# Patient Record
Sex: Male | Born: 1945 | Race: White | Hispanic: No | Marital: Married | Smoking: Light tobacco smoker
Health system: Southern US, Community
[De-identification: ages and names within clinical notes are randomized; demographics above are authoritative.]

## PROBLEM LIST (undated history)

## (undated) DIAGNOSIS — K3189 Other diseases of stomach and duodenum: Secondary | ICD-10-CM

## (undated) DIAGNOSIS — F32A Depression, unspecified: Secondary | ICD-10-CM

## (undated) DIAGNOSIS — R413 Other amnesia: Secondary | ICD-10-CM

## (undated) DIAGNOSIS — H6690 Otitis media, unspecified, unspecified ear: Secondary | ICD-10-CM

## (undated) DIAGNOSIS — C61 Malignant neoplasm of prostate: Secondary | ICD-10-CM

## (undated) DIAGNOSIS — J309 Allergic rhinitis, unspecified: Secondary | ICD-10-CM

## (undated) DIAGNOSIS — F329 Major depressive disorder, single episode, unspecified: Secondary | ICD-10-CM

## (undated) DIAGNOSIS — R1013 Epigastric pain: Secondary | ICD-10-CM

## (undated) DIAGNOSIS — Z8719 Personal history of other diseases of the digestive system: Secondary | ICD-10-CM

## (undated) DIAGNOSIS — I359 Nonrheumatic aortic valve disorder, unspecified: Secondary | ICD-10-CM

## (undated) DIAGNOSIS — E162 Hypoglycemia, unspecified: Secondary | ICD-10-CM

## (undated) DIAGNOSIS — K219 Gastro-esophageal reflux disease without esophagitis: Secondary | ICD-10-CM

## (undated) DIAGNOSIS — I350 Nonrheumatic aortic (valve) stenosis: Secondary | ICD-10-CM

## (undated) DIAGNOSIS — F419 Anxiety disorder, unspecified: Secondary | ICD-10-CM

## (undated) DIAGNOSIS — D649 Anemia, unspecified: Secondary | ICD-10-CM

## (undated) DIAGNOSIS — L299 Pruritus, unspecified: Secondary | ICD-10-CM

## (undated) DIAGNOSIS — K5900 Constipation, unspecified: Secondary | ICD-10-CM

## (undated) DIAGNOSIS — M4800 Spinal stenosis, site unspecified: Secondary | ICD-10-CM

## (undated) DIAGNOSIS — C801 Malignant (primary) neoplasm, unspecified: Secondary | ICD-10-CM

## (undated) HISTORY — DX: Pruritus, unspecified: L29.9

## (undated) HISTORY — DX: Other amnesia: R41.3

## (undated) HISTORY — DX: Anxiety disorder, unspecified: F41.9

## (undated) HISTORY — DX: Other diseases of stomach and duodenum: K31.89

## (undated) HISTORY — DX: Spinal stenosis, site unspecified: M48.00

## (undated) HISTORY — DX: Hypoglycemia, unspecified: E16.2

## (undated) HISTORY — DX: Gastro-esophageal reflux disease without esophagitis: K21.9

## (undated) HISTORY — DX: Allergic rhinitis, unspecified: J30.9

## (undated) HISTORY — PX: PROSTATE SURGERY: SHX751

## (undated) HISTORY — DX: Constipation, unspecified: K59.00

## (undated) HISTORY — DX: Major depressive disorder, single episode, unspecified: F32.9

## (undated) HISTORY — DX: Nonrheumatic aortic valve disorder, unspecified: I35.9

## (undated) HISTORY — DX: Depression, unspecified: F32.A

## (undated) HISTORY — DX: Epigastric pain: R10.13

## (undated) HISTORY — DX: Otitis media, unspecified, unspecified ear: H66.90

---

## 2002-02-26 ENCOUNTER — Encounter: Payer: Self-pay | Admitting: Urology

## 2002-02-26 ENCOUNTER — Encounter: Admission: RE | Admit: 2002-02-26 | Discharge: 2002-02-26 | Payer: Self-pay | Admitting: Urology

## 2006-01-12 ENCOUNTER — Encounter: Admission: RE | Admit: 2006-01-12 | Discharge: 2006-01-12 | Payer: Self-pay | Admitting: Urology

## 2007-09-18 ENCOUNTER — Ambulatory Visit (HOSPITAL_COMMUNITY): Admission: RE | Admit: 2007-09-18 | Discharge: 2007-09-18 | Payer: Self-pay | Admitting: Urology

## 2008-09-17 ENCOUNTER — Ambulatory Visit (HOSPITAL_COMMUNITY): Admission: RE | Admit: 2008-09-17 | Discharge: 2008-09-17 | Payer: Self-pay | Admitting: Urology

## 2008-10-18 ENCOUNTER — Ambulatory Visit: Payer: Self-pay | Admitting: Diagnostic Radiology

## 2008-10-18 ENCOUNTER — Emergency Department (HOSPITAL_BASED_OUTPATIENT_CLINIC_OR_DEPARTMENT_OTHER): Admission: EM | Admit: 2008-10-18 | Discharge: 2008-10-18 | Payer: Self-pay | Admitting: Emergency Medicine

## 2009-06-06 ENCOUNTER — Emergency Department (HOSPITAL_BASED_OUTPATIENT_CLINIC_OR_DEPARTMENT_OTHER): Admission: EM | Admit: 2009-06-06 | Discharge: 2009-06-06 | Payer: Self-pay | Admitting: Emergency Medicine

## 2009-06-06 ENCOUNTER — Ambulatory Visit: Payer: Self-pay | Admitting: Diagnostic Radiology

## 2010-01-27 ENCOUNTER — Encounter
Admission: RE | Admit: 2010-01-27 | Discharge: 2010-01-27 | Payer: Self-pay | Source: Home / Self Care | Attending: Family Medicine | Admitting: Family Medicine

## 2010-05-03 LAB — POCT CARDIAC MARKERS
Myoglobin, poc: 52 ng/mL (ref 12–200)
Troponin i, poc: <0.05 ng/mL (ref 0.00–0.09)

## 2010-05-03 LAB — CBC
HCT: 36.4 % — ABNORMAL LOW (ref 39.0–52.0)
MCV: 92.5 fL (ref 78.0–100.0)
Platelets: 201 10*3/uL (ref 150–400)
WBC: 7.8 10*3/uL (ref 4.0–10.5)

## 2010-05-03 LAB — BASIC METABOLIC PANEL
BUN: 16 mg/dL (ref 6–23)
CO2: 26 mEq/L (ref 19–32)
Chloride: 106 mEq/L (ref 96–112)
Glucose, Bld: 99 mg/dL (ref 70–99)
Potassium: 4.2 mEq/L (ref 3.5–5.1)

## 2010-05-03 LAB — DIFFERENTIAL
Eosinophils Absolute: 0.2 10*3/uL (ref 0.0–0.7)
Eosinophils Relative: 2 % (ref 0–5)
Lymphs Abs: 1.8 10*3/uL (ref 0.7–4.0)

## 2010-05-20 LAB — DIFFERENTIAL
Basophils Relative: 0 % (ref 0–1)
Eosinophils Absolute: 0 10*3/uL (ref 0.0–0.7)
Lymphs Abs: 1.4 10*3/uL (ref 0.7–4.0)
Monocytes Absolute: 0.6 10*3/uL (ref 0.1–1.0)
Monocytes Relative: 7 % (ref 3–12)
Neutro Abs: 6.3 10*3/uL (ref 1.7–7.7)

## 2010-05-20 LAB — CBC
Platelets: 205 10*3/uL (ref 150–400)
RDW: 12 % (ref 11.5–15.5)

## 2010-05-20 LAB — COMPREHENSIVE METABOLIC PANEL
ALT: 32 U/L (ref 0–53)
Albumin: 4.5 g/dL (ref 3.5–5.2)
Alkaline Phosphatase: 117 U/L (ref 39–117)
Potassium: 4 mEq/L (ref 3.5–5.1)
Sodium: 141 mEq/L (ref 135–145)
Total Protein: 7.5 g/dL (ref 6.0–8.3)

## 2010-05-20 LAB — URINALYSIS, ROUTINE W REFLEX MICROSCOPIC
Ketones, ur: NEGATIVE mg/dL
Nitrite: NEGATIVE
Protein, ur: NEGATIVE mg/dL
Urobilinogen, UA: 0.2 mg/dL (ref 0.0–1.0)

## 2010-05-22 LAB — CREATININE, SERUM: Creatinine, Ser: 0.83 mg/dL (ref 0.4–1.5)

## 2010-06-08 ENCOUNTER — Other Ambulatory Visit: Payer: Self-pay | Admitting: Urology

## 2010-06-08 DIAGNOSIS — C61 Malignant neoplasm of prostate: Secondary | ICD-10-CM

## 2010-06-23 ENCOUNTER — Encounter (HOSPITAL_COMMUNITY)
Admission: RE | Admit: 2010-06-23 | Discharge: 2010-06-23 | Disposition: A | Discharge status: Home / Self Care | Payer: Medicare Other | Source: Ambulatory Visit | Attending: Urology | Admitting: Urology

## 2010-06-23 ENCOUNTER — Encounter (HOSPITAL_COMMUNITY): Payer: Self-pay

## 2010-06-23 DIAGNOSIS — C61 Malignant neoplasm of prostate: Secondary | ICD-10-CM | POA: Insufficient documentation

## 2010-06-23 DIAGNOSIS — R972 Elevated prostate specific antigen [PSA]: Secondary | ICD-10-CM | POA: Insufficient documentation

## 2010-06-23 DIAGNOSIS — R32 Unspecified urinary incontinence: Secondary | ICD-10-CM | POA: Insufficient documentation

## 2010-06-23 HISTORY — DX: Malignant (primary) neoplasm, unspecified: C80.1

## 2010-06-23 MED ORDER — TECHNETIUM TC 99M MEDRONATE IV KIT
23.9000 | PACK | Freq: Once | INTRAVENOUS | Status: AC | PRN
  Administered 2010-06-23: 23.9 via INTRAVENOUS

## 2011-04-14 ENCOUNTER — Other Ambulatory Visit: Payer: Self-pay | Admitting: Urology

## 2011-04-14 DIAGNOSIS — C61 Malignant neoplasm of prostate: Secondary | ICD-10-CM

## 2011-05-11 ENCOUNTER — Encounter (HOSPITAL_COMMUNITY)
Admission: RE | Admit: 2011-05-11 | Discharge: 2011-05-11 | Disposition: A | Discharge status: Home / Self Care | Payer: Medicare Other | Source: Ambulatory Visit | Attending: Urology | Admitting: Urology

## 2011-05-11 DIAGNOSIS — C61 Malignant neoplasm of prostate: Secondary | ICD-10-CM

## 2011-05-11 MED ORDER — TECHNETIUM TC 99M MEDRONATE IV KIT
25.0000 | PACK | Freq: Once | INTRAVENOUS | Status: AC | PRN
  Administered 2011-05-11: 25 via INTRAVENOUS

## 2012-01-04 ENCOUNTER — Other Ambulatory Visit: Payer: Self-pay | Admitting: Urology

## 2012-01-04 DIAGNOSIS — C61 Malignant neoplasm of prostate: Secondary | ICD-10-CM

## 2012-02-29 ENCOUNTER — Encounter (HOSPITAL_COMMUNITY)
Admission: RE | Admit: 2012-02-29 | Discharge: 2012-02-29 | Disposition: A | Discharge status: Home / Self Care | Payer: Medicare Other | Source: Ambulatory Visit | Attending: Urology | Admitting: Urology

## 2012-02-29 DIAGNOSIS — C61 Malignant neoplasm of prostate: Secondary | ICD-10-CM | POA: Insufficient documentation

## 2012-02-29 MED ORDER — TECHNETIUM TC 99M MEDRONATE IV KIT
25.2000 | PACK | Freq: Once | INTRAVENOUS | Status: AC | PRN
  Administered 2012-02-29: 25.2 via INTRAVENOUS

## 2012-12-03 ENCOUNTER — Other Ambulatory Visit: Payer: Self-pay | Admitting: Family Medicine

## 2012-12-03 ENCOUNTER — Encounter: Payer: Self-pay | Admitting: Interventional Cardiology

## 2012-12-03 ENCOUNTER — Encounter: Payer: Self-pay | Admitting: *Deleted

## 2012-12-03 DIAGNOSIS — F32A Depression, unspecified: Secondary | ICD-10-CM | POA: Insufficient documentation

## 2012-12-03 DIAGNOSIS — Z0389 Encounter for observation for other suspected diseases and conditions ruled out: Secondary | ICD-10-CM

## 2012-12-03 DIAGNOSIS — K219 Gastro-esophageal reflux disease without esophagitis: Secondary | ICD-10-CM | POA: Insufficient documentation

## 2012-12-03 DIAGNOSIS — C801 Malignant (primary) neoplasm, unspecified: Secondary | ICD-10-CM | POA: Insufficient documentation

## 2012-12-03 DIAGNOSIS — H538 Other visual disturbances: Secondary | ICD-10-CM

## 2012-12-03 DIAGNOSIS — H6690 Otitis media, unspecified, unspecified ear: Secondary | ICD-10-CM | POA: Insufficient documentation

## 2012-12-03 DIAGNOSIS — F329 Major depressive disorder, single episode, unspecified: Secondary | ICD-10-CM | POA: Insufficient documentation

## 2012-12-03 DIAGNOSIS — E162 Hypoglycemia, unspecified: Secondary | ICD-10-CM | POA: Insufficient documentation

## 2012-12-05 ENCOUNTER — Ambulatory Visit (INDEPENDENT_AMBULATORY_CARE_PROVIDER_SITE_OTHER): Payer: Medicare Other | Admitting: Interventional Cardiology

## 2012-12-05 ENCOUNTER — Encounter: Payer: Self-pay | Admitting: Interventional Cardiology

## 2012-12-05 VITALS — BP 128/82 | HR 68 | Ht 74.0 in | Wt 236.0 lb

## 2012-12-05 DIAGNOSIS — I359 Nonrheumatic aortic valve disorder, unspecified: Secondary | ICD-10-CM | POA: Insufficient documentation

## 2012-12-05 DIAGNOSIS — F05 Delirium due to known physiological condition: Secondary | ICD-10-CM

## 2012-12-05 DIAGNOSIS — I6529 Occlusion and stenosis of unspecified carotid artery: Secondary | ICD-10-CM | POA: Insufficient documentation

## 2012-12-05 DIAGNOSIS — Z0389 Encounter for observation for other suspected diseases and conditions ruled out: Secondary | ICD-10-CM

## 2012-12-05 NOTE — Patient Instructions (Addendum)
Your physician has requested that you have an echocardiogram. Echocardiography is a painless test that uses sound waves to create images of your heart. It provides your doctor with information about the size and shape of your heart and how well your heart's chambers and valves are working. This procedure takes approximately one hour. There are no restrictions for this procedure.  Your physician recommends that you schedule a follow-up appointment in: 3-4 weeks with Dr. Eldridge Dace.  Your physician recommends that you continue on your current medications as directed. Please refer to the Current Medication list given to you today.

## 2012-12-05 NOTE — Progress Notes (Signed)
Patient ID: Logan Wolfe, male   DOB: Jan 12, 1946, 67 y.o.   MRN: [id number]    [address] [place], [place]  [address] Phone: [phone] Fax:  [phone]  Date:  12/05/2012   ID:  Logan Wolfe, DOB 14-Sep-1945, MRN [id number]  PCP:   Duane Lope, MD      History of Present Illness: Logan Wolfe is a 67 y.o. male who had transient confusion for 15 minutes.  He could not read certain words.  He had trouble spelling certain words as well.  He saw Dr. Tenny Craw who is sending him for MRI of his brain.  No SHOB, CP, dizziness.  He does have an occasional burning in his chest.  Episodes are shortlived and terminate spontaneously.  His back an hips limit his walking.  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  He lifts boxes and moves inventroy and has no problems with that other than joint and back pains.     Wt Readings from Last 3 Encounters:  12/05/12 236 lb (107.049 kg)     Past Medical History  Diagnosis Date  . Cancer     Prostate CA, Dr Annabell Howells 22 years ago  . GERD (gastroesophageal reflux disease)   . Hypoglycemia   . Depressive disorder   . Otitis     externa of right ear    Current Outpatient Prescriptions  Medication Sig Dispense Refill  . aspirin 81 MG tablet Take 81 mg by mouth daily.      . clopidogrel (PLAVIX) 75 MG tablet Take 75 mg by mouth daily.      . fluticasone (FLONASE) 50 MCG/ACT nasal spray Place into the nose daily.      . hyoscyamine (ANASPAZ) 0.125 MG TBDP tablet Place 0.125 mg under the tongue every 4 (four) hours as needed.      Marland Kitchen ibuprofen (ADVIL,MOTRIN) 200 MG tablet Take 200 mg by mouth every 6 (six) hours as needed for pain.      Marland Kitchen LORazepam (ATIVAN) 1 MG tablet Take 1 mg by mouth every 6 (six) hours as needed for anxiety.      . polyethylene glycol (MIRALAX) packet Take 17 g by mouth daily.       No current facility-administered medications for this visit.    Allergies:    Allergies  Allergen Reactions  . Penicillins      Social History:  The patient  reports that he has quit smoking. His smoking use included Cigarettes. He smoked 0.00 packs per day for 15 years. He does not have any smokeless tobacco history on file.   Family History:  The patient's family history includes Cancer in his father; Diabetes in his mother; Hyperlipidemia in his mother.   ROS:  Please see the history of present illness.  No nausea, vomiting.  No fevers, chills.  No focal weakness.  No dysuria.   All other systems reviewed and negative.   PHYSICAL EXAM: VS:  BP 128/82  Pulse 68  Ht 6\' 2"  (1.88 m)  Wt 236 lb (107.049 kg)  BMI 30.29 kg/m2 Well nourished, well developed, in no acute distress HEENT: normal Neck: no JVD, no carotid bruits Cardiac:  normal S1, S2; RRR; 2/6 early systolic murmur Lungs:  clear to auscultation bilaterally, no wheezing, rhonchi or rales Abd: soft, nontender, no hepatomegaly Ext: no edema Skin: warm and dry Neuro:   no focal abnormalities noted  EKG:  Normal   ASSESSMENT AND PLAN:  1. Aortic stenosis: Mild by prior echocardiogram. Will recheck echo to further evaluate. 2. Transient confusion: Certainly is consistent with some type of neurologic event. If MRI shows evidence of embolism to multiple areas of the brain, would strongly consider transesophageal echocardiogram. 3. Carotid artery disease:   Reviewed carotid Doppler done in 2012. There is mild plaque bilaterally. This seems like an unlikely cause for his TIA symptoms. Will go over results of his MRI and echocardiogram in about 4 weeks.                                                                                                                                                                                                                                                                                                                                                                                                                                                                                         Signed, Fredric Mare, MD, [facility] 12/05/2012 1:01 PM

## 2012-12-12 ENCOUNTER — Ambulatory Visit
Admission: RE | Admit: 2012-12-12 | Discharge: 2012-12-12 | Disposition: A | Discharge status: Home / Self Care | Payer: Medicare Other | Source: Ambulatory Visit | Attending: Family Medicine | Admitting: Family Medicine

## 2012-12-12 DIAGNOSIS — Z0389 Encounter for observation for other suspected diseases and conditions ruled out: Secondary | ICD-10-CM

## 2012-12-12 DIAGNOSIS — H538 Other visual disturbances: Secondary | ICD-10-CM

## 2012-12-20 ENCOUNTER — Other Ambulatory Visit: Payer: Self-pay

## 2012-12-20 ENCOUNTER — Ambulatory Visit (HOSPITAL_COMMUNITY): Payer: Medicare Other | Attending: Cardiology

## 2012-12-20 ENCOUNTER — Other Ambulatory Visit (HOSPITAL_COMMUNITY): Payer: Self-pay | Admitting: Interventional Cardiology

## 2012-12-20 DIAGNOSIS — I359 Nonrheumatic aortic valve disorder, unspecified: Secondary | ICD-10-CM

## 2012-12-24 ENCOUNTER — Telehealth: Payer: Self-pay | Admitting: Interventional Cardiology

## 2012-12-24 NOTE — Telephone Encounter (Signed)
rtc pt call

## 2012-12-24 NOTE — Telephone Encounter (Signed)
New Problem:  Pt states he is returning a phone call. Pt states the person didn't leave their name or any details... Pt would like to know if his nurse was calling him. Please advise

## 2012-12-26 ENCOUNTER — Ambulatory Visit (INDEPENDENT_AMBULATORY_CARE_PROVIDER_SITE_OTHER): Payer: Medicare Other | Admitting: Neurology

## 2012-12-26 ENCOUNTER — Encounter: Payer: Self-pay | Admitting: Neurology

## 2012-12-26 VITALS — BP 124/82 | HR 66 | Ht 74.5 in | Wt 240.0 lb

## 2012-12-26 DIAGNOSIS — G459 Transient cerebral ischemic attack, unspecified: Secondary | ICD-10-CM

## 2012-12-26 NOTE — Patient Instructions (Signed)
Overall you are doing fairly well but I do want to suggest a few things today:   Remember to drink plenty of fluid, eat healthy meals and do not skip any meals. Try to eat protein with a every meal and eat a healthy snack such as fruit or nuts in between meals. Try to keep a regular sleep-wake schedule and try to exercise daily, particularly in the form of walking, 20-30 minutes a day, if you can.   As far as your medications are concerned, I would like to suggest you stay on the aspirin 325mg  daily.   As far as diagnostic testing:  1)We will check your lipids and cholesterol today  We will follow up as needed. I will call to let you know the results of your blood work.   My clinical assistant and will answer any of your questions and relay your messages to me and also relay most of my messages to you.   Our phone number is [phone]. We also have an after hours call service for urgent matters and there is a physician on-call for urgent questions. For any emergencies you know to call 911 or go to the nearest emergency room

## 2012-12-26 NOTE — Progress Notes (Signed)
[facility]    Provider:  Dr Hosie Poisson Referring Provider: Duane Lope, MD Primary Care Physician:   Logan Lope, MD  CC:  Confusion and blurred vision  HPI:  Logan Wolfe is a 67 y.o. male here as a referral from Dr. Tenny Wolfe for evaluation of transient episode of blurred vision and confusion  In October, noted acute onset of confusion, couldn't remember how to do things, unable to think of words, word finding difficulties, trouble getting words out, kept misspelling words, made simple mistakes while working. He knew something was not right. Lasted around 15 minutes. No witnesses to the event.   Has occasional episodes of blurry vision, happens every few months, notes a central vision blurring. This is not followed by a headache. Lasts a few minutes. Believes it is binocular but is unsure what happens if he closes one eye.    No other histories of blacking out, zoning out, no noted automatisms. Feels short term memory is poor but has been a chronic problem, no progression.   No known HTN, DM.   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   Review of Systems: Out of a complete 14 system review, the patient complains of only the following symptoms, and all other reviewed systems are negative. Positive for memory loss confusion numbness depression anxiety allergies itching ringing in ears  History   Social History  . Marital Status: Married    Spouse Name: Logan Wolfe    Number of Children: 2  . Years of Education: 13   Occupational History  .     Social History Main Topics  . Smoking status: Light Tobacco Smoker -- 15 years    Types: Cigars  . Smokeless tobacco: Never Used  . Alcohol Use: Yes  . Drug Use: No  . Sexual Activity: Yes    Partners: Female   Other Topics Concern  . Not on file   Social History Narrative   Patient is married Logan Wolfe) and lives with his wife.   Patient has  2 children and his wife has 2 children.   Patient is semi-retired.   Patient has a college education.   Patient is right-handed.   Patient drinks one cup of coffee daily.    Family History  Problem Relation Age of Onset  . Hyperlipidemia Mother   . Diabetes Mother   . Cancer Father     facial    Past Medical History  Diagnosis Date  . Cancer     Prostate CA, Dr Logan Wolfe 22 years ago  . GERD (gastroesophageal reflux disease)   . Hypoglycemia   . Depressive disorder   . Otitis     externa of right ear  . Anxiety   . Constipation   . Spinal stenosis   . Aortic valve calcification   . Unspecified pruritic disorder   . Dyspepsia and other specified disorders of function of stomach   . Allergic rhinitis, cause unspecified   . Memory loss     Past Surgical History  Procedure Laterality Date  . Prostate surgery      Current Outpatient Prescriptions  Medication Sig Dispense Refill  . aspirin 325 MG tablet Take 325 mg by mouth daily.      . fluticasone (FLONASE) 50 MCG/ACT nasal spray Place into the nose daily.      . hyoscyamine (ANASPAZ) 0.125 MG TBDP tablet Place 0.125 mg under the tongue every 4 (four) hours as  needed.      Marland Kitchen ibuprofen (ADVIL,MOTRIN) 200 MG tablet Take 200 mg by mouth every 6 (six) hours as needed for pain.      Marland Kitchen LORazepam (ATIVAN) 1 MG tablet Take 1 mg by mouth every 6 (six) hours as needed for anxiety.      . polyethylene glycol (MIRALAX) packet Take 17 g by mouth daily.       No current facility-administered medications for this visit.    Allergies as of 12/26/2012 - Review Complete 12/26/2012  Allergen Reaction Noted  . Penicillins  06/23/2010    Vitals: BP 124/82  Pulse 66  Ht 6' 2.5" (1.892 m)  Wt 240 lb (108.863 kg)  BMI 30.41 kg/m2 Last Weight:  Wt Readings from Last 1 Encounters:  12/26/12 240 lb (108.863 kg)   Last Height:   Ht Readings from Last 1 Encounters:  12/26/12 6' 2.5" (1.892 m)     Physical exam: Exam: Gen: NAD,  conversant Eyes: anicteric sclerae, moist conjunctivae HENT: Atraumatic, oropharynx clear Neck: Trachea midline; supple,  Lungs: CTA, no wheezing, rales, rhonic                          CV: RRR, no MRG, no carotid bruits Abdomen: Soft, non-tender;  Extremities: No peripheral edema  Skin: Normal temperature, no rash,  Psych: Appropriate affect, pleasant  Neuro: MS: AA&Ox3, appropriately interactive, normal affect   Speech: fluent w/o paraphasic error  Memory: good recent and remote recall  CN: PERRL, EOMI no nystagmus, no ptosis, sensation intact to LT V1-V3 bilat, face symmetric, no weakness, hearing grossly intact, palate elevates symmetrically, shoulder shrug 5/5 bilat,  tongue protrudes midline, no fasiculations noted.  Motor: normal bulk and tone Strength: 5/5  In all extremities, did not test proximal right upper extremity due to rotator cuff injury  Coord: rapid alternating and point-to-point (FNF, HTS) movements intact, slowed on the right upper extremity do to rotator cuff injury   Reflexes: symmetrical, bilat downgoing toes  Sens: LT intact in all extremities  Gait: posture, stance, stride and arm-swing normal.    Assessment:  After physical and neurologic examination, review of laboratory studies, imaging, neurophysiology testing and pre-existing records, assessment will be reviewed on the problem list.  Plan:  Treatment plan and additional workup will be reviewed under Problem List.  1)Altered mental status  67 year old gentleman and pain for initial evaluation of transient episode of confusion, word finding difficulties. This lasted less than 15 minutes. Has been asymptomatic since then. Also notes brief intermittent episodes of decreased visual acuity with central blurring of bilateral vision. Unclear etiology symptoms. 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 Based on clinical history  would have concern this event represents a transient ischemic attack. Differential would also include atypical migraines and or complex partial seizure. Will check lipid panel. Will continue patient on aspirin 325 mg daily. Will hold off on EEG at this time due to low suspicion of seizure episode. Patient to AS needed

## 2012-12-27 ENCOUNTER — Encounter: Payer: Self-pay | Admitting: Interventional Cardiology

## 2012-12-27 ENCOUNTER — Ambulatory Visit (INDEPENDENT_AMBULATORY_CARE_PROVIDER_SITE_OTHER): Payer: Medicare Other | Admitting: Interventional Cardiology

## 2012-12-27 ENCOUNTER — Other Ambulatory Visit (INDEPENDENT_AMBULATORY_CARE_PROVIDER_SITE_OTHER): Payer: Self-pay

## 2012-12-27 VITALS — BP 130/84 | HR 72 | Ht 74.5 in | Wt 241.0 lb

## 2012-12-27 DIAGNOSIS — I359 Nonrheumatic aortic valve disorder, unspecified: Secondary | ICD-10-CM

## 2012-12-27 DIAGNOSIS — Z01810 Encounter for preprocedural cardiovascular examination: Secondary | ICD-10-CM

## 2012-12-27 DIAGNOSIS — Z0289 Encounter for other administrative examinations: Secondary | ICD-10-CM

## 2012-12-27 DIAGNOSIS — I6789 Other cerebrovascular disease: Secondary | ICD-10-CM | POA: Insufficient documentation

## 2012-12-27 NOTE — Progress Notes (Signed)
Patient ID: Logan Wolfe, male   DOB: November 30, 1945, 67 y.o.   MRN: [id number] Patient ID: Logan Wolfe, male   DOB: 07-26-1945, 67 y.o.   MRN: [id number]    [address] [place], [place]  [address] Phone: [phone] Fax:  [phone]  Date:  12/27/2012   ID:  Logan Wolfe, DOB 1945-12-19, MRN [id number]  PCP:   Duane Lope, MD      History of Present Illness: Logan Wolfe is a 67 y.o. male who had transient confusion for 15 minutes.  He could not read certain words.  He had trouble spelling certain words as well.  He saw Dr. Tenny Craw who is sending him for MRI of his brain.  No SHOB, CP, dizziness.  He does have an occasional burning in his chest.  Episodes are shortlived and terminate spontaneously.  His back an hips limit his walking.  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  He lifts boxes and moves inventroy and has no problems with that other than joint and back pains.    He had an MRI scan.  This did not show evidence of recent embolic phenomenon.   Wt Readings from Last 3 Encounters:  12/27/12 241 lb (109.317 kg)  12/26/12 240 lb (108.863 kg)  12/05/12 236 lb (107.049 kg)     Past Medical History  Diagnosis Date  . Cancer     Prostate CA, Dr Annabell Howells 22 years ago  . GERD (gastroesophageal reflux disease)   . Hypoglycemia   . Depressive disorder   . Otitis     externa of right ear  . Anxiety   . Constipation   . Spinal stenosis   . Aortic valve calcification   . Unspecified pruritic disorder   . Dyspepsia and other specified disorders of function of stomach   . Allergic rhinitis, cause unspecified   . Memory loss     Current Outpatient Prescriptions  Medication Sig Dispense Refill  . aspirin 325 MG tablet Take 325 mg by mouth daily.      . fluticasone (FLONASE) 50 MCG/ACT nasal spray Place into the nose daily.      . hyoscyamine (ANASPAZ) 0.125 MG TBDP tablet Place 0.125 mg under the tongue every 4 (four) hours as needed.      Marland Kitchen ibuprofen  (ADVIL,MOTRIN) 200 MG tablet Take 200 mg by mouth every 6 (six) hours as needed for pain.      Marland Kitchen LORazepam (ATIVAN) 1 MG tablet Take 1 mg by mouth every 6 (six) hours as needed for anxiety.      . polyethylene glycol (MIRALAX) packet Take 17 g by mouth daily.       No current facility-administered medications for this visit.    Allergies:    Allergies  Allergen Reactions  . Penicillins     Social History:  The patient  reports that he has been smoking Cigars.  He has never used smokeless tobacco. He reports that he drinks alcohol. He reports that he does not use illicit drugs.   Family History:  The patient's family history includes Cancer in his father; Diabetes in his mother; Hyperlipidemia in his mother.   ROS:  Please see the history of present illness.  No nausea, vomiting.  No fevers, chills.  No focal weakness.  No dysuria.   All other systems reviewed and negative.   PHYSICAL EXAM: VS:  BP 130/84  Pulse 72  Ht 6' 2.5" (1.892 m)  Wt 241 lb (109.317 kg)  BMI 30.54 kg/m2 Well nourished, well developed, in no acute distress HEENT: normal Neck: no JVD, no carotid bruits Cardiac:  normal S1, S2; RRR; 2/6 early systolic murmur Lungs:  clear to auscultation bilaterally, no wheezing, rhonchi or rales Abd: soft, nontender, no hepatomegaly Ext: no edema Skin: warm and dry Neuro:   no focal abnormalities noted  EKG:  Normal   ASSESSMENT AND PLAN:  1. Aortic stenosis: Mild by prior echocardiogram, although leaflet movement looked more restricted.  Follow with serial echocardiograms. 2. Transient confusion: Certainly is consistent with some type of neurologic event.  MRI showed no evidence of embolism to multiple areas of the brain; would not consider transesophageal echocardiogram. 3. Carotid artery disease:   Reviewed carotid Doppler done in 2012. There is mild plaque bilaterally. This seems like an unlikely cause for his TIA symptoms. Went over results of his MRI and  echocardiogram.   Prior cerebellar infarct. 4. Preoperative cardiovascular evaluation:  He has had normal stress tests in the past; prior to hernia surgery.  He is very active and walks up stairs frequently without problems.  No further cardiac testing needed prior to shoulder surgery.  Signed, Fredric Mare, MD, [facility] 12/27/2012 10:27 AM

## 2012-12-27 NOTE — Patient Instructions (Signed)
Your physician recommends that you continue on your current medications as directed. Please refer to the Current Medication list given to you today.  Your physician wants you to follow-up in: 1 year with Dr. Varanasi. You will receive a reminder letter in the mail two months in advance. If you don't receive a letter, please call our office to schedule the follow-up appointment.  

## 2012-12-28 LAB — LIPID PANEL
Chol/HDL Ratio: 3.8 ratio units (ref 0.0–5.0)
HDL: 56 mg/dL (ref >39)
VLDL Cholesterol Cal: 28 mg/dL (ref 5–40)

## 2013-02-27 ENCOUNTER — Ambulatory Visit: Payer: Medicare Other | Attending: Orthopedic Surgery | Admitting: Physical Therapy

## 2013-02-27 DIAGNOSIS — M51379 Other intervertebral disc degeneration, lumbosacral region 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 lumbar back pain or lower extremity pain: Secondary | ICD-10-CM | POA: Insufficient documentation

## 2013-02-27 DIAGNOSIS — IMO0001 Reserved for inherently not codable concepts without codable children: Secondary | ICD-10-CM | POA: Insufficient documentation

## 2013-02-27 DIAGNOSIS — M25519 Pain in unspecified shoulder: Secondary | ICD-10-CM | POA: Insufficient documentation

## 2013-02-27 DIAGNOSIS — M5137 Other intervertebral disc degeneration, lumbosacral region: Secondary | ICD-10-CM | POA: Insufficient documentation

## 2013-03-06 ENCOUNTER — Ambulatory Visit: Payer: Medicare Other | Admitting: Physical Therapy

## 2013-03-12 ENCOUNTER — Ambulatory Visit: Payer: Medicare Other | Admitting: Physical Therapy

## 2013-03-20 ENCOUNTER — Ambulatory Visit: Payer: Medicare Other | Admitting: Physical Therapy

## 2013-05-15 ENCOUNTER — Encounter: Payer: Self-pay | Admitting: Cardiology

## 2013-05-29 ENCOUNTER — Encounter: Payer: Self-pay | Admitting: Cardiology

## 2013-06-10 ENCOUNTER — Encounter: Payer: Self-pay | Admitting: Cardiology

## 2013-11-25 ENCOUNTER — Other Ambulatory Visit: Payer: Self-pay | Admitting: Urology

## 2013-11-25 DIAGNOSIS — C61 Malignant neoplasm of prostate: Secondary | ICD-10-CM

## 2013-12-02 ENCOUNTER — Encounter (HOSPITAL_COMMUNITY)
Admission: RE | Admit: 2013-12-02 | Discharge: 2013-12-02 | Disposition: A | Discharge status: Home / Self Care | Payer: Medicare Other | Source: Ambulatory Visit | Attending: Urology | Admitting: Urology

## 2013-12-02 DIAGNOSIS — C61 Malignant neoplasm of prostate: Secondary | ICD-10-CM | POA: Diagnosis present

## 2013-12-02 MED ORDER — TECHNETIUM TC 99M MEDRONATE IV KIT: 27.5000 | PACK | Freq: Once | INTRAVENOUS | Status: AC | PRN

## 2014-01-09 DIAGNOSIS — R350 Frequency of micturition: Secondary | ICD-10-CM | POA: Insufficient documentation

## 2014-01-13 ENCOUNTER — Ambulatory Visit
Admission: RE | Admit: 2014-01-13 | Discharge: 2014-01-13 | Disposition: A | Discharge status: Home / Self Care | Payer: Medicare Other | Source: Ambulatory Visit | Attending: Family Medicine | Admitting: Family Medicine

## 2014-01-13 ENCOUNTER — Other Ambulatory Visit: Payer: Self-pay | Admitting: Family Medicine

## 2014-01-13 DIAGNOSIS — M549 Dorsalgia, unspecified: Secondary | ICD-10-CM

## 2014-02-23 ENCOUNTER — Encounter: Payer: Self-pay | Admitting: Interventional Cardiology

## 2014-02-23 ENCOUNTER — Ambulatory Visit (INDEPENDENT_AMBULATORY_CARE_PROVIDER_SITE_OTHER): Payer: Medicare Other | Admitting: Interventional Cardiology

## 2014-02-23 VITALS — BP 127/80 | HR 65 | Ht 74.0 in | Wt 240.0 lb

## 2014-02-23 DIAGNOSIS — I350 Nonrheumatic aortic (valve) stenosis: Secondary | ICD-10-CM

## 2014-02-23 DIAGNOSIS — I359 Nonrheumatic aortic valve disorder, unspecified: Secondary | ICD-10-CM

## 2014-02-23 NOTE — Progress Notes (Signed)
Patient ID: Logan Wolfe, male   DOB: [birth year], 69 y.o.   MRN: [id number] Patient ID: Logan Wolfe, male   DOB: 1945-12-25, 69 y.o.   MRN: [id number] Patient ID: Logan Wolfe, male   DOB: Nov 25, 1945, 69 y.o.   MRN: [id number]    [address] [place] [place], [place]  [address] Phone: [phone] Fax:  [phone]  Date:  02/23/2014   ID:  Logan Wolfe, DOB [birth year], MRN [id number]  PCP:   Logan Wolfe, Logan Wolfe      History of Present Illness: Logan Wolfe is a 69 y.o. male who had transient confusion for 15 minutes in 2014.  He could not read certain words.  He had trouble spelling certain words as well.  He saw Logan Wolfe who is sending him for MRI of his brain.    No SHOB, CP, dizziness.  He does have an occasional burning in his chest that feels like indigestion.  Not related to exertion.  Episodes are shortlived and terminate spontaneously.  His back an hips limit his walking.  He runs a Engineer, [employer].  He is semiretired.  He lifts boxes and moves inventroy and has no problems with that other than joint and back pains.  No problems with those activities.  He had an MRI scan.  This did not show evidence of recent embolic phenomenon.   No further episodes since that time.   No chest pain, fluid overload, lightheadedness or syncope. He remains active physically.   Wt Readings from Last 3 Encounters:  02/23/14 240 lb (108.863 kg)  12/27/12 241 lb (109.317 kg)  12/26/12 240 lb (108.863 kg)     Past Medical History  Diagnosis Date  . Cancer     Prostate CA, Logan Wolfe 22 years ago  . GERD (gastroesophageal reflux disease)   . Hypoglycemia   . Depressive disorder   . Otitis     externa of right ear  . Anxiety   . Constipation   . Spinal stenosis   . Aortic valve calcification   . Unspecified pruritic disorder   . Dyspepsia and other specified disorders of function of stomach   . Allergic rhinitis, cause unspecified   . Memory loss     Current Outpatient Prescriptions   Medication Sig Dispense Refill  . aspirin 325 MG tablet Take 325 mg by mouth daily.    . fluticasone (FLONASE) 50 MCG/ACT nasal spray Place into the nose daily.    . hyoscyamine (ANASPAZ) 0.125 MG TBDP tablet Place 0.125 mg under the tongue every 4 (four) hours as needed.    Marland Kitchen ibuprofen (ADVIL,MOTRIN) 200 MG tablet Take 200 mg by mouth every 6 (six) hours as needed for pain.    . polyethylene glycol (MIRALAX) packet Take 17 g by mouth daily.     No current facility-administered medications for this visit.    Allergies:    Allergies  Allergen Reactions  . Penicillins     Social History:  The patient  reports that he has been smoking Cigars.  He has never used smokeless tobacco. He reports that he drinks alcohol. He reports that he does not use illicit drugs.   Family History:  The patient's family history includes Cancer in his father; Diabetes in his mother; Hyperlipidemia in his mother.   ROS:  Please see the history of present illness.  No nausea, vomiting.  No fevers, chills.  No focal weakness.  No dysuria.   All other systems  reviewed and negative.   PHYSICAL EXAM: VS:  BP 127/80 mmHg  Pulse 65  Ht 6\' 2"  (1.88 m)  Wt 240 lb (108.863 kg)  BMI 30.80 kg/m2 Well nourished, well developed, in no acute distress HEENT: normal Neck: no JVD, no carotid bruits Cardiac:  normal S1, S2; RRR; 2/6 early systolic murmur Lungs:  clear to auscultation bilaterally, no wheezing, rhonchi or rales Abd: soft, nontender, no hepatomegaly Ext: no edema Skin: warm and dry Neuro:   no focal abnormalities noted  psych: normal affect  EKG:  Normal  Sinus rhythm, LVH, no ST segment changes  ASSESSMENT AND PLAN:  1. Aortic stenosis: Mild by prior echocardiogram, although leaflet movement looked more restricted.  Follow with serial echocardiograms.  Consider repeat echo in late 2017 or early 2018.   We went over the symptoms of severe aortic stenosis and what to watch for. He will let us know if he  has any symptoms. 2. Transient confusion: Certainly is consistent with some type of neurologic event.  MRI showed no evidence of embolism to multiple areas of the brain;   No further episodes since the one in 2014. 3. Carotid artery disease:   Reviewed carotid Doppler done in 2014. There is minimal plaque bilaterally. This seems like an unlikely cause for his TIA symptoms. Went over results of his MRI and echocardiogram.   Prior cerebellar infarct.  Signed, Logan Wolfe, Logan Wolfe, [facility] 02/23/2014 3:51 PM

## 2014-02-23 NOTE — Patient Instructions (Signed)
Your physician recommends that you continue on your current medications as directed. Please refer to the Current Medication list given to you today.  Your physician wants you to follow-up in: 2 years with Dr. Irish Lack and have an Echocardigram done at this time. You will receive a reminder letter in the mail two months in advance. If you don't receive a letter, please call our office to schedule the follow-up appointment.

## 2015-05-06 ENCOUNTER — Other Ambulatory Visit: Payer: Self-pay | Admitting: Family Medicine

## 2015-05-06 DIAGNOSIS — R1084 Generalized abdominal pain: Secondary | ICD-10-CM

## 2015-05-14 ENCOUNTER — Ambulatory Visit
Admission: RE | Admit: 2015-05-14 | Discharge: 2015-05-14 | Disposition: A | Discharge status: Home / Self Care | Payer: Medicare Other | Source: Ambulatory Visit | Attending: Family Medicine | Admitting: Family Medicine

## 2015-05-14 DIAGNOSIS — R1084 Generalized abdominal pain: Secondary | ICD-10-CM

## 2015-12-30 DIAGNOSIS — H6981 Other specified disorders of Eustachian tube, right ear: Secondary | ICD-10-CM | POA: Insufficient documentation

## 2015-12-30 DIAGNOSIS — H6991 Unspecified Eustachian tube disorder, right ear: Secondary | ICD-10-CM | POA: Insufficient documentation

## 2015-12-30 DIAGNOSIS — H905 Unspecified sensorineural hearing loss: Secondary | ICD-10-CM | POA: Insufficient documentation

## 2016-04-06 DIAGNOSIS — H524 Presbyopia: Secondary | ICD-10-CM | POA: Diagnosis not present

## 2016-04-06 DIAGNOSIS — H5203 Hypermetropia, bilateral: Secondary | ICD-10-CM | POA: Diagnosis not present

## 2016-04-06 DIAGNOSIS — H52223 Regular astigmatism, bilateral: Secondary | ICD-10-CM | POA: Diagnosis not present

## 2016-06-22 DIAGNOSIS — C61 Malignant neoplasm of prostate: Secondary | ICD-10-CM | POA: Diagnosis not present

## 2016-06-29 DIAGNOSIS — N393 Stress incontinence (female) (male): Secondary | ICD-10-CM | POA: Diagnosis not present

## 2016-06-29 DIAGNOSIS — R3915 Urgency of urination: Secondary | ICD-10-CM | POA: Diagnosis not present

## 2016-06-29 DIAGNOSIS — C61 Malignant neoplasm of prostate: Secondary | ICD-10-CM | POA: Diagnosis not present

## 2016-11-17 DIAGNOSIS — Z23 Encounter for immunization: Secondary | ICD-10-CM | POA: Diagnosis not present

## 2016-12-28 DIAGNOSIS — N393 Stress incontinence (female) (male): Secondary | ICD-10-CM | POA: Diagnosis not present

## 2016-12-28 DIAGNOSIS — C61 Malignant neoplasm of prostate: Secondary | ICD-10-CM | POA: Diagnosis not present

## 2017-01-10 ENCOUNTER — Other Ambulatory Visit: Payer: Self-pay | Admitting: Urology

## 2017-01-10 DIAGNOSIS — C61 Malignant neoplasm of prostate: Secondary | ICD-10-CM

## 2017-01-26 ENCOUNTER — Ambulatory Visit (HOSPITAL_COMMUNITY)
Admission: RE | Admit: 2017-01-26 | Discharge: 2017-01-26 | Disposition: A | Discharge status: Home / Self Care | Payer: PPO | Source: Ambulatory Visit | Attending: Urology | Admitting: Urology

## 2017-01-26 DIAGNOSIS — C61 Malignant neoplasm of prostate: Secondary | ICD-10-CM | POA: Diagnosis not present

## 2017-02-19 ENCOUNTER — Emergency Department (HOSPITAL_COMMUNITY): Payer: PPO

## 2017-02-19 ENCOUNTER — Other Ambulatory Visit: Payer: Self-pay

## 2017-02-19 ENCOUNTER — Encounter (HOSPITAL_COMMUNITY): Payer: Self-pay

## 2017-02-19 ENCOUNTER — Emergency Department (HOSPITAL_COMMUNITY)
Admission: EM | Admit: 2017-02-19 | Discharge: 2017-02-19 | Disposition: A | Discharge status: Home / Self Care | Payer: PPO | Attending: Emergency Medicine | Admitting: Emergency Medicine

## 2017-02-19 DIAGNOSIS — L299 Pruritus, unspecified: Secondary | ICD-10-CM | POA: Diagnosis not present

## 2017-02-19 DIAGNOSIS — F17290 Nicotine dependence, other tobacco product, uncomplicated: Secondary | ICD-10-CM | POA: Insufficient documentation

## 2017-02-19 DIAGNOSIS — Z8546 Personal history of malignant neoplasm of prostate: Secondary | ICD-10-CM | POA: Insufficient documentation

## 2017-02-19 DIAGNOSIS — Z79899 Other long term (current) drug therapy: Secondary | ICD-10-CM | POA: Insufficient documentation

## 2017-02-19 DIAGNOSIS — Z7982 Long term (current) use of aspirin: Secondary | ICD-10-CM | POA: Insufficient documentation

## 2017-02-19 DIAGNOSIS — L603 Nail dystrophy: Secondary | ICD-10-CM | POA: Diagnosis not present

## 2017-02-19 DIAGNOSIS — R072 Precordial pain: Secondary | ICD-10-CM | POA: Insufficient documentation

## 2017-02-19 DIAGNOSIS — I209 Angina pectoris, unspecified: Secondary | ICD-10-CM | POA: Diagnosis not present

## 2017-02-19 DIAGNOSIS — R079 Chest pain, unspecified: Secondary | ICD-10-CM | POA: Diagnosis not present

## 2017-02-19 DIAGNOSIS — R0602 Shortness of breath: Secondary | ICD-10-CM | POA: Diagnosis not present

## 2017-02-19 LAB — CBC
HCT: 40 % (ref 39.0–52.0)
Hemoglobin: 13.5 g/dL (ref 13.0–17.0)
MCH: 31.8 pg (ref 26.0–34.0)
MCHC: 33.8 g/dL (ref 30.0–36.0)
MCV: 94.3 fL (ref 78.0–100.0)
Platelets: 180 10*3/uL (ref 150–400)
RBC: 4.24 MIL/uL (ref 4.22–5.81)
RDW: 13 % (ref 11.5–15.5)
WBC: 7.1 10*3/uL (ref 4.0–10.5)

## 2017-02-19 LAB — BASIC METABOLIC PANEL
Anion gap: 7 (ref 5–15)
BUN: 15 mg/dL (ref 6–20)
CALCIUM: 9.4 mg/dL (ref 8.9–10.3)
CO2: 24 mmol/L (ref 22–32)
Chloride: 106 mmol/L (ref 101–111)
Creatinine, Ser: 0.93 mg/dL (ref 0.61–1.24)
GFR calc Af Amer: >60 mL/min (ref >60)
GLUCOSE: 104 mg/dL — AB (ref 65–99)
Potassium: 4.2 mmol/L (ref 3.5–5.1)
Sodium: 137 mmol/L (ref 135–145)

## 2017-02-19 LAB — I-STAT TROPONIN, ED
TROPONIN I, POC: 0.01 ng/mL (ref 0.00–0.08)
Troponin i, poc: 0 ng/mL (ref 0.00–0.08)

## 2017-02-19 MED ORDER — NITROGLYCERIN 0.4 MG SL SUBL
0.4000 mg | SUBLINGUAL_TABLET | SUBLINGUAL | Status: DC | PRN
  Administered 2017-02-19: 0.4 mg via SUBLINGUAL
  Filled 2017-02-19: qty 1

## 2017-02-19 MED ORDER — ESOMEPRAZOLE MAGNESIUM 20 MG PO CPDR: 20.0000 mg | DELAYED_RELEASE_CAPSULE | Freq: Every day | ORAL | 3 refills | Status: DC

## 2017-02-19 MED ORDER — RANITIDINE HCL 150 MG/10ML PO SYRP
150.0000 mg | ORAL_SOLUTION | Freq: Once | ORAL | Status: AC
  Administered 2017-02-19: 150 mg via ORAL
  Filled 2017-02-19: qty 10

## 2017-02-19 MED ORDER — ALUM & MAG HYDROXIDE-SIMETH 200-200-20 MG/5ML PO SUSP
30.0000 mL | Freq: Once | ORAL | Status: AC
  Administered 2017-02-19: 30 mL via ORAL
  Filled 2017-02-19: qty 30

## 2017-02-19 NOTE — ED Notes (Signed)
Patient taken to XRAY

## 2017-02-19 NOTE — ED Provider Notes (Signed)
[facility] EMERGENCY DEPARTMENT Provider Note   CSN: [id number] Arrival date & time: 02/19/17  1650     History   Chief Complaint Chief Complaint  Patient presents with  . Chest Pain    HPI Logan Wolfe is a 72 y.o. male.  The history is provided by the patient and medical records.  Chest Pain   This is a new problem. The current episode started more than 1 week ago. Episode frequency: intermittently. The problem has not changed since onset.The pain is associated with exertion. The pain is present in the substernal region. The pain is moderate. The quality of the pain is described as pressure-like and burning. The pain does not radiate. The symptoms are aggravated by exertion. Associated symptoms include shortness of breath. Pertinent negatives include no abdominal pain, no back pain, no cough, no diaphoresis, no dizziness, no fever, no headaches, no lower extremity edema, no malaise/fatigue, no nausea, no palpitations, no syncope and no vomiting. He has tried nitroglycerin for the symptoms. The treatment provided significant relief. Risk factors include being elderly and male gender.  Pertinent negatives for past medical history include no CAD, no diabetes, no hyperlipidemia, no hypertension, no PE and no seizures.  Pertinent negatives for family medical history include: no CAD.  Procedure history is positive for echocardiogram.    Past Medical History:  Diagnosis Date  . Allergic rhinitis, cause unspecified   . Anxiety   . Aortic valve calcification   . Cancer [facility])    Prostate CA, Dr Jeffie Pollock 22 years ago  . Constipation   . Depressive disorder   . Dyspepsia and other specified disorders of function of stomach   . GERD (gastroesophageal reflux disease)   . Hypoglycemia   . Memory loss   . Otitis    externa of right ear  . Spinal stenosis   . Unspecified pruritic disorder     Patient Active Problem List   Diagnosis Date Noted  . Acute, but  ill-defined, cerebrovascular disease 12/27/2012  . 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 Aortic valve disorder 12/05/2012  . Cancer ([place])   . GERD (gastroesophageal reflux disease)   . Hypoglycemia   . Depressive disorder   . Otitis     Past Surgical History:  Procedure Laterality Date  . PROSTATE SURGERY         Home Medications    Prior to Admission medications   Medication Sig Start Date End Date Taking? Authorizing Provider  aspirin 325 MG tablet Take 325 mg by mouth daily.   Yes [provider]  fluticasone (FLONASE) 50 MCG/ACT nasal spray Place 1 spray into the nose daily as needed.    Yes [provider]  ibuprofen (ADVIL,MOTRIN) 200 MG tablet Take 200 mg by mouth every 6 (six) hours as needed for pain.   Yes [provider]  MELATONIN PO Take 1 tablet by mouth at bedtime.   Yes [provider]    Family History Family History  Problem Relation Age of Onset  . Hyperlipidemia Mother   . Diabetes Mother   . Cancer Father        facial    Social History Social History   Tobacco Use  . Smoking status: Light Tobacco Smoker    Years: 15.00    Types: Cigars  . Smokeless tobacco: Never Used  Substance Use Topics  . Alcohol use: Yes  . Drug use: No  Allergies   Penicillins   Review of Systems Review of Systems  Constitutional: Negative for chills, diaphoresis, fever and malaise/fatigue.  HENT: Negative for rhinorrhea and sore throat.   Eyes: Negative for visual disturbance.  Respiratory: Positive for shortness of breath. Negative for cough.   Cardiovascular: Positive for chest pain. Negative for palpitations and syncope.  Gastrointestinal: Negative for abdominal pain, nausea and vomiting.  Genitourinary: Negative for dysuria and hematuria.  Musculoskeletal: Negative for arthralgias and back pain.  Skin: Negative for rash.  Allergic/Immunologic: Negative for  immunocompromised state.  Neurological: Negative for dizziness, seizures, syncope and headaches.  Psychiatric/Behavioral: Negative for confusion.  All other systems reviewed and are negative.    Physical Exam Updated Vital Signs BP 139/88   Pulse 71   Temp 98.2 F (36.8 C) (Oral)   Resp 20   SpO2 97%   Physical Exam  Constitutional: He is oriented to person, place, and time. He appears well-developed and well-nourished.  HENT:  Head: Normocephalic and atraumatic.  Eyes: Conjunctivae are normal.  Neck: Neck supple.  Cardiovascular: Normal rate and regular rhythm.  Murmur (2/6 holosystolic) heard. Pulmonary/Chest: Effort normal and breath sounds normal. No respiratory distress.  Abdominal: Soft. There is no tenderness.  Musculoskeletal: Normal range of motion. He exhibits no edema.  Neurological: He is alert and oriented to person, place, and time.  Skin: Skin is warm and dry.  Psychiatric: He has a normal mood and affect.  Nursing note and vitals reviewed.    ED Treatments / Results  Labs (all labs ordered are listed, but only abnormal results are displayed) Labs Reviewed  BASIC METABOLIC PANEL - Abnormal; Notable for the following components:      Result Value   Glucose, Bld 104 (*)    All other components within normal limits  CBC  I-STAT TROPONIN, ED  I-STAT TROPONIN, ED    EKG  EKG Interpretation  Date/Time:  Monday February 19 2017 16:57:08 EST Ventricular Rate:  62 PR Interval:    QRS Duration: 93 QT Interval:  411 QTC Calculation: 418 R Axis:   41 Text Interpretation:  Sinus rhythm ST-t wave abnormality Artifact Abnormal ekg Confirmed by Carmin Muskrat [phone]) on 02/19/2017 5:16:06 PM       Radiology Dg Chest 2 View  Result Date: 02/19/2017 CLINICAL DATA:  72 year old male with chest pain today. EKG changes. EXAM: CHEST  2 VIEW COMPARISON:  Chest radiographs and chest CTA 06/06/2009 FINDINGS: Lung volumes and mediastinal contours are within normal  limits. Visualized tracheal air column is within normal limits. No pneumothorax, pulmonary edema, pleural effusion or confluent pulmonary opacity. No acute osseous abnormality identified. Negative visible bowel gas pattern. IMPRESSION: No acute cardiopulmonary abnormality. Electronically Signed   By: Genevie Ann M.D.   On: 02/19/2017 17:27    Procedures Procedures (including critical care time)  Medications Ordered in ED Medications  nitroGLYCERIN (NITROSTAT) SL tablet 0.4 mg (0.4 mg Sublingual Given 02/19/17 1817)  alum & mag hydroxide-simeth (MAALOX/MYLANTA) 200-200-20 MG/5ML suspension 30 mL (30 mLs Oral Given 02/19/17 2013)  ranitidine (ZANTAC) 150 MG/10ML syrup 150 mg (150 mg Oral Given 02/19/17 2013)     Initial Impression / Assessment and Plan / ED Course  I have reviewed the triage vital signs and the nursing notes.  Pertinent labs & imaging results that were available during my care of the patient were reviewed by me and considered in my medical decision making (see chart for details).     Pt with h/o GERD, AS presents  with CP. Pt says he's had some mild substernal chest pressure for the last several weeks that he initially thought was indigestion; says the pain has been mild-moderate, w/o radiation, associated w/SOB, and worsened by exertion (going uphill on his walks & making his bed today). Went to his PCP's office for evaluation today where EKG showed inverted t-wave in V2; given 324mg  ASA, 1xNTG, and transported here by ambulance for further evaluation. Denies F/C, HA, lightheadedness, diaphoresis, cough, N/V, recent illness. Pt is currently asymptomatic.  VS & exam as above. EKG: NSR @ 62bpm w/normal intervals and no signs of ischemia. HEAR score 3. Low risk for PE per [facility]'. CXR WNL. Labs unremarkable, including negative initial troponin (0.01).  1750: Pt complained of acute onset CP while lying in bed. Repeat EKG unchanged (NSR @ 60bpm w/o abnormalities). NTG ordered.  Repeat  troponin undetectable. Cause of the Pt's symptoms sounds cardiac in nature, but no indication for admission or emergent testing. Recommend the Pt call his cardiologist to schedule additional provocative testing.  Explained all results to the Pt. Will discharge the Pt home. Recommending follow-up with Cardiology. ED return precautions provided. Pt acknowledged understanding of, and concurrence with the plan. All questions answered to his satisfaction. In stable condition at the time of discharge.  Final Clinical Impressions(s) / ED Diagnoses   Final diagnoses:  Precordial pain    ED Discharge Orders    None       Jenny Reichmann, MD 02/19/17 2119    Carmin Muskrat, MD 02/21/17 0025

## 2017-02-19 NOTE — ED Notes (Signed)
MD Lockwood at bedside.  

## 2017-02-19 NOTE — ED Notes (Signed)
Patient returned from XRAY 

## 2017-02-19 NOTE — ED Notes (Addendum)
Pt in hallway seen by this RN clutching chest and deep breathing. Pt endorses central chest pain states it is the worst it has been rated 7/10 lasted approximately 5 minutes. Pt denies associated symptoms or radiation but characterizes pain as tightness clenching fist over sternum. EKG unchanged- SB. VSS. Pt blood pressure checked prior to administration of nitro and patient sts now pain has completely resolved. No nitro given at this time.

## 2017-02-19 NOTE — ED Triage Notes (Signed)
From doctors office for chest pain that resolves with nitro. Had 1 nitro and 324 asa from doctors office.  Had r wave inversion on office ekg, normal for EMS.  A&Ox4 pain worse with movement.

## 2017-02-21 ENCOUNTER — Ambulatory Visit: Payer: PPO | Admitting: Physician Assistant

## 2017-03-05 NOTE — Progress Notes (Signed)
Cardiology Office Note   Date:  03/06/2017   ID:  Chipper, Koudelka 1946/02/01, MRN [id number]  PCP:  Lawerance Cruel, MD    No chief complaint on file.  CP , SHOB  Wt Readings from Last 3 Encounters:  03/06/17 237 lb 12.8 oz (107.9 kg)  02/23/14 240 lb (108.9 kg)  12/27/12 241 lb (109.3 kg)       History of Present Illness: Logan Wolfe is a 72 y.o. male  who had transient confusion for 15 minutes in 2014.  He could not read certain words.  He had trouble spelling certain words as well.  He saw Dr. Harrington Challenger who sent him for MRI of his brain. This did not show evidence of recent embolic phenomenon at the time.  He has had a prior cerebellar infarct.   2014 echo showed: Left ventricle: The cavity size was normal. There was mild focal basal hypertrophy of the septum. Systolic function was normal. The estimated ejection fraction was in the range of 60% to 65%. Wall motion was normal; there were no regional wall motion abnormalities. - Aortic valve: There was mild stenosis by velocities, although leafelt excursion appearedmoderately restricted. Trivial regurgitation. - Mitral valve: Calcified annulus. Mildly calcified leaflets . Mild regurgitation.  In the past, he did have an occasional burning in his chest that felt like indigestion.  It was not related to exertion.  Episodes are shortlived and terminate spontaneously.    He was lifting boxes and moving inventroy without problems with that other than joint and back pains.  His back and hips limited his walking.  He runs a [employer], [employer].  He is semiretired.  He has had some SHOB more recently, with exertion.  He had some chest discomfort as well and he thought it was from the hiatal hernia.  He went to the ER after he had an abnormal ECG at the office with Dr. Harrington Challenger which showed septal Q waves.  He had a negative w/u.   His chest pain has improved since starting medicine for acid reflux.  He walks up steps  without a problem.  He had some SHOB when walking outside in the cold and doing some yardwork.   He has had a recurrence of prostate cancer based on PSA, but PET scan was negative.  Denies :  Dizziness. Leg edema. Nitroglycerin use. Orthopnea. Palpitations. Syncope.       Past Medical History:  Diagnosis Date  . Allergic rhinitis, cause unspecified   . Anxiety   . Aortic valve calcification   . Cancer [facility])    Prostate CA, Dr Jeffie Pollock 22 years ago  . Constipation   . Depressive disorder   . Dyspepsia and other specified disorders of function of stomach   . GERD (gastroesophageal reflux disease)   . Hypoglycemia   . Memory loss   . Otitis    externa of right ear  . Spinal stenosis   . Unspecified pruritic disorder     Past Surgical History:  Procedure Laterality Date  . PROSTATE SURGERY       Current Outpatient Medications  Medication Sig Dispense Refill  . aspirin 325 MG tablet Take 325 mg by mouth daily.    . fluticasone (FLONASE) 50 MCG/ACT nasal spray Place 1 spray into the nose daily as needed.     Marland Kitchen ibuprofen (ADVIL,MOTRIN) 200 MG tablet Take 200 mg by mouth every 6 (six) hours as needed for pain.    Marland Kitchen MELATONIN PO  Take 1 tablet by mouth at bedtime.    . pantoprazole (PROTONIX) 40 MG tablet Take 40 mg by mouth daily.  0   No current facility-administered medications for this visit.     Allergies:   Penicillins    Social History:  The patient  reports that he has been smoking cigars.  He has smoked for the past 15.00 years. he has never used smokeless tobacco. He reports that he drinks alcohol. He reports that he does not use drugs.   Family History:  The patient's family history includes Cancer in his father; Diabetes in his mother; Hyperlipidemia in his mother.    ROS:  Please see the history of present illness.   Otherwise, review of systems are positive for DOE, CP.   All other systems are reviewed and negative.    PHYSICAL EXAM: VS:  BP 140/86   Pulse  80   Ht 6\' 2"  (1.88 m)   Wt 237 lb 12.8 oz (107.9 kg)   SpO2 98%   BMI 30.53 kg/m  , BMI Body mass index is 30.53 kg/m. GEN: Well nourished, well developed, in no acute distress  HEENT: normal  Neck: no JVD, carotid bruits, or masses Cardiac: RRR; 3/6 systolic murmurs, no rubs, or gallops,no edema  Respiratory:  clear to auscultation bilaterally, normal work of breathing GI: soft, nontender, nondistended, + BS MS: no deformity or atrophy  Skin: warm and dry, no rash Neuro:  Strength and sensation are intact Psych: euthymic mood, full affect   EKG:   The ekg ordered today demonstrates NSR , septal Q waves resolved, no ST changes   Recent Labs: 02/19/2017: BUN 15; Creatinine, Ser 0.93; Hemoglobin 13.5; Platelets 180; Potassium 4.2; Sodium 137   Lipid Panel    Component Value Date/Time   CHOL 210 (H) 12/27/2012 1109   TRIG 142 12/27/2012 1109   HDL 56 12/27/2012 1109   CHOLHDL 3.8 12/27/2012 1109   LDLCALC 126 (H) 12/27/2012 1109     Other studies Reviewed: Additional studies/ records that were reviewed today with results demonstrating: ECG from Dr. Harrington Challenger, NSR with septal Q waves.   ASSESSMENT AND PLAN:  1. Aortic stenosis: Mild in the past.  Now with [facility] and CP.  Plan for echo to eval AS.  Consider stress test based on the echo.  No syncope.  [place] change resolved. 2. Transient confusion in 2014:  He feels that his memory is getting worse.   Rare episodes of shortlived confusion. 3. Carotid artery disease: Reviewed carotid Doppler in 2014.  There was minimal plaque bilaterally.  He has had a prior cerebellar infarct.   4. GERD: CP improved with reflux meds.   Current medicines are reviewed at length with the patient today.  The patient concerns regarding his medicines were addressed.  The following changes have been made:  No change  Labs/ tests ordered today include:   Orders Placed This Encounter  Procedures  . ECHOCARDIOGRAM COMPLETE    Recommend 150  minutes/week of aerobic exercise Low fat, low carb, high fiber diet recommended  Disposition:   FU in for echo   Signed, Larae Grooms, MD  03/06/2017 10:22 AM    [facility] [address], [place], [place]  [address] Phone: [phone]; Fax: [phone]

## 2017-03-06 ENCOUNTER — Ambulatory Visit: Payer: PPO | Admitting: Interventional Cardiology

## 2017-03-06 ENCOUNTER — Encounter: Payer: Self-pay | Admitting: Interventional Cardiology

## 2017-03-06 VITALS — BP 140/86 | HR 80 | Ht 74.0 in | Wt 237.8 lb

## 2017-03-06 DIAGNOSIS — R0609 Other forms of dyspnea: Secondary | ICD-10-CM

## 2017-03-06 DIAGNOSIS — I6523 Occlusion and stenosis of bilateral carotid arteries: Secondary | ICD-10-CM

## 2017-03-06 DIAGNOSIS — I350 Nonrheumatic aortic (valve) stenosis: Secondary | ICD-10-CM

## 2017-03-06 DIAGNOSIS — K219 Gastro-esophageal reflux disease without esophagitis: Secondary | ICD-10-CM

## 2017-03-06 DIAGNOSIS — R9431 Abnormal electrocardiogram [ECG] [EKG]: Secondary | ICD-10-CM

## 2017-03-06 DIAGNOSIS — R072 Precordial pain: Secondary | ICD-10-CM

## 2017-03-06 NOTE — Patient Instructions (Signed)
Medication Instructions:  Your physician recommends that you continue on your current medications as directed. Please refer to the Current Medication list given to you today.   Labwork: None ordered  Testing/Procedures: Your physician has requested that you have an echocardiogram. Echocardiography is a painless test that uses sound waves to create images of your heart. It provides your doctor with information about the size and shape of your heart and how well your heart's chambers and valves are working. This procedure takes approximately one hour. There are no restrictions for this procedure.  Follow-Up: Based on echocardiogram results   Any Other Special Instructions Will Be Listed Below (If Applicable).  Echocardiogram An echocardiogram, or echocardiography, uses sound waves (ultrasound) to produce an image of your heart. The echocardiogram is simple, painless, obtained within a short period of time, and offers valuable information to your health care provider. The images from an echocardiogram can provide information such as:  Evidence of coronary artery disease (CAD).  Heart size.  Heart muscle function.  Heart valve function.  Aneurysm detection.  Evidence of a past heart attack.  Fluid buildup around the heart.  Heart muscle thickening.  Assess heart valve function.  Tell a health care provider about:  Any allergies you have.  All medicines you are taking, including vitamins, herbs, eye drops, creams, and over-the-counter medicines.  Any problems you or family members have had with anesthetic medicines.  Any blood disorders you have.  Any surgeries you have had.  Any medical conditions you have.  Whether you are pregnant or may be pregnant. What happens before the procedure? No special preparation is needed. Eat and drink normally. What happens during the procedure?  In order to produce an image of your heart, gel will be applied to your chest and a  wand-like tool (transducer) will be moved over your chest. The gel will help transmit the sound waves from the transducer. The sound waves will harmlessly bounce off your heart to allow the heart images to be captured in real-time motion. These images will then be recorded.  You may need an IV to receive a medicine that improves the quality of the pictures. What happens after the procedure? You may return to your normal schedule including diet, activities, and medicines, unless your health care provider tells you otherwise. This information is not intended to replace advice given to you by your health care provider. Make sure you discuss any questions you have with your health care provider. Document Released: 01/28/2000 Document Revised: 09/18/2015 Document Reviewed: 10/07/2012 Elsevier Interactive Patient Education  2017 [employer].    If you need a refill on your cardiac medications before your next appointment, please call your pharmacy.

## 2017-03-13 DIAGNOSIS — H01001 Unspecified blepharitis right upper eyelid: Secondary | ICD-10-CM | POA: Diagnosis not present

## 2017-03-14 ENCOUNTER — Ambulatory Visit (HOSPITAL_COMMUNITY): Payer: PPO | Attending: Internal Medicine

## 2017-03-14 ENCOUNTER — Other Ambulatory Visit: Payer: Self-pay

## 2017-03-14 ENCOUNTER — Encounter (INDEPENDENT_AMBULATORY_CARE_PROVIDER_SITE_OTHER): Payer: Self-pay

## 2017-03-14 DIAGNOSIS — I350 Nonrheumatic aortic (valve) stenosis: Secondary | ICD-10-CM | POA: Diagnosis not present

## 2017-03-14 DIAGNOSIS — F17290 Nicotine dependence, other tobacco product, uncomplicated: Secondary | ICD-10-CM | POA: Insufficient documentation

## 2017-03-14 DIAGNOSIS — Z8673 Personal history of transient ischemic attack (TIA), and cerebral infarction without residual deficits: Secondary | ICD-10-CM | POA: Insufficient documentation

## 2017-03-15 ENCOUNTER — Telehealth: Payer: Self-pay | Admitting: Interventional Cardiology

## 2017-03-15 NOTE — Telephone Encounter (Signed)
Patient made aware that his echo showed normal LVEF and mild AS. Patient states that since starting his reflux meds his chest pain is virtually gone. Patient denies any other symptoms. Instructed for the patient to let us know if his symptoms return. Patient verbalized understanding and thanked me for the call. Plan for f/u in 1 year.

## 2017-03-15 NOTE — Telephone Encounter (Signed)
-----   Message from Jettie Booze, MD sent at 03/15/2017  8:31 AM EST ----- Normal LVEF. Aortic stenosis is still only mild.  Let us know if any symptoms persist.  Based on my note, chest pain was better with Rx for reflux.

## 2017-03-15 NOTE — Telephone Encounter (Signed)
F/U call ° °Patient returning call for echo results  °

## 2017-04-10 DIAGNOSIS — H52223 Regular astigmatism, bilateral: Secondary | ICD-10-CM | POA: Diagnosis not present

## 2017-04-10 DIAGNOSIS — H524 Presbyopia: Secondary | ICD-10-CM | POA: Diagnosis not present

## 2017-04-10 DIAGNOSIS — H5203 Hypermetropia, bilateral: Secondary | ICD-10-CM | POA: Diagnosis not present

## 2017-05-11 ENCOUNTER — Telehealth: Payer: Self-pay | Admitting: Interventional Cardiology

## 2017-05-11 DIAGNOSIS — R0602 Shortness of breath: Secondary | ICD-10-CM

## 2017-05-11 NOTE — Telephone Encounter (Signed)
Pt c/o Shortness Of Breath: STAT if SOB developed within the last 24 hours or pt is noticeably SOB on the phone  1. Are you currently SOB (can you hear that pt is SOB on the phone)? no 2. How long have you been experiencing SOB? 3 or 4 days  3. Are you SOB when sitting or when up moving around? Up moving around    4. Are you currently experiencing any other symptoms? no

## 2017-05-11 NOTE — Telephone Encounter (Signed)
Per pt was seen a while back and complained of sob at that time, but pt felt symptom was related to hiatal hernia the patient was told if returned to call and let us know the patient has sob with very little activity ,no swelling noted is able to lay flat at  bedtime has had an increase appetite and also has restarted reflux med last week.Will forward to Dr Irish Lack for review and recommendations ./cy

## 2017-05-12 NOTE — Telephone Encounter (Signed)
OK to order nuclear stress test.  Exercise if possible but Lexiscan ok if he cannot exercise.

## 2017-05-14 NOTE — Telephone Encounter (Signed)
Left message for patient to call back  

## 2017-05-14 NOTE — Telephone Encounter (Signed)
Called and made the patient aware that Dr. Irish Lack would like to order a stress test. Patient states that he does not feel like he could walk on a treadmill. Made patient aware that I will order Lexiscan and that he will be receiving a call from the scheduler for an appointment. Patient verbalized understanding and thanked me for the call.

## 2017-05-15 ENCOUNTER — Telehealth (HOSPITAL_COMMUNITY): Payer: Self-pay | Admitting: *Deleted

## 2017-05-15 NOTE — Telephone Encounter (Signed)
Patient given detailed instructions per Myocardial Perfusion Study Information Sheet for the test on 05/18/17 at 1100. Patient notified to arrive 15 minutes early and that it is imperative to arrive on time for appointment to keep from having the test rescheduled.  If you need to cancel or reschedule your appointment, please call the office within 24 hours of your appointment. . Patient verbalized understanding.[name], Logan Wolfe

## 2017-05-18 ENCOUNTER — Ambulatory Visit (HOSPITAL_COMMUNITY): Payer: PPO | Attending: Internal Medicine

## 2017-05-18 DIAGNOSIS — R9439 Abnormal result of other cardiovascular function study: Secondary | ICD-10-CM | POA: Insufficient documentation

## 2017-05-18 DIAGNOSIS — R0602 Shortness of breath: Secondary | ICD-10-CM | POA: Diagnosis not present

## 2017-05-18 MED ORDER — TECHNETIUM TC 99M TETROFOSMIN IV KIT
31.7000 | PACK | Freq: Once | INTRAVENOUS | Status: AC | PRN
  Administered 2017-05-18: 31.7 via INTRAVENOUS
  Filled 2017-05-18: qty 32

## 2017-05-18 MED ORDER — TECHNETIUM TC 99M TETROFOSMIN IV KIT
10.4000 | PACK | Freq: Once | INTRAVENOUS | Status: AC | PRN
  Administered 2017-05-18: 10.4 via INTRAVENOUS
  Filled 2017-05-18: qty 11

## 2017-05-18 MED ORDER — REGADENOSON 0.4 MG/5ML IV SOLN
0.4000 mg | Freq: Once | INTRAVENOUS | Status: AC
  Administered 2017-05-18: 0.4 mg via INTRAVENOUS

## 2017-05-21 DIAGNOSIS — R079 Chest pain, unspecified: Secondary | ICD-10-CM | POA: Diagnosis not present

## 2017-05-21 DIAGNOSIS — R0609 Other forms of dyspnea: Secondary | ICD-10-CM | POA: Diagnosis not present

## 2017-05-21 LAB — MYOCARDIAL PERFUSION IMAGING
CHL CUP NUCLEAR SDS: 4
CHL CUP NUCLEAR SRS: 12
CHL CUP RESTING HR STRESS: 60 {beats}/min
LV dias vol: 103 mL (ref 62–150)
LVSYSVOL: 37 mL
NUC STRESS TID: 1.01
Peak HR: 95 {beats}/min
RATE: 0.3
SSS: 14

## 2017-06-12 DIAGNOSIS — C61 Malignant neoplasm of prostate: Secondary | ICD-10-CM | POA: Diagnosis not present

## 2017-06-20 DIAGNOSIS — C61 Malignant neoplasm of prostate: Secondary | ICD-10-CM | POA: Diagnosis not present

## 2017-06-20 DIAGNOSIS — N393 Stress incontinence (female) (male): Secondary | ICD-10-CM | POA: Diagnosis not present

## 2017-06-20 DIAGNOSIS — N200 Calculus of kidney: Secondary | ICD-10-CM | POA: Diagnosis not present

## 2017-06-20 DIAGNOSIS — R9721 Rising PSA following treatment for malignant neoplasm of prostate: Secondary | ICD-10-CM | POA: Diagnosis not present

## 2017-08-15 DIAGNOSIS — H6690 Otitis media, unspecified, unspecified ear: Secondary | ICD-10-CM | POA: Diagnosis not present

## 2017-10-23 DIAGNOSIS — L609 Nail disorder, unspecified: Secondary | ICD-10-CM | POA: Diagnosis not present

## 2017-10-23 DIAGNOSIS — Z23 Encounter for immunization: Secondary | ICD-10-CM | POA: Diagnosis not present

## 2017-11-23 DIAGNOSIS — Z131 Encounter for screening for diabetes mellitus: Secondary | ICD-10-CM | POA: Diagnosis not present

## 2017-11-23 DIAGNOSIS — E7800 Pure hypercholesterolemia, unspecified: Secondary | ICD-10-CM | POA: Diagnosis not present

## 2017-11-26 DIAGNOSIS — M4800 Spinal stenosis, site unspecified: Secondary | ICD-10-CM | POA: Diagnosis not present

## 2017-11-26 DIAGNOSIS — Z0000 Encounter for general adult medical examination without abnormal findings: Secondary | ICD-10-CM | POA: Diagnosis not present

## 2017-11-26 DIAGNOSIS — Z1389 Encounter for screening for other disorder: Secondary | ICD-10-CM | POA: Diagnosis not present

## 2017-11-26 DIAGNOSIS — C61 Malignant neoplasm of prostate: Secondary | ICD-10-CM | POA: Diagnosis not present

## 2017-11-26 DIAGNOSIS — E7800 Pure hypercholesterolemia, unspecified: Secondary | ICD-10-CM | POA: Diagnosis not present

## 2017-12-06 DIAGNOSIS — J069 Acute upper respiratory infection, unspecified: Secondary | ICD-10-CM | POA: Diagnosis not present

## 2017-12-17 ENCOUNTER — Telehealth: Payer: Self-pay | Admitting: Interventional Cardiology

## 2017-12-17 NOTE — Telephone Encounter (Signed)
Returned call to patient. He states that he has had intermittent episodes of pain in his back between his shoulder blades. He describes this pain as very intense and that it felt like someone was pushing a hard rod into his back. He states that the episodes happen at rest and last for about 5 minutes at a time. Patient denies any chest pain, SOB, lightheadedness, dizziness, or any other Sx. Patient denies having any Sx at this time. Patient's BP has been 125/80 HR 80s. Appointment made for patient to see Lyda Jester, PA 12/18/17 at 11:30 AM.

## 2017-12-17 NOTE — Telephone Encounter (Signed)
  Patient said he is experiencing an extreme pain in his back behind his shoulder blade that comes and goes. It has happened twice, four days ago and last night. Pain is very abrupt and unbearable and then goes away. Patient is concerned and wants to know if he should be seen to have it checked.

## 2017-12-18 ENCOUNTER — Ambulatory Visit: Payer: PPO | Admitting: Cardiology

## 2017-12-18 DIAGNOSIS — M62838 Other muscle spasm: Secondary | ICD-10-CM | POA: Diagnosis not present

## 2018-01-23 DIAGNOSIS — C61 Malignant neoplasm of prostate: Secondary | ICD-10-CM | POA: Diagnosis not present

## 2018-01-30 DIAGNOSIS — R3915 Urgency of urination: Secondary | ICD-10-CM | POA: Diagnosis not present

## 2018-01-30 DIAGNOSIS — C61 Malignant neoplasm of prostate: Secondary | ICD-10-CM | POA: Diagnosis not present

## 2018-01-30 DIAGNOSIS — N393 Stress incontinence (female) (male): Secondary | ICD-10-CM | POA: Diagnosis not present

## 2018-02-08 ENCOUNTER — Telehealth: Payer: Self-pay | Admitting: Genetic Counselor

## 2018-02-08 ENCOUNTER — Encounter: Payer: Self-pay | Admitting: Genetic Counselor

## 2018-02-08 NOTE — Telephone Encounter (Signed)
A genetic counseling appt has been scheduled for the Logan Wolfe to see Roma Kayser on 1/27 at 2pm. Letter mailed to the Logan Wolfe.

## 2018-03-05 DIAGNOSIS — C61 Malignant neoplasm of prostate: Secondary | ICD-10-CM | POA: Diagnosis not present

## 2018-03-05 DIAGNOSIS — N3942 Incontinence without sensory awareness: Secondary | ICD-10-CM | POA: Diagnosis not present

## 2018-03-05 DIAGNOSIS — R350 Frequency of micturition: Secondary | ICD-10-CM | POA: Diagnosis not present

## 2018-03-05 DIAGNOSIS — N3946 Mixed incontinence: Secondary | ICD-10-CM | POA: Diagnosis not present

## 2018-03-11 ENCOUNTER — Other Ambulatory Visit: Payer: PPO

## 2018-03-11 ENCOUNTER — Encounter: Payer: PPO | Admitting: Genetic Counselor

## 2018-03-14 DIAGNOSIS — H6981 Other specified disorders of Eustachian tube, right ear: Secondary | ICD-10-CM | POA: Diagnosis not present

## 2018-03-22 DIAGNOSIS — N3946 Mixed incontinence: Secondary | ICD-10-CM | POA: Diagnosis not present

## 2018-03-22 DIAGNOSIS — N3942 Incontinence without sensory awareness: Secondary | ICD-10-CM | POA: Diagnosis not present

## 2018-03-27 DIAGNOSIS — H524 Presbyopia: Secondary | ICD-10-CM | POA: Diagnosis not present

## 2018-03-27 DIAGNOSIS — H52223 Regular astigmatism, bilateral: Secondary | ICD-10-CM | POA: Diagnosis not present

## 2018-03-27 DIAGNOSIS — H5203 Hypermetropia, bilateral: Secondary | ICD-10-CM | POA: Diagnosis not present

## 2018-03-27 IMAGING — CT NM PET NOPR SKULL BASE TO THIGH
7 series · 25 of 25 positions shown · non-contrast
Comparison: Bone scan 12/02/2013

CLINICAL DATA: Prostate carcinoma with biochemical recurrence.
Prostatectomy 26 years prior. Elevated PSA equal 11 point bile.

EXAM:
NUCLEAR MEDICINE PET SKULL BASE TO THIGH
TECHNIQUE: 9.8 mCi F-18 Fluciclovine was injected intravenously. Full-ring PET
imaging was performed from the skull base to thigh after the
radiotracer. CT data was obtained and used for attenuation
correction and anatomic localization.

[Series 3: pet sk_thigh ac · axial · 5.0mm · 4.07mm/px · z∈[+738,+1706]mm · 6 of 243 slices shown]
[im 1/243]
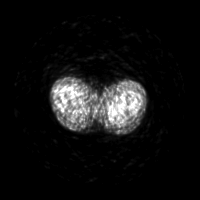
[im 49/243]
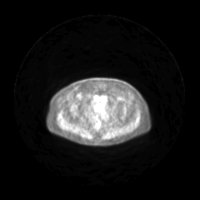
[im 97/243]
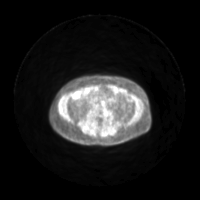
[im 146/243]
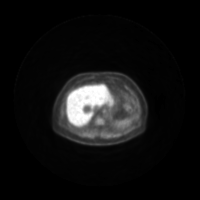
[im 194/243]
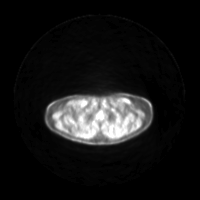
[im 243/243]
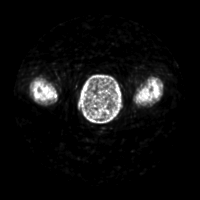

[Series 4: ct sk_thigh 5.0 hd_fov · axial · 5.0mm · 1.27mm/px · z∈[+738,+1706]mm · 6 of 243 slices shown]
[im 1/243]
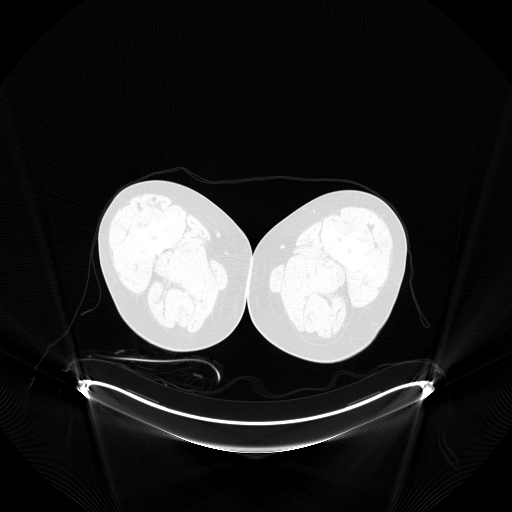
[im 49/243]
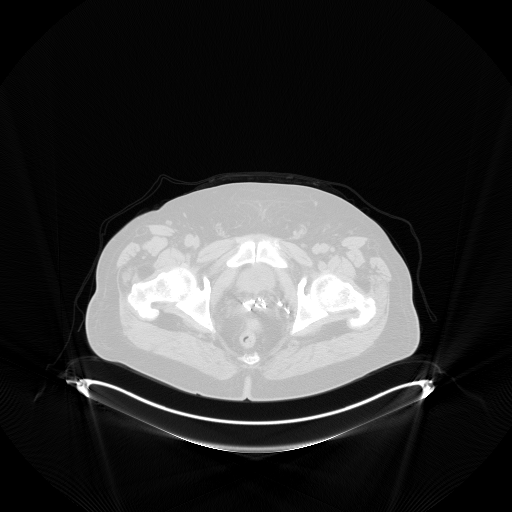
[im 97/243]
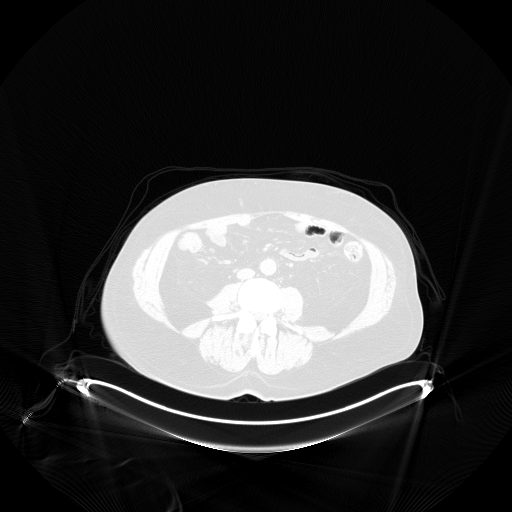
[im 146/243]
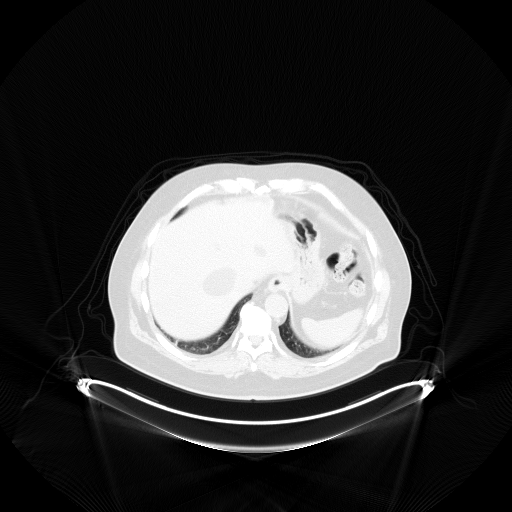
[im 194/243]
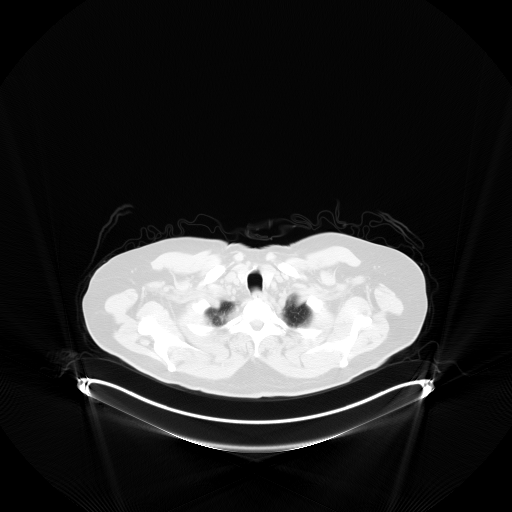
[im 243/243]
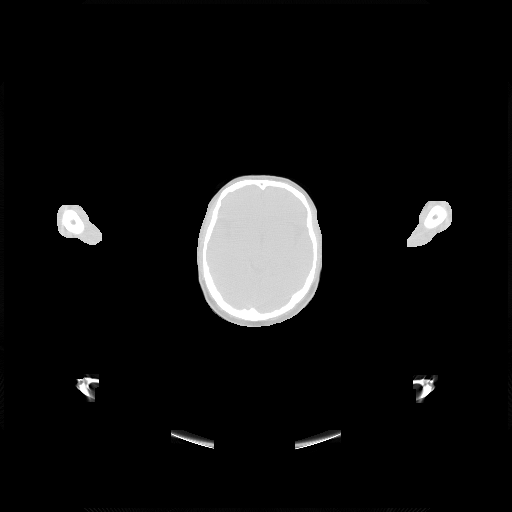

[Series 5: pet sk_thigh nac · axial · 5.0mm · 4.07mm/px · z∈[+738,+1706]mm · 5 of 243 slices shown]
[im 1/243]
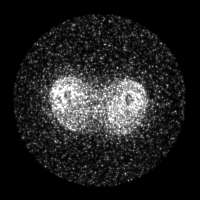
[im 61/243]
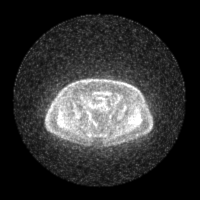
[im 122/243]
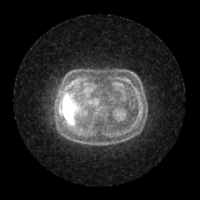
[im 182/243]
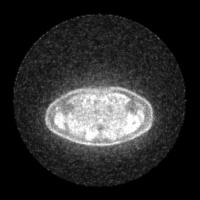
[im 243/243]
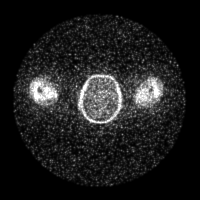

[Series 8: ct sk_thigh 5.0 (id) lung_bone · axial · 5.0mm · 0.74mm/px · 1 of 66 slices shown]
[im 1/66  bone]
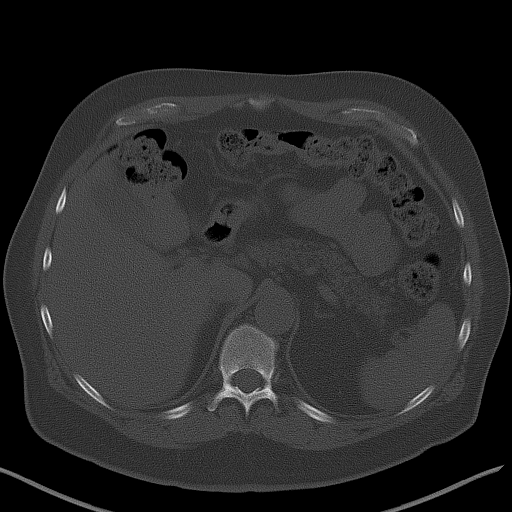

[Series 603: range-ct sk_thigh 5.0 hd_fov-cor-<alpha range> · 1 of 49 slices shown]
[im 1/49]
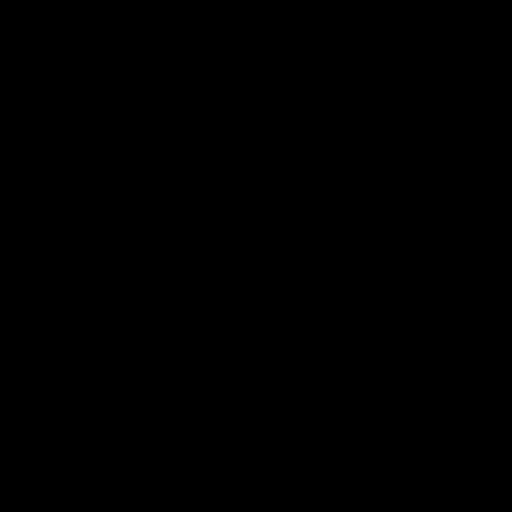

[Series 604: mip range · coronal · 2.01mm/px · 1 of 32 slices shown]
[im 1/32]
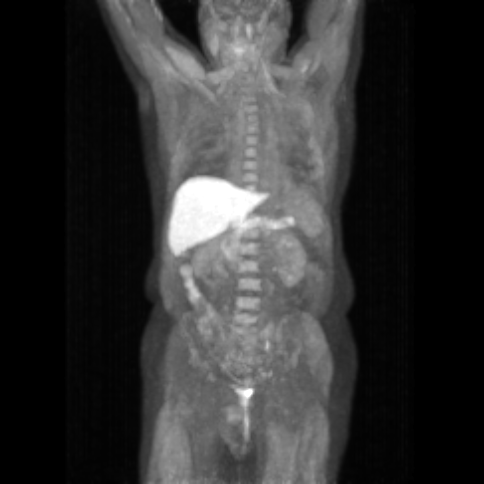

[Series 605: range-ct sk_thigh 5.0 hd_fov-tra-<alpha range> · 5 of 235 slices shown]
[im 1/235]
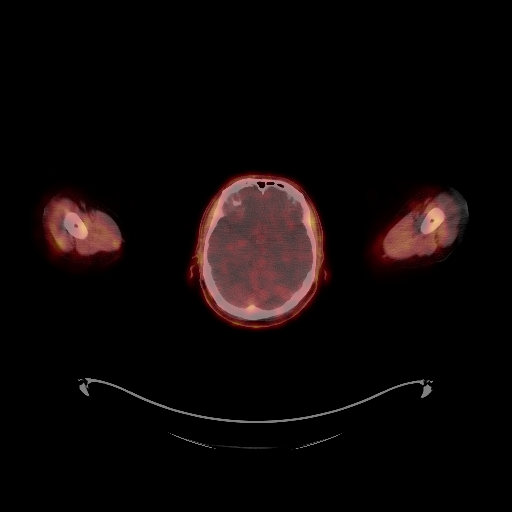
[im 59/235]
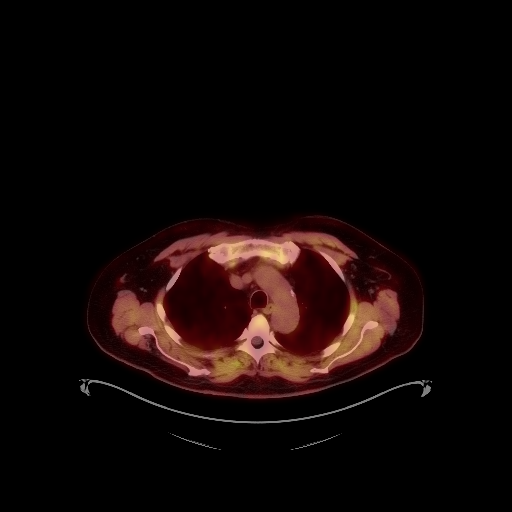
[im 118/235]
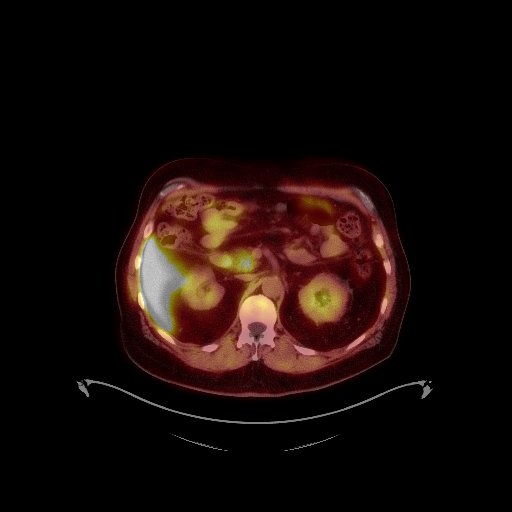
[im 176/235]
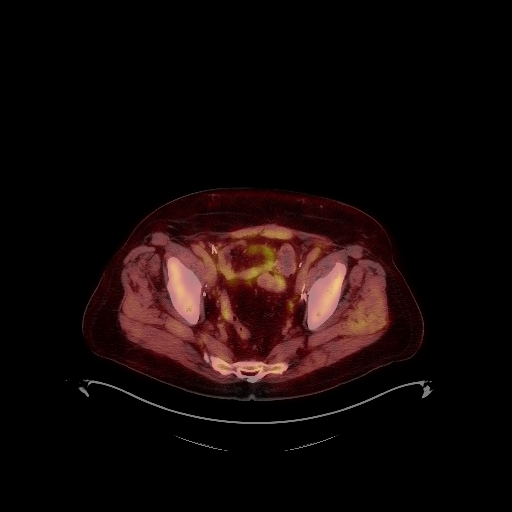
[im 235/235]
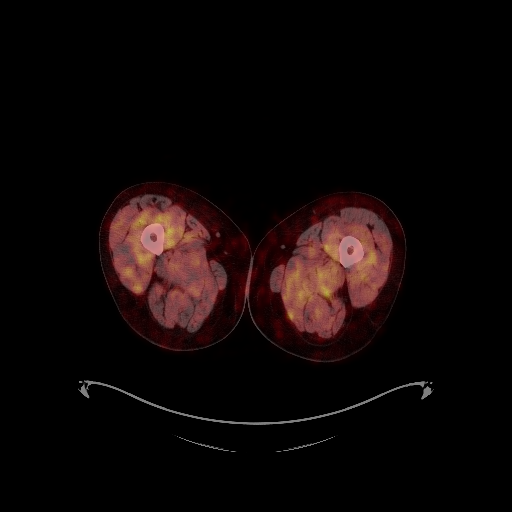

[25 of 25 positions shown; findings below may reference images not displayed]

FINDINGS: NECK

No radiotracer activity in neck lymph nodes.

CHEST

No radiotracer accumulation within mediastinal or hilar lymph nodes.
No suspicious pulmonary nodules on the CT scan.

ABDOMEN/PELVIS

No focal radiotracer activity within the prostate bed.

No radiotracer activity within pelvic lymph nodes. No radiotracer
activity in periaortic lymph nodes. No enlarged lymph nodes
identified.

Benign LEFT adrenal adenoma measures 16 mm, has negative Hounsfield
units and mild radiotracer accumulation. Smaller adenoma of the
RIGHT adrenal gland additionally

Physiologic activity noted within the liver and pancreas.

SKELETON

No focal  activity to suggest skeletal metastasis.
IMPRESSION: 1. No evidence of local prostate cancer recurrence in the
prostatectomy bed.
2. No evidence of metastatic adenopathy in the abdomen or pelvis.
3. No skeletal metastasis.

## 2018-04-05 DIAGNOSIS — N3942 Incontinence without sensory awareness: Secondary | ICD-10-CM | POA: Diagnosis not present

## 2018-04-05 DIAGNOSIS — N3946 Mixed incontinence: Secondary | ICD-10-CM | POA: Diagnosis not present

## 2018-07-30 DIAGNOSIS — L609 Nail disorder, unspecified: Secondary | ICD-10-CM | POA: Diagnosis not present

## 2018-08-01 DIAGNOSIS — C61 Malignant neoplasm of prostate: Secondary | ICD-10-CM | POA: Diagnosis not present

## 2018-08-07 DIAGNOSIS — N3946 Mixed incontinence: Secondary | ICD-10-CM | POA: Diagnosis not present

## 2018-08-07 DIAGNOSIS — C61 Malignant neoplasm of prostate: Secondary | ICD-10-CM | POA: Diagnosis not present

## 2018-08-07 DIAGNOSIS — Z87442 Personal history of urinary calculi: Secondary | ICD-10-CM | POA: Diagnosis not present

## 2018-08-08 DIAGNOSIS — J392 Other diseases of pharynx: Secondary | ICD-10-CM | POA: Diagnosis not present

## 2018-08-20 DIAGNOSIS — M79674 Pain in right toe(s): Secondary | ICD-10-CM | POA: Diagnosis not present

## 2018-08-28 DIAGNOSIS — R1310 Dysphagia, unspecified: Secondary | ICD-10-CM | POA: Diagnosis not present

## 2018-08-28 DIAGNOSIS — K52832 Lymphocytic colitis: Secondary | ICD-10-CM | POA: Diagnosis not present

## 2018-08-29 ENCOUNTER — Other Ambulatory Visit: Payer: Self-pay | Admitting: Gastroenterology

## 2018-08-29 DIAGNOSIS — R1310 Dysphagia, unspecified: Secondary | ICD-10-CM

## 2018-09-02 ENCOUNTER — Ambulatory Visit
Admission: RE | Admit: 2018-09-02 | Discharge: 2018-09-02 | Disposition: A | Discharge status: Home / Self Care | Payer: PPO | Source: Ambulatory Visit | Attending: Gastroenterology | Admitting: Gastroenterology

## 2018-09-02 DIAGNOSIS — R0989 Other specified symptoms and signs involving the circulatory and respiratory systems: Secondary | ICD-10-CM | POA: Diagnosis not present

## 2018-09-02 DIAGNOSIS — R1310 Dysphagia, unspecified: Secondary | ICD-10-CM

## 2018-09-16 DIAGNOSIS — R1310 Dysphagia, unspecified: Secondary | ICD-10-CM | POA: Diagnosis not present

## 2018-11-19 DIAGNOSIS — Z23 Encounter for immunization: Secondary | ICD-10-CM | POA: Diagnosis not present

## 2019-01-01 DIAGNOSIS — Z79899 Other long term (current) drug therapy: Secondary | ICD-10-CM | POA: Diagnosis not present

## 2019-01-01 DIAGNOSIS — E7800 Pure hypercholesterolemia, unspecified: Secondary | ICD-10-CM | POA: Diagnosis not present

## 2019-01-01 DIAGNOSIS — Z0000 Encounter for general adult medical examination without abnormal findings: Secondary | ICD-10-CM | POA: Diagnosis not present

## 2019-01-02 ENCOUNTER — Other Ambulatory Visit: Payer: Self-pay

## 2019-01-02 DIAGNOSIS — Z20822 Contact with and (suspected) exposure to covid-19: Secondary | ICD-10-CM

## 2019-01-04 LAB — NOVEL CORONAVIRUS, NAA: SARS-CoV-2, NAA: NOT DETECTED

## 2019-01-22 DIAGNOSIS — C61 Malignant neoplasm of prostate: Secondary | ICD-10-CM | POA: Diagnosis not present

## 2019-01-29 DIAGNOSIS — Z87442 Personal history of urinary calculi: Secondary | ICD-10-CM | POA: Diagnosis not present

## 2019-01-29 DIAGNOSIS — N3946 Mixed incontinence: Secondary | ICD-10-CM | POA: Diagnosis not present

## 2019-01-29 DIAGNOSIS — C61 Malignant neoplasm of prostate: Secondary | ICD-10-CM | POA: Diagnosis not present

## 2019-03-08 ENCOUNTER — Ambulatory Visit: Payer: PPO | Attending: Internal Medicine

## 2019-03-08 ENCOUNTER — Ambulatory Visit: Payer: PPO

## 2019-03-08 DIAGNOSIS — Z23 Encounter for immunization: Secondary | ICD-10-CM | POA: Insufficient documentation

## 2019-03-08 NOTE — Progress Notes (Signed)
.    Covid-19 Vaccination Clinic  Name:  ASIER SHAMBLEN    MRN: [id number] DOB: [birth year]  03/08/2019  Mr. Bradbury was observed post Covid-19 immunization for 15 minutes without incidence. He was provided with Vaccine Information Sheet and instruction to access the V-Safe system.   Mr. Corvin was instructed to call 911 with any severe reactions post vaccine: Marland Kitchen Difficulty breathing  . Swelling of your face and throat  . A fast heartbeat  . A bad rash all over your body  . Dizziness and weakness    Immunizations Administered    Name Date Dose VIS Date Route   Pfizer COVID-19 Vaccine 03/08/2019 10:41 AM 0.3 mL 01/24/2019 Intramuscular   Manufacturer: [place]   Lot: [id number]   [place]: [id number]

## 2019-03-25 ENCOUNTER — Ambulatory Visit: Payer: PPO | Attending: Internal Medicine

## 2019-03-25 DIAGNOSIS — Z23 Encounter for immunization: Secondary | ICD-10-CM | POA: Insufficient documentation

## 2019-03-25 NOTE — Progress Notes (Signed)
   Covid-19 Vaccination Clinic  Name:  Logan Wolfe    MRN: [id number] DOB: 1945-03-04  03/25/2019  [name] was observed post Covid-19 immunization for 15 minutes without incidence. He was provided with Vaccine Information Sheet and instruction to access the V-Safe system.   [name] was instructed to call 911 with any severe reactions post vaccine: Marland Kitchen Difficulty breathing  . Swelling of your face and throat  . A fast heartbeat  . A bad rash all over your body  . Dizziness and weakness    Immunizations Administered    Name Date Dose VIS Date Route   Pfizer COVID-19 Vaccine 03/25/2019  8:59 AM 0.3 mL 01/24/2019 Intramuscular   Manufacturer: [place]   Lot: [id number]   [place]: [id number]

## 2019-06-26 ENCOUNTER — Other Ambulatory Visit (HOSPITAL_COMMUNITY): Payer: Self-pay | Admitting: Urology

## 2019-06-26 DIAGNOSIS — R972 Elevated prostate specific antigen [PSA]: Secondary | ICD-10-CM

## 2019-06-26 DIAGNOSIS — C61 Malignant neoplasm of prostate: Secondary | ICD-10-CM

## 2019-07-04 DIAGNOSIS — M25511 Pain in right shoulder: Secondary | ICD-10-CM | POA: Diagnosis not present

## 2019-07-04 DIAGNOSIS — M542 Cervicalgia: Secondary | ICD-10-CM | POA: Diagnosis not present

## 2019-07-04 DIAGNOSIS — M47812 Spondylosis without myelopathy or radiculopathy, cervical region: Secondary | ICD-10-CM | POA: Diagnosis not present

## 2019-07-10 NOTE — Progress Notes (Signed)
Cardiology Office Note   Date:  07/11/2019   ID:  Logan Wolfe, Logan Wolfe 28-Jan-1946, MRN [id number]  PCP:  Logan Cruel, MD    No chief complaint on file.  Aortic stenosis  Wt Readings from Last 3 Encounters:  07/11/19 215 lb 1.9 oz (97.6 kg)  03/06/17 237 lb 12.8 oz (107.9 kg)  02/23/14 240 lb (108.9 kg)       History of Present Illness: Logan Wolfe is a 74 y.o. male  who had transient confusion for 15 minutes in 2014. He could not read certain words. He had trouble spelling certain words as well. He saw Dr. Harrington Wolfe who sent him for MRI of his brain. This did not show evidence of recent embolic phenomenon at the time.  He has had a prior cerebellar infarct.   2014 echo showed: Left ventricle: The cavity size was normal. There was mild focal basal hypertrophy of the septum. Systolic function was normal. The estimated ejection fraction was in the range of 60% to 65%. Wall motion was normal; there were no regional wall motion abnormalities. - Aortic valve: There was mild stenosis by velocities, although leafelt excursion appearedmoderately restricted. Trivial regurgitation. - Mitral valve: Calcified annulus. Mildly calcified leaflets . Mild regurgitation.  Jan 2019: Normal LVEF. Aortic stenosis is still only mild.    Since the last visit, he go this COVID shots.  Denies : Chest pain. Dizziness. Leg edema. Nitroglycerin use. Orthopnea. Palpitations. Paroxysmal nocturnal dyspnea. Syncope.   Rare shortness of breath- not related to exertion.  He is not walking much due to a foot problem related to a callous.    High PSA managed with urology.      Past Medical History:  Diagnosis Date  . Allergic rhinitis, cause unspecified   . Anxiety   . Aortic valve calcification   . Cancer [facility])    Prostate CA, Dr Logan Wolfe 22 years ago  . Constipation   . Depressive disorder   . Dyspepsia and other specified disorders of function of stomach   . GERD  (gastroesophageal reflux disease)   . Hypoglycemia   . Memory loss   . Otitis    externa of right ear  . Spinal stenosis   . Unspecified pruritic disorder     Past Surgical History:  Procedure Laterality Date  . PROSTATE SURGERY       Current Outpatient Medications  Medication Sig Dispense Refill  . aspirin 325 MG tablet Take 325 mg by mouth daily.    . fluticasone (FLONASE) 50 MCG/ACT nasal spray Place 1 spray into the nose daily as needed.     Marland Kitchen ibuprofen (ADVIL,MOTRIN) 200 MG tablet Take 200 mg by mouth every 6 (six) hours as needed for pain.    Marland Kitchen MELATONIN PO Take 1 tablet by mouth at bedtime.    . pantoprazole (PROTONIX) 40 MG tablet Take 40 mg by mouth daily.  0   No current facility-administered medications for this visit.    Allergies:   Penicillins    Social History:  The patient  reports that he has been smoking cigars. He has smoked for the past 15.00 years. He has never used smokeless tobacco. He reports current alcohol use. He reports that he does not use drugs.   Family History:  The patient's family history includes Cancer in his father; Diabetes in his mother; Hyperlipidemia in his mother.    ROS:  Please see the history of present illness.   Otherwise, review of  systems are positive for occasional SHOB as noted above.   All other systems are reviewed and negative.    PHYSICAL EXAM: VS:  BP 116/68   Pulse 67   Ht 6\' 2"  (1.88 m)   Wt 215 lb 1.9 oz (97.6 kg)   SpO2 97%   BMI 27.62 kg/m  , BMI Body mass index is 27.62 kg/m. GEN: Well nourished, well developed, in no acute distress  HEENT: normal  Neck: no JVD, carotid bruits, or masses Cardiac: RRR; 2/6 systolic murmur, no rubs, or gallops,no edema  Respiratory:  clear to auscultation bilaterally, normal work of breathing GI: soft, nontender, nondistended, + BS MS: no deformity or atrophy  Skin: warm and dry, no rash Neuro:  Strength and sensation are intact Psych: euthymic mood, full  affect   EKG:   The ekg ordered today demonstrates NSR, no ST changes   Recent Labs: No results found for requested labs within last 8760 hours.   Lipid Panel    Component Value Date/Time   CHOL 210 (H) 12/27/2012 1109   TRIG 142 12/27/2012 1109   HDL 56 12/27/2012 1109   CHOLHDL 3.8 12/27/2012 1109   LDLCALC 126 (H) 12/27/2012 1109     Other studies Reviewed: Additional studies/ records that were reviewed today with results demonstrating:prior echo reviewed; no recent labs available. LDL 108 in 2019.   ASSESSMENT AND PLAN:  1. Aortic stenosis: No sx of severe disease.  Mild by last echo.  2. Carotid artery disease: Only minimal back in 2014.  He has had a prior cerebellar infarct.  Would recommend aspirin 81 mg daily. 3. GERD: He had some atypical chest pain in the past which improved with reflux medication.  No recent chest pain.  4. Urologist to check blood work.  Needs to see Dr. Harrington Wolfe for a physical.   Current medicines are reviewed at length with the patient today.  The patient concerns regarding his medicines were addressed.  The following changes have been made:  No change  Labs/ tests ordered today include:  No orders of the defined types were placed in this encounter.   Recommend 150 minutes/week of aerobic exercise Low fat, low carb, high fiber diet recommended  Disposition:   FU in 1 year   Signed, Logan Grooms, MD  07/11/2019 10:41 AM    [facility] [address], [place], [place]  [address] Phone: [phone]; Fax: [phone]

## 2019-07-11 ENCOUNTER — Other Ambulatory Visit: Payer: Self-pay

## 2019-07-11 ENCOUNTER — Ambulatory Visit: Payer: PPO | Admitting: Interventional Cardiology

## 2019-07-11 ENCOUNTER — Encounter: Payer: Self-pay | Admitting: Interventional Cardiology

## 2019-07-11 VITALS — BP 116/68 | HR 67 | Ht 74.0 in | Wt 215.1 lb

## 2019-07-11 DIAGNOSIS — I350 Nonrheumatic aortic (valve) stenosis: Secondary | ICD-10-CM | POA: Diagnosis not present

## 2019-07-11 DIAGNOSIS — K219 Gastro-esophageal reflux disease without esophagitis: Secondary | ICD-10-CM | POA: Diagnosis not present

## 2019-07-11 DIAGNOSIS — I6523 Occlusion and stenosis of bilateral carotid arteries: Secondary | ICD-10-CM | POA: Diagnosis not present

## 2019-07-11 MED ORDER — ASPIRIN EC 81 MG PO TBEC
81.0000 mg | DELAYED_RELEASE_TABLET | Freq: Every day | ORAL | Status: DC
Start: 2019-07-11 — End: 2021-08-29

## 2019-07-11 NOTE — Patient Instructions (Signed)
Medication Instructions:  Your physician has recommended you make the following change in your medication:  1-START taking Aspirin 81 mg by mouth daily.  *If you need a refill on your cardiac medications before your next appointment, please call your pharmacy*  Lab Work: If you have labs (blood work) drawn today and your tests are completely normal, you will receive your results only by: Marland Kitchen MyChart Message (if you have MyChart) OR . A paper copy in the mail If you have any lab test that is abnormal or we need to change your treatment, we will call you to review the results.  Follow-Up: At [facility], you and your health needs are our priority.  As part of our continuing mission to provide you with exceptional heart care, we have created designated Provider Care Teams.  These Care Teams include your primary Cardiologist (physician) and Advanced Practice Providers (APPs -  Physician Assistants and Nurse Practitioners) who all work together to provide you with the care you need, when you need it.  We recommend signing up for the patient portal called "MyChart".  Sign up information is provided on this After Visit Summary.  MyChart is used to connect with patients for Virtual Visits (Telemedicine).  Patients are able to view lab/test results, encounter notes, upcoming appointments, etc.  Non-urgent messages can be sent to your provider as well.   To learn more about what you can do with MyChart, go to [email or web address].    Your next appointment:   1 year(s)  The format for your next appointment:   In Person  Provider:   You may see Larae Grooms, MD or one of the following Advanced Practice Providers on your designated Care Team:    Melina Copa, PA-C  Ermalinda Barrios, PA-C    Other Instructions   High-Fiber Diet Fiber, also called dietary fiber, is a type of carbohydrate that is found in fruits, vegetables, whole grains, and beans. A high-fiber diet can have many health  benefits. Your health care provider may recommend a high-fiber diet to help:  Prevent constipation. Fiber can make your bowel movements more regular.  Lower your cholesterol.  Relieve the following conditions: ? Swelling of veins in the anus (hemorrhoids). ? Swelling and irritation (inflammation) of specific areas of the digestive tract (uncomplicated diverticulosis). ? A problem of the large intestine (colon) that sometimes causes pain and diarrhea (irritable bowel syndrome, IBS).  Prevent overeating as part of a weight-loss plan.  Prevent heart disease, type 2 diabetes, and certain cancers. What is my plan? The recommended daily fiber intake in grams (g) includes:  38 g for men age 58 or younger.  30 g for men over age 41.  13 g for women age 46 or younger.  21 g for women over age 64. You can get the recommended daily intake of dietary fiber by:  Eating a variety of fruits, vegetables, grains, and beans.  Taking a fiber supplement, if it is not possible to get enough fiber through your diet. What do I need to know about a high-fiber diet?  It is better to get fiber through food sources rather than from fiber supplements. There is not a lot of research about how effective supplements are.  Always check the fiber content on the nutrition facts label of any prepackaged food. Look for foods that contain 5 g of fiber or more per serving.  Talk with a diet and nutrition specialist (dietitian) if you have questions about specific foods that are recommended  or not recommended for your medical condition, especially if those foods are not listed below.  Gradually increase how much fiber you consume. If you increase your intake of dietary fiber too quickly, you may have bloating, cramping, or gas.  Drink plenty of water. Water helps you to digest fiber. What are tips for following this plan?  Eat a wide variety of high-fiber foods.  Make sure that half of the grains that you eat  each day are whole grains.  Eat breads and cereals that are made with whole-grain flour instead of refined flour or white flour.  Eat brown rice, bulgur wheat, or millet instead of white rice.  Start the day with a breakfast that is high in fiber, such as a cereal that contains 5 g of fiber or more per serving.  Use beans in place of meat in soups, salads, and pasta dishes.  Eat high-fiber snacks, such as berries, raw vegetables, nuts, and popcorn.  Choose whole fruits and vegetables instead of processed forms like juice or sauce. What foods can I eat?  Fruits Berries. Pears. Apples. Oranges. Avocado. Prunes and raisins. Dried figs. Vegetables Sweet potatoes. Spinach. Kale. Artichokes. Cabbage. Broccoli. Cauliflower. Green peas. Carrots. Squash. Grains Whole-grain breads. Multigrain cereal. Oats and oatmeal. Brown rice. Barley. Bulgur wheat. [place]. Quinoa. Bran muffins. Popcorn. Rye wafer crackers. Meats and other proteins Navy, kidney, and pinto beans. Soybeans. Split peas. Lentils. Nuts and seeds. Dairy Fiber-fortified yogurt. Beverages Fiber-fortified soy milk. Fiber-fortified orange juice. Other foods Fiber bars. The items listed above may not be a complete list of recommended foods and beverages. Contact a dietitian for more options. What foods are not recommended? Fruits Fruit juice. Cooked, strained fruit. Vegetables Fried potatoes. Canned vegetables. Well-cooked vegetables. Grains White bread. Pasta made with refined flour. White rice. Meats and other proteins Fatty cuts of meat. Fried chicken or fried fish. Dairy Milk. Yogurt. Cream cheese. Sour cream. Fats and oils Butters. Beverages Soft drinks. Other foods Cakes and pastries. The items listed above may not be a complete list of foods and beverages to avoid. Contact a dietitian for more information. Summary  Fiber is a type of carbohydrate. It is found in fruits, vegetables, whole grains, and  beans.  There are many health benefits of eating a high-fiber diet, such as preventing constipation, lowering blood cholesterol, helping with weight loss, and reducing your risk of heart disease, diabetes, and certain cancers.  Gradually increase your intake of fiber. Increasing too fast can result in cramping, bloating, and gas. Drink plenty of water while you increase your fiber.  The best sources of fiber include whole fruits and vegetables, whole grains, nuts, seeds, and beans. This information is not intended to replace advice given to you by your health care provider. Make sure you discuss any questions you have with your health care provider. Document Revised: 12/04/2016 Document Reviewed: 12/04/2016 Elsevier Patient Education  2020 [employer].

## 2019-07-16 DIAGNOSIS — C61 Malignant neoplasm of prostate: Secondary | ICD-10-CM | POA: Diagnosis not present

## 2019-07-21 ENCOUNTER — Other Ambulatory Visit: Payer: Self-pay

## 2019-07-21 ENCOUNTER — Encounter (HOSPITAL_COMMUNITY)
Admission: RE | Admit: 2019-07-21 | Discharge: 2019-07-21 | Disposition: A | Discharge status: Home / Self Care | Payer: PPO | Source: Ambulatory Visit | Attending: Urology | Admitting: Urology

## 2019-07-21 DIAGNOSIS — R972 Elevated prostate specific antigen [PSA]: Secondary | ICD-10-CM | POA: Diagnosis not present

## 2019-07-21 DIAGNOSIS — C61 Malignant neoplasm of prostate: Secondary | ICD-10-CM | POA: Diagnosis not present

## 2019-07-21 MED ORDER — TECHNETIUM TC 99M MEDRONATE IV KIT
20.6000 | PACK | Freq: Once | INTRAVENOUS | Status: AC
  Administered 2019-07-21: 20.6 via INTRAVENOUS

## 2019-07-23 DIAGNOSIS — N3946 Mixed incontinence: Secondary | ICD-10-CM | POA: Diagnosis not present

## 2019-07-23 DIAGNOSIS — C61 Malignant neoplasm of prostate: Secondary | ICD-10-CM | POA: Diagnosis not present

## 2019-07-23 DIAGNOSIS — R9721 Rising PSA following treatment for malignant neoplasm of prostate: Secondary | ICD-10-CM | POA: Diagnosis not present

## 2019-10-15 DIAGNOSIS — C61 Malignant neoplasm of prostate: Secondary | ICD-10-CM | POA: Diagnosis not present

## 2019-10-22 DIAGNOSIS — C61 Malignant neoplasm of prostate: Secondary | ICD-10-CM | POA: Diagnosis not present

## 2019-10-22 DIAGNOSIS — N3942 Incontinence without sensory awareness: Secondary | ICD-10-CM | POA: Diagnosis not present

## 2019-10-22 DIAGNOSIS — Z87442 Personal history of urinary calculi: Secondary | ICD-10-CM | POA: Diagnosis not present

## 2019-10-30 DIAGNOSIS — Z79899 Other long term (current) drug therapy: Secondary | ICD-10-CM | POA: Diagnosis not present

## 2019-10-30 DIAGNOSIS — M79606 Pain in leg, unspecified: Secondary | ICD-10-CM | POA: Diagnosis not present

## 2019-10-30 DIAGNOSIS — E7800 Pure hypercholesterolemia, unspecified: Secondary | ICD-10-CM | POA: Diagnosis not present

## 2019-10-30 DIAGNOSIS — Z23 Encounter for immunization: Secondary | ICD-10-CM | POA: Diagnosis not present

## 2019-11-11 ENCOUNTER — Other Ambulatory Visit: Payer: Self-pay

## 2019-11-11 ENCOUNTER — Ambulatory Visit: Payer: PPO | Attending: Family Medicine

## 2019-11-11 DIAGNOSIS — M7631 Iliotibial band syndrome, right leg: Secondary | ICD-10-CM | POA: Diagnosis not present

## 2019-11-11 DIAGNOSIS — M79604 Pain in right leg: Secondary | ICD-10-CM | POA: Insufficient documentation

## 2019-11-11 DIAGNOSIS — R293 Abnormal posture: Secondary | ICD-10-CM | POA: Insufficient documentation

## 2019-11-11 DIAGNOSIS — M6281 Muscle weakness (generalized): Secondary | ICD-10-CM | POA: Insufficient documentation

## 2019-11-11 DIAGNOSIS — R2689 Other abnormalities of gait and mobility: Secondary | ICD-10-CM | POA: Diagnosis not present

## 2019-11-11 NOTE — Therapy (Signed)
[place]. [place], [place], [address] Phone: [phone]   Fax:  [phone]  Physical Therapy Evaluation  Patient Details  Name: Logan Wolfe MRN: [id number] Date of Birth: 1945/02/17 Referring Provider (PT): Lawerance Cruel, MD   Encounter Date: 11/11/2019   PT End of Session - 11/11/19 1417    Visit Number 1    Number of Visits 12    Date for PT Re-Evaluation 12/23/19    Authorization Type [employer]    PT Start Time 1016    PT Stop Time 1102    PT Time Calculation (min) 46 min    Activity Tolerance Patient tolerated treatment well    Behavior During Therapy [facility] for tasks assessed/performed           Past Medical History:  Diagnosis Date  . Allergic rhinitis, cause unspecified   . Anxiety   . Aortic valve calcification   . Cancer [facility])    Prostate CA, Dr Jeffie Pollock 22 years ago  . Constipation   . Depressive disorder   . Dyspepsia and other specified disorders of function of stomach   . GERD (gastroesophageal reflux disease)   . Hypoglycemia   . Memory loss   . Otitis    externa of right ear  . Spinal stenosis   . Unspecified pruritic disorder     Past Surgical History:  Procedure Laterality Date  . PROSTATE SURGERY      There were no vitals filed for this visit.    Subjective Assessment - 11/11/19 1020    Subjective Pt reports 2 mo ago, he woke up and felt like he had overexercised his R leg. He was having pain in his outer and inner thigh, and occasionally on top. He took advil and it helped a little, but it has gotten progressively worse. He can't walk upstairs with it, he has to pull himself up if he gets on the floor. He was prescribed Celecoxib, and he is nervous about the side effects. "I'm not ready to be an invalid, I want to fix this".    Pertinent History Prostrate removed, active cancer (PSA 21, but unknown why and where - undergoing testing)    Limitations Other  (comment)   stairs, getting up/down from floor, squatting   How long can you walk comfortably? 1 mile    Diagnostic tests none    Patient Stated Goals I want to get better.    Currently in Pain? Yes    Pain Score 2    10/10 at the worst   Pain Location Leg    Pain Orientation Right;Lateral;Upper    Pain Descriptors / Indicators Discomfort    Pain Type Chronic pain    Pain Onset More than a month ago    Pain Frequency Intermittent    Effect of Pain on Daily Activities limits activities              [facility] PT Assessment - 11/11/19 0001      Assessment   Medical Diagnosis Right ITB Syndrome    Referring Provider (PT) Lawerance Cruel, MD    Onset Date/Surgical Date 09/10/19    Hand Dominance Right    Next MD Visit PRN    Prior Therapy A couple of times      Balance Screen   Has the patient fallen in the past 6 months No    Has the patient had a decrease in activity level because of  a fear of falling?  Yes    Is the patient reluctant to leave their home because of a fear of falling?  No      Home Ecologist residence    Living Arrangements Spouse/significant other    Available Help at Discharge Family    Type of [place] Two level      Prior Function   Level of [place] Retired    Building services engineer Tests   Functional tests Other      Other:   Other/ Comments Stairs: no pain on 4", pain with step ups on 6" steps with B HR      Posture/Postural Control   Posture Comments decr hip extension in standing, mild fwd flexed posture      ROM / Strength   AROM / PROM / Strength AROM;PROM;Strength      AROM   Overall AROM Comments knee WFL (initial cramping in HS)    AROM Assessment Site Hip    Right/Left Hip Right    Right Hip Flexion 104   discomfort     PROM   PROM Assessment Site Hip    Right/Left Hip Right    Right Hip External Rotation  30    Right Hip Internal Rotation  45       Strength   Strength Assessment Site Hip;Knee    Right/Left Hip Right;Left    Right Hip Flexion 4/5    Right Hip Extension 3-/5    Right Hip External Rotation  3+/5    Right Hip ABduction 3/5    Left Hip Flexion 4+/5    Right/Left Knee Right;Left    Right Knee Flexion 4/5    Right Knee Extension 4+/5    Left Knee Flexion 4+/5    Left Knee Extension 4+/5      Flexibility   Soft Tissue Assessment /Muscle Length yes    Quadriceps tight    ITB + Ober's knee bent     Piriformis tight      Palpation   Palpation comment hypoextensible hip flexors, VL      Ambulation/Gait   Gait Comments L hip drop during gait around clinic no AD                      Objective measurements completed on examination: See above findings.               PT Education - 11/11/19 1417    Education Details Diagnosis, prognosis, POC, HEP    Person(s) Educated Patient    Methods Explanation;Demonstration;Tactile cues;Verbal cues;Handout    Comprehension Returned demonstration;Verbalized understanding;Tactile cues required;Verbal cues required            PT Short Term Goals - 11/11/19 1424      PT SHORT TERM GOAL #1   Title Pt will be I and compliant with initial HEP.    Time 2    Period Weeks    Status New    Target Date 11/25/19             PT Long Term Goals - 11/11/19 1425      PT LONG TERM GOAL #1   Title Pt will be I and compliant with long term HEP for maintenance and injury prevention.    Time 6    Period Weeks    Status New  Target Date 12/23/19      PT LONG TERM GOAL #2   Title Pt will increase R hip abductor/extensor MMT to at least 4/5 to support normalized gait pattern and stairs negotiation.    Time 6    Period Weeks    Status New    Target Date 12/23/19      PT LONG TERM GOAL #3   Title Pt will walk >2 miles without any hip pain for increased leisure activity and exercise.    Baseline 1 mi    Time 6    Status New    Target Date  12/23/19      PT LONG TERM GOAL #4   Title Pt will negotiate 1 flight of stairs with reciprocal pattern and no pain for home access.    Time 6    Period Weeks    Status New    Target Date 12/23/19      PT LONG TERM GOAL #5   Title Pt will report no pain with ADLs/IADLs for normal life participation.    Time 6    Period Weeks    Status New    Target Date 12/23/19                  Plan - 11/11/19 1418    Clinical Impression Statement Pt is a 74 yo male who presents to OP PT with 2 month history of R IT band pain, unknown onset, insidious in nature. Pt is limited in walking more than 1 mile, negotiating steps, squatting, and getting onto/off of floor. Pt continues to play leisure golf with no issues. He has active cancer that he is being followed for currently. Pt demonstrates impairments in hip flexor/quad/TFL flexibility, glute strength, balance, gait, hip ER ROM, posture, and body mechanics. Pt was edu on diagnosis, prognosis, POC and HEP verbalizing understanding and consent to tx. Pt will benefit from skilled PT 1-2/week for 6 weeks to address impairments and restore PLOF.    Personal Factors and Comorbidities Comorbidity 3+;Age;Past/Current Experience    Comorbidities Active cx, anxiety, depression    Examination-Activity Limitations Bend;Lift;Squat;Stairs;Locomotion Level;Transfers    Examination-Participation Restrictions Community Activity;Interpersonal Relationship    Stability/Clinical Decision Making Evolving/Moderate complexity    Clinical Decision Making Moderate    Rehab Potential Good    PT Frequency 2x / week    PT Duration 6 weeks    PT Treatment/Interventions ADLs/Self Care Home Management;Electrical Stimulation;Vasopneumatic Device;Cryotherapy;Moist Heat;Iontophoresis 4mg /ml Dexamethasone;Gait training;Stair training;Functional mobility training;Therapeutic activities;Neuromuscular re-education;Balance training;Therapeutic exercise;Patient/family  education;Passive range of motion;Manual techniques;Dry needling    PT Next Visit Plan FOTO, assess HEP, progress hip/LE flexibility and proximal core/glute strength, gait/stairs training    PT Home Exercise Plan [id number]: PPT, HF S EOB, Figure 4 S, KTOS Pirif S, H/L iso clams c RTB    Consulted and Agree with Plan of Care Patient           Patient will benefit from skilled therapeutic intervention in order to improve the following deficits and impairments:  Abnormal gait, Decreased balance, Difficulty walking, Hypomobility, Improper body mechanics, Impaired perceived functional ability, Decreased range of motion, Decreased strength, Increased fascial restricitons, Impaired flexibility, Postural dysfunction, Pain  Visit Diagnosis: It band syndrome, right - Plan: PT plan of care cert/re-cert  Pain in right leg - Plan: PT plan of care cert/re-cert  Abnormal posture - Plan: PT plan of care cert/re-cert  Other abnormalities of gait and mobility - Plan: PT plan of care cert/re-cert  Muscle weakness (generalized) -  Plan: PT plan of care cert/re-cert     Problem List Patient Active Problem List   Diagnosis Date Noted  . Acute, but ill-defined, cerebrovascular disease 12/27/2012  . 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 Aortic valve disorder 12/05/2012  . Cancer ([place])   . GERD (gastroesophageal reflux disease)   . Hypoglycemia   . Depressive disorder   . Otitis     Izell La Ward, PT, DPT 11/11/2019, 2:30 PM  [place]. [place], [place], [address] Phone: [phone]   Fax:  [phone]  Name: Logan Wolfe MRN: [id number] Date of Birth: 12/11/1945

## 2019-11-17 ENCOUNTER — Other Ambulatory Visit: Payer: Self-pay

## 2019-11-17 ENCOUNTER — Encounter: Payer: Self-pay | Admitting: Physical Therapy

## 2019-11-17 ENCOUNTER — Ambulatory Visit: Payer: PPO | Attending: Family Medicine | Admitting: Physical Therapy

## 2019-11-17 DIAGNOSIS — M79604 Pain in right leg: Secondary | ICD-10-CM | POA: Diagnosis not present

## 2019-11-17 DIAGNOSIS — M6281 Muscle weakness (generalized): Secondary | ICD-10-CM | POA: Insufficient documentation

## 2019-11-17 DIAGNOSIS — R293 Abnormal posture: Secondary | ICD-10-CM | POA: Diagnosis not present

## 2019-11-17 DIAGNOSIS — R2689 Other abnormalities of gait and mobility: Secondary | ICD-10-CM | POA: Diagnosis not present

## 2019-11-17 DIAGNOSIS — M7631 Iliotibial band syndrome, right leg: Secondary | ICD-10-CM | POA: Diagnosis not present

## 2019-11-17 NOTE — Therapy (Signed)
[place]. [place], [place], [address] Phone: [phone]   Fax:  [phone]  Physical Therapy Treatment  Patient Details  Name: Logan Wolfe MRN: [id number] Date of Birth: 08/21/1945 Referring Provider (PT): Lawerance Cruel, MD   Encounter Date: 11/17/2019   PT End of Session - 11/17/19 1110    Visit Number 2    Number of Visits 12    Date for PT Re-Evaluation 12/23/19    PT Start Time 1013    PT Stop Time 1059    PT Time Calculation (min) 46 min    Activity Tolerance Patient tolerated treatment well    Behavior During Therapy [facility] for tasks assessed/performed           Past Medical History:  Diagnosis Date  . Allergic rhinitis, cause unspecified   . Anxiety   . Aortic valve calcification   . Cancer [facility])    Prostate CA, Dr Logan Wolfe 22 years ago  . Constipation   . Depressive disorder   . Dyspepsia and other specified disorders of function of stomach   . GERD (gastroesophageal reflux disease)   . Hypoglycemia   . Memory loss   . Otitis    externa of right ear  . Spinal stenosis   . Unspecified pruritic disorder     Past Surgical History:  Procedure Laterality Date  . PROSTATE SURGERY      There were no vitals filed for this visit.   Subjective Assessment - 11/17/19 1014    Subjective Pt reports feeling about the same; reports most trouble with stairs. Says he has to pull himself up the stairs.    Currently in Pain? Yes    Pain Score 2     Pain Location Leg    Pain Orientation Right;Lateral              [facility] PT Assessment - 11/17/19 0001      Observation/Other Assessments   Focus on Therapeutic Outcomes (FOTO)  40% limited                         [facility] Adult PT Treatment/Exercise - 11/17/19 0001      Exercises   Exercises Knee/Hip      Knee/Hip Exercises: Stretches   Passive Hamstring Stretch 20 seconds;Right;4 reps    ITB Stretch Right;4 reps;20 seconds      Piriformis Stretch Right;4 reps;20 seconds    Gastroc Stretch Both;1 rep;30 seconds      Knee/Hip Exercises: Aerobic   Recumbent Bike L1-L2 x 6 min      Knee/Hip Exercises: Standing   Heel Raises Both;1 set;15 reps    Hip Flexion Both;1 set;10 reps    Hip Flexion Limitations 2.5#    Hip Abduction Both;10 reps;1 set    Abduction Limitations 2.5#    Hip Extension Both;1 set;10 reps    Extension Limitations 2.5#      Knee/Hip Exercises: Seated   Sit to Sand 20 reps;without UE support   from elevated mat table with yellow ball chest press     Knee/Hip Exercises: Supine   Other Supine Knee/Hip Exercises dktc with isometric abs x10, LTR on exercise ball                    PT Short Term Goals - 11/17/19 1114      PT SHORT TERM GOAL #1   Title Pt will be I and  compliant with initial HEP.    Time 2    Status Achieved             PT Long Term Goals - 11/11/19 1425      PT LONG TERM GOAL #1   Title Pt will be I and compliant with long term HEP for maintenance and injury prevention.    Time 6    Period Weeks    Status New    Target Date 12/23/19      PT LONG TERM GOAL #2   Title Pt will increase R hip abductor/extensor MMT to at least 4/5 to support normalized gait pattern and stairs negotiation.    Time 6    Period Weeks    Status New    Target Date 12/23/19      PT LONG TERM GOAL #3   Title Pt will walk >2 miles without any hip pain for increased leisure activity and exercise.    Baseline 1 mi    Time 6    Status New    Target Date 12/23/19      PT LONG TERM GOAL #4   Title Pt will negotiate 1 flight of stairs with reciprocal pattern and no pain for home access.    Time 6    Period Weeks    Status New    Target Date 12/23/19      PT LONG TERM GOAL #5   Title Pt will report no pain with ADLs/IADLs for normal life participation.    Time 6    Period Weeks    Status New    Target Date 12/23/19                 Plan - 11/17/19 1112     Clinical Impression Statement Pt demos significant tightness in B hips/glutes R>L; manual stretching to address this. Pt tolerated progression to PT well; some complaints of increased R hip pain with standing hip abduction and extension. Pt demos functional weakness in R hip with mild trendelenburg noted with gait. Pt able to stand with no UE support from elevated mat table; updated HEP to include functional strengthening.    PT Treatment/Interventions ADLs/Self Care Home Management;Electrical Stimulation;Vasopneumatic Device;Cryotherapy;Moist Heat;Iontophoresis 4mg /ml Dexamethasone;Gait training;Stair training;Functional mobility training;Therapeutic activities;Neuromuscular re-education;Balance training;Therapeutic exercise;Patient/family education;Passive range of motion;Manual techniques;Dry needling    PT Next Visit Plan progress hip/LE flexibility and proximal core/glute strength, gait/stairs training    Consulted and Agree with Plan of Care Patient           Patient will benefit from skilled therapeutic intervention in order to improve the following deficits and impairments:  Abnormal gait, Decreased balance, Difficulty walking, Hypomobility, Improper body mechanics, Impaired perceived functional ability, Decreased range of motion, Decreased strength, Increased fascial restricitons, Impaired flexibility, Postural dysfunction, Pain  Visit Diagnosis: It band syndrome, right  Pain in right leg  Abnormal posture  Other abnormalities of gait and mobility  Muscle weakness (generalized)     Problem List Patient Active Problem List   Diagnosis Date Noted  . Acute, but ill-defined, cerebrovascular disease 12/27/2012  . 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 Aortic valve disorder 12/05/2012  . Cancer ([place])   . GERD (gastroesophageal reflux disease)   . Hypoglycemia   . Depressive disorder   . Otitis    Amador Cunas, PT,  DPT Donald Prose Logan Wolfe 11/17/2019, 11:15 AM  [place].  [place], [place], [address] Phone: [phone]   Fax:  [phone]  Name: Logan Wolfe MRN: [id number] Date of Birth: 03-Oct-1945

## 2019-11-19 ENCOUNTER — Ambulatory Visit: Payer: PPO | Admitting: Physical Therapy

## 2019-11-19 ENCOUNTER — Encounter: Payer: Self-pay | Admitting: Physical Therapy

## 2019-11-19 ENCOUNTER — Other Ambulatory Visit: Payer: Self-pay

## 2019-11-19 DIAGNOSIS — M7631 Iliotibial band syndrome, right leg: Secondary | ICD-10-CM | POA: Diagnosis not present

## 2019-11-19 DIAGNOSIS — R2689 Other abnormalities of gait and mobility: Secondary | ICD-10-CM

## 2019-11-19 DIAGNOSIS — M79604 Pain in right leg: Secondary | ICD-10-CM

## 2019-11-19 DIAGNOSIS — R293 Abnormal posture: Secondary | ICD-10-CM

## 2019-11-19 DIAGNOSIS — M6281 Muscle weakness (generalized): Secondary | ICD-10-CM

## 2019-11-19 NOTE — Therapy (Signed)
[place]. [place], [place], [address] Phone: [phone]   Fax:  [phone]  Physical Therapy Treatment  Patient Details  Name: Logan Wolfe MRN: [id number] Date of Birth: Apr 14, 1945 Referring Provider (PT): Lawerance Cruel, MD   Encounter Date: 11/19/2019   PT End of Session - 11/19/19 1446    Visit Number 3    Number of Visits 12    Date for PT Re-Evaluation 12/23/19    PT Start Time 2703    PT Stop Time 1444    PT Time Calculation (min) 46 min    Activity Tolerance Patient tolerated treatment well    Behavior During Therapy [facility] for tasks assessed/performed           Past Medical History:  Diagnosis Date  . Allergic rhinitis, cause unspecified   . Anxiety   . Aortic valve calcification   . Cancer [facility])    Prostate CA, Dr Jeffie Pollock 22 years ago  . Constipation   . Depressive disorder   . Dyspepsia and other specified disorders of function of stomach   . GERD (gastroesophageal reflux disease)   . Hypoglycemia   . Memory loss   . Otitis    externa of right ear  . Spinal stenosis   . Unspecified pruritic disorder     Past Surgical History:  Procedure Laterality Date  . PROSTATE SURGERY      There were no vitals filed for this visit.   Subjective Assessment - 11/19/19 1356    Subjective Pt reports he is feeling a little better. Had a lot of difficulty/pain with STS last time but otherwise feels like everything was good.    Currently in Pain? No/denies    Pain Score 0-No pain    Pain Location Leg    Pain Orientation Right                             [facility] Adult PT Treatment/Exercise - 11/19/19 0001      Knee/Hip Exercises: Stretches   Active Hamstring Stretch Both;1 rep;30 seconds    Gastroc Stretch Both;1 rep;30 seconds      Knee/Hip Exercises: Aerobic   Nustep L5 x 6 min      Knee/Hip Exercises: Machines for Strengthening   Cybex Knee Extension 15# 2x15 BLE     Cybex Knee Flexion 35# 2x12 BLE    Cybex Leg Press 40# 2x15 BLE      Knee/Hip Exercises: Standing   Heel Raises Both;1 set;15 reps    Forward Step Up Both;1 set;10 reps;Hand Hold: 0;Step Height: 6"    Stairs x4 up/down some pain in R leg ascending 6" stair    Other Standing Knee Exercises resisted gait 30# x4 each direction    Other Standing Knee Exercises heel taps 4" stair 2x10 B      Knee/Hip Exercises: Seated   Sit to Sand 20 reps;without UE support   yellow ball chest press                   PT Short Term Goals - 11/17/19 1114      PT SHORT TERM GOAL #1   Title Pt will be I and compliant with initial HEP.    Time 2    Status Achieved             PT Long Term Goals - 11/11/19 1425  PT LONG TERM GOAL #1   Title Pt will be I and compliant with long term HEP for maintenance and injury prevention.    Time 6    Period Weeks    Status New    Target Date 12/23/19      PT LONG TERM GOAL #2   Title Pt will increase R hip abductor/extensor MMT to at least 4/5 to support normalized gait pattern and stairs negotiation.    Time 6    Period Weeks    Status New    Target Date 12/23/19      PT LONG TERM GOAL #3   Title Pt will walk >2 miles without any hip pain for increased leisure activity and exercise.    Baseline 1 mi    Time 6    Status New    Target Date 12/23/19      PT LONG TERM GOAL #4   Title Pt will negotiate 1 flight of stairs with reciprocal pattern and no pain for home access.    Time 6    Period Weeks    Status New    Target Date 12/23/19      PT LONG TERM GOAL #5   Title Pt will report no pain with ADLs/IADLs for normal life participation.    Time 6    Period Weeks    Status New    Target Date 12/23/19                 Plan - 11/19/19 1447    Clinical Impression Statement Pt did well with progression to machine ex's with no reports of increased R hip/leg pain. Pt did have pain with eccentric heel taps from 4" stair, STS, and  ascending 6" stair. Pt demos some functional RLE weakness with weightbearing activities. Occasional CGA for sidestepping with resisted gait.    PT Treatment/Interventions ADLs/Self Care Home Management;Electrical Stimulation;Vasopneumatic Device;Cryotherapy;Moist Heat;Iontophoresis 4mg /ml Dexamethasone;Gait training;Stair training;Functional mobility training;Therapeutic activities;Neuromuscular re-education;Balance training;Therapeutic exercise;Patient/family education;Passive range of motion;Manual techniques;Dry needling    PT Next Visit Plan progress hip/LE flexibility and proximal core/glute strength, gait/stairs training    Consulted and Agree with Plan of Care Patient           Patient will benefit from skilled therapeutic intervention in order to improve the following deficits and impairments:  Abnormal gait, Decreased balance, Difficulty walking, Hypomobility, Improper body mechanics, Impaired perceived functional ability, Decreased range of motion, Decreased strength, Increased fascial restricitons, Impaired flexibility, Postural dysfunction, Pain  Visit Diagnosis: It band syndrome, right  Pain in right leg  Abnormal posture  Other abnormalities of gait and mobility  Muscle weakness (generalized)     Problem List Patient Active Problem List   Diagnosis Date Noted  . Acute, but ill-defined, cerebrovascular disease 12/27/2012  . 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 Aortic valve disorder 12/05/2012  . Cancer ([place])   . GERD (gastroesophageal reflux disease)   . Hypoglycemia   . Depressive disorder   . Otitis    Amador Cunas, PT, DPT Donald Prose Deonne Rooks 11/19/2019, 2:49 PM  [place]. [place], [place], [address] Phone: [phone]   Fax:  [phone]  Name: Logan Wolfe MRN: [id number] Date of Birth: 19-Jul-1945

## 2019-11-24 ENCOUNTER — Other Ambulatory Visit: Payer: Self-pay

## 2019-11-24 ENCOUNTER — Ambulatory Visit: Payer: PPO | Admitting: Physical Therapy

## 2019-11-24 ENCOUNTER — Encounter: Payer: Self-pay | Admitting: Physical Therapy

## 2019-11-24 DIAGNOSIS — R2689 Other abnormalities of gait and mobility: Secondary | ICD-10-CM

## 2019-11-24 DIAGNOSIS — M7631 Iliotibial band syndrome, right leg: Secondary | ICD-10-CM | POA: Diagnosis not present

## 2019-11-24 DIAGNOSIS — R293 Abnormal posture: Secondary | ICD-10-CM

## 2019-11-24 DIAGNOSIS — M6281 Muscle weakness (generalized): Secondary | ICD-10-CM

## 2019-11-24 DIAGNOSIS — M79604 Pain in right leg: Secondary | ICD-10-CM

## 2019-11-24 NOTE — Therapy (Signed)
[place]. [place], [place], [address] Phone: [phone]   Fax:  [phone]  Physical Therapy Treatment  Patient Details  Name: FORRESTER BLANDO MRN: [id number] Date of Birth: [birth year] Referring Provider (PT): Lawerance Cruel, MD   Encounter Date: 11/24/2019   PT End of Session - 11/24/19 1012    Visit Number 4    Number of Visits 12    Date for PT Re-Evaluation 12/23/19    PT Start Time 0929    PT Stop Time 1013    PT Time Calculation (min) 44 min    Activity Tolerance Patient tolerated treatment well    Behavior During Therapy [facility] for tasks assessed/performed           Past Medical History:  Diagnosis Date  . Allergic rhinitis, cause unspecified   . Anxiety   . Aortic valve calcification   . Cancer [facility])    Prostate CA, Dr Jeffie Pollock 22 years ago  . Constipation   . Depressive disorder   . Dyspepsia and other specified disorders of function of stomach   . GERD (gastroesophageal reflux disease)   . Hypoglycemia   . Memory loss   . Otitis    externa of right ear  . Spinal stenosis   . Unspecified pruritic disorder     Past Surgical History:  Procedure Laterality Date  . PROSTATE SURGERY      There were no vitals filed for this visit.   Subjective Assessment - 11/24/19 0930    Subjective Pt reports a little sore from overdoing it this weekend but good overall    Currently in Pain? No/denies                             [facility] Adult PT Treatment/Exercise - 11/24/19 0001      Knee/Hip Exercises: Aerobic   Recumbent Bike L1-L2 x 6 min    Nustep L5 x 6 min      Knee/Hip Exercises: Machines for Strengthening   Cybex Knee Extension 15# 2x15 BLE    Cybex Knee Flexion 35# 2x12 BLE    Cybex Leg Press 40# 2x15 BLE      Knee/Hip Exercises: Standing   Heel Raises Both;1 set;15 reps    Lateral Step Up Both;1 set;10 reps;Hand Hold: 1;Step Height: 6"    Forward Step Up Both;1  set;10 reps;Hand Hold: 0;Step Height: 6"    Other Standing Knee Exercises resisted gait 30# x4 each direction    Other Standing Knee Exercises heel taps 4" stair 2x10 B      Knee/Hip Exercises: Seated   Sit to Sand 20 reps;without UE support                    PT Short Term Goals - 11/17/19 1114      PT SHORT TERM GOAL #1   Title Pt will be I and compliant with initial HEP.    Time 2    Status Achieved             PT Long Term Goals - 11/11/19 1425      PT LONG TERM GOAL #1   Title Pt will be I and compliant with long term HEP for maintenance and injury prevention.    Time 6    Period Weeks    Status New    Target Date 12/23/19      PT LONG  TERM GOAL #2   Title Pt will increase R hip abductor/extensor MMT to at least 4/5 to support normalized gait pattern and stairs negotiation.    Time 6    Period Weeks    Status New    Target Date 12/23/19      PT LONG TERM GOAL #3   Title Pt will walk >2 miles without any hip pain for increased leisure activity and exercise.    Baseline 1 mi    Time 6    Status New    Target Date 12/23/19      PT LONG TERM GOAL #4   Title Pt will negotiate 1 flight of stairs with reciprocal pattern and no pain for home access.    Time 6    Period Weeks    Status New    Target Date 12/23/19      PT LONG TERM GOAL #5   Title Pt will report no pain with ADLs/IADLs for normal life participation.    Time 6    Period Weeks    Status New    Target Date 12/23/19                 Plan - 11/24/19 1013    Clinical Impression Statement Pt needing occasional CGA for sidestepping with resisted gait; able to self correct more this rx. Pain with eccentric heel taps from 4" stair. Did well with all other interventions; reporting some soreness/fatigue at end of rx. Demos hip drop with gait and cuing to avoid lateral lean.    PT Treatment/Interventions ADLs/Self Care Home Management;Electrical Stimulation;Vasopneumatic  Device;Cryotherapy;Moist Heat;Iontophoresis 4mg /ml Dexamethasone;Gait training;Stair training;Functional mobility training;Therapeutic activities;Neuromuscular re-education;Balance training;Therapeutic exercise;Patient/family education;Passive range of motion;Manual techniques;Dry needling    PT Next Visit Plan progress hip/LE flexibility and proximal core/glute strength, gait/stairs training    Consulted and Agree with Plan of Care Patient           Patient will benefit from skilled therapeutic intervention in order to improve the following deficits and impairments:  Abnormal gait, Decreased balance, Difficulty walking, Hypomobility, Improper body mechanics, Impaired perceived functional ability, Decreased range of motion, Decreased strength, Increased fascial restricitons, Impaired flexibility, Postural dysfunction, Pain  Visit Diagnosis: It band syndrome, right  Pain in right leg  Abnormal posture  Other abnormalities of gait and mobility  Muscle weakness (generalized)     Problem List Patient Active Problem List   Diagnosis Date Noted  . Acute, but ill-defined, cerebrovascular disease 12/27/2012  . 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 Aortic valve disorder 12/05/2012  . Cancer ([place])   . GERD (gastroesophageal reflux disease)   . Hypoglycemia   . Depressive disorder   . Otitis    Amador Cunas, PT, DPT [facility] [place]ires 11/24/2019, 10:15 AM  [place]. [place], [place], [address] Phone: [phone]   Fax:  [phone]  Name: ELDEAN NANNA MRN: [id number] Date of Birth: [birth year]

## 2019-11-26 ENCOUNTER — Other Ambulatory Visit: Payer: Self-pay

## 2019-11-26 ENCOUNTER — Ambulatory Visit: Payer: PPO | Admitting: Physical Therapy

## 2019-11-26 ENCOUNTER — Encounter: Payer: Self-pay | Admitting: Physical Therapy

## 2019-11-26 DIAGNOSIS — M7631 Iliotibial band syndrome, right leg: Secondary | ICD-10-CM

## 2019-11-26 DIAGNOSIS — M6281 Muscle weakness (generalized): Secondary | ICD-10-CM

## 2019-11-26 DIAGNOSIS — M79604 Pain in right leg: Secondary | ICD-10-CM

## 2019-11-26 DIAGNOSIS — R293 Abnormal posture: Secondary | ICD-10-CM

## 2019-11-26 DIAGNOSIS — R2689 Other abnormalities of gait and mobility: Secondary | ICD-10-CM

## 2019-11-26 NOTE — Therapy (Signed)
[place]. [place], [place], [address] Phone: [phone]   Fax:  [phone]  Physical Therapy Treatment  Patient Details  Name: Logan Wolfe MRN: [id number] Date of Birth: [birth year] Referring Provider (PT): Lawerance Cruel, MD   Encounter Date: 11/26/2019   PT End of Session - 11/26/19 1009    Visit Number 5    Number of Visits 12    Date for PT Re-Evaluation 12/23/19    PT Start Time 0931    PT Stop Time 1018    PT Time Calculation (min) 47 min    Activity Tolerance Patient tolerated treatment well    Behavior During Therapy [facility] for tasks assessed/performed           Past Medical History:  Diagnosis Date  . Allergic rhinitis, cause unspecified   . Anxiety   . Aortic valve calcification   . Cancer [facility])    Prostate CA, Dr Jeffie Pollock 22 years ago  . Constipation   . Depressive disorder   . Dyspepsia and other specified disorders of function of stomach   . GERD (gastroesophageal reflux disease)   . Hypoglycemia   . Memory loss   . Otitis    externa of right ear  . Spinal stenosis   . Unspecified pruritic disorder     Past Surgical History:  Procedure Laterality Date  . PROSTATE SURGERY      There were no vitals filed for this visit.   Subjective Assessment - 11/26/19 0931    Subjective Pt reports he feels like overdid it a couple of days ago and hip is a more sore with normal with stairs    Currently in Pain? No/denies   states no pain just soreness                            [facility] Adult PT Treatment/Exercise - 11/26/19 0001      Knee/Hip Exercises: Stretches   Passive Hamstring Stretch 20 seconds;Right;4 reps    ITB Stretch Right;4 reps;20 seconds    Piriformis Stretch Right;4 reps;20 seconds    Other Knee/Hip Stretches hip adduction stretch x4 20 sec      Knee/Hip Exercises: Aerobic   Recumbent Bike L1-L2 x 6 min      Knee/Hip Exercises: Supine   Bridges  Both;2 sets;10 reps    Bridges Limitations with PPT    Straight Leg Raises Both;2 sets;10 reps    Straight Leg Raises Limitations SLR with abduction    Other Supine Knee/Hip Exercises supine clamshell 2x10 B      Modalities   Modalities Moist Heat      Moist Heat Therapy   Number Minutes Moist Heat 10 Minutes    Moist Heat Location Hip                    PT Short Term Goals - 11/17/19 1114      PT SHORT TERM GOAL #1   Title Pt will be I and compliant with initial HEP.    Time 2    Status Achieved             PT Long Term Goals - 11/11/19 1425      PT LONG TERM GOAL #1   Title Pt will be I and compliant with long term HEP for maintenance and injury prevention.    Time 6    Period Weeks  Status New    Target Date 12/23/19      PT LONG TERM GOAL #2   Title Pt will increase R hip abductor/extensor MMT to at least 4/5 to support normalized gait pattern and stairs negotiation.    Time 6    Period Weeks    Status New    Target Date 12/23/19      PT LONG TERM GOAL #3   Title Pt will walk >2 miles without any hip pain for increased leisure activity and exercise.    Baseline 1 mi    Time 6    Status New    Target Date 12/23/19      PT LONG TERM GOAL #4   Title Pt will negotiate 1 flight of stairs with reciprocal pattern and no pain for home access.    Time 6    Period Weeks    Status New    Target Date 12/23/19      PT LONG TERM GOAL #5   Title Pt will report no pain with ADLs/IADLs for normal life participation.    Time 6    Period Weeks    Status New    Target Date 12/23/19                 Plan - 11/26/19 1010    Clinical Impression Statement Focused today's tx on manual stretching and supine ex's d/t pt reporting increased R hip pain with stairs and standing activities. Pt very tender to palpation over R greater trochanter; pt reporting pain as muscular pain that eases with rest and increases with use. Pt has active cancer and had recent  bone scan which showed no active cancer in bones at this moment. Pt demos very tight R hamstring with stretching.    PT Treatment/Interventions ADLs/Self Care Home Management;Electrical Stimulation;Vasopneumatic Device;Cryotherapy;Moist Heat;Iontophoresis 4mg /ml Dexamethasone;Gait training;Stair training;Functional mobility training;Therapeutic activities;Neuromuscular re-education;Balance training;Therapeutic exercise;Patient/family education;Passive range of motion;Manual techniques;Dry needling    PT Next Visit Plan progress hip/LE flexibility and proximal core/glute strength, gait/stairs training    Consulted and Agree with Plan of Care Patient           Patient will benefit from skilled therapeutic intervention in order to improve the following deficits and impairments:  Abnormal gait, Decreased balance, Difficulty walking, Hypomobility, Improper body mechanics, Impaired perceived functional ability, Decreased range of motion, Decreased strength, Increased fascial restricitons, Impaired flexibility, Postural dysfunction, Pain  Visit Diagnosis: It band syndrome, right  Pain in right leg  Abnormal posture  Other abnormalities of gait and mobility  Muscle weakness (generalized)     Problem List Patient Active Problem List   Diagnosis Date Noted  . Acute, but ill-defined, cerebrovascular disease 12/27/2012  . 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 Aortic valve disorder 12/05/2012  . Cancer ([place])   . GERD (gastroesophageal reflux disease)   . Hypoglycemia   . Depressive disorder   . Otitis    Amador Cunas, PT, DPT [facility] [place] 11/26/2019, 10:13 AM  [place]. [place], [place], [address] Phone: [phone]   Fax:  [phone]  Name: Logan Wolfe MRN: [id number] Date of Birth: [birth year]

## 2019-12-01 ENCOUNTER — Ambulatory Visit: Payer: PPO | Admitting: Physical Therapy

## 2019-12-01 ENCOUNTER — Encounter: Payer: Self-pay | Admitting: Physical Therapy

## 2019-12-01 ENCOUNTER — Other Ambulatory Visit: Payer: Self-pay

## 2019-12-01 DIAGNOSIS — M7631 Iliotibial band syndrome, right leg: Secondary | ICD-10-CM

## 2019-12-01 DIAGNOSIS — M79604 Pain in right leg: Secondary | ICD-10-CM

## 2019-12-01 DIAGNOSIS — R2689 Other abnormalities of gait and mobility: Secondary | ICD-10-CM

## 2019-12-01 DIAGNOSIS — M6281 Muscle weakness (generalized): Secondary | ICD-10-CM

## 2019-12-01 DIAGNOSIS — R293 Abnormal posture: Secondary | ICD-10-CM

## 2019-12-01 NOTE — Therapy (Signed)
[place]. [place], [place], [address] Phone: [phone]   Fax:  [phone]  Physical Therapy Treatment  Patient Details  Name: Logan Wolfe MRN: [id number] Date of Birth: October 19, 1945 Referring Provider (PT): Lawerance Cruel, MD   Encounter Date: 12/01/2019   PT End of Session - 12/01/19 1013    Visit Number 6    Number of Visits 12    Date for PT Re-Evaluation 12/23/19    PT Start Time 0930    PT Stop Time 4854    PT Time Calculation (min) 44 min    Activity Tolerance Patient tolerated treatment well    Behavior During Therapy [facility] for tasks assessed/performed           Past Medical History:  Diagnosis Date   Allergic rhinitis, cause unspecified    Anxiety    Aortic valve calcification    Cancer ([place])    Prostate CA, Dr Jeffie Pollock 22 years ago   Constipation    Depressive disorder    Dyspepsia and other specified disorders of function of stomach    GERD (gastroesophageal reflux disease)    Hypoglycemia    Memory loss    Otitis    externa of right ear   Spinal stenosis    Unspecified pruritic disorder     Past Surgical History:  Procedure Laterality Date   PROSTATE SURGERY      There were no vitals filed for this visit.   Subjective Assessment - 12/01/19 0932    Subjective Pt reports he feels pretty good today with no pain    Currently in Pain? No/denies                             [facility] Adult PT Treatment/Exercise - 12/01/19 0001      High Level Balance   High Level Balance Activities Tandem walking;Side stepping    High Level Balance Comments tandem and sidestepping on foam balance beam in parallel bars      Knee/Hip Exercises: Aerobic   Recumbent Bike L2-L3 x 6 min    Nustep L5 x 6 min      Knee/Hip Exercises: Machines for Strengthening   Cybex Knee Extension 15# 2x15 BLE    Cybex Knee Flexion 35# 2x15 BLE    Cybex Leg Press 40# 2x15 BLE; calf  raises BLE 2x15      Knee/Hip Exercises: Standing   Lateral Step Up Both;1 set;10 reps;Hand Hold: 0;Step Height: 6"    Forward Step Up Both;1 set;10 reps;Hand Hold: 0;Step Height: 6"      Moist Heat Therapy   Number Minutes Moist Heat 10 Minutes    Moist Heat Location Hip                    PT Short Term Goals - 11/17/19 1114      PT SHORT TERM GOAL #1   Title Pt will be I and compliant with initial HEP.    Time 2    Status Achieved             PT Long Term Goals - 11/11/19 1425      PT LONG TERM GOAL #1   Title Pt will be I and compliant with long term HEP for maintenance and injury prevention.    Time 6    Period Weeks    Status New    Target Date 12/23/19  PT LONG TERM GOAL #2   Title Pt will increase R hip abductor/extensor MMT to at least 4/5 to support normalized gait pattern and stairs negotiation.    Time 6    Period Weeks    Status New    Target Date 12/23/19      PT LONG TERM GOAL #3   Title Pt will walk >2 miles without any hip pain for increased leisure activity and exercise.    Baseline 1 mi    Time 6    Status New    Target Date 12/23/19      PT LONG TERM GOAL #4   Title Pt will negotiate 1 flight of stairs with reciprocal pattern and no pain for home access.    Time 6    Period Weeks    Status New    Target Date 12/23/19      PT LONG TERM GOAL #5   Title Pt will report no pain with ADLs/IADLs for normal life participation.    Time 6    Period Weeks    Status New    Target Date 12/23/19                 Plan - 12/01/19 1014    Clinical Impression Statement Pt with improved tolerance to exercise this rx. Did well with machine level interventions with cues for full ROM with knee flexion/extension machine. Some ankle instability noted with tandem walking on foam balance beam; had to rely on UEs for support with this activity. Improved with static tandem stance on foam balance beam.    PT Treatment/Interventions ADLs/Self  Care Home Management;Electrical Stimulation;Vasopneumatic Device;Cryotherapy;Moist Heat;Iontophoresis 4mg /ml Dexamethasone;Gait training;Stair training;Functional mobility training;Therapeutic activities;Neuromuscular re-education;Balance training;Therapeutic exercise;Patient/family education;Passive range of motion;Manual techniques;Dry needling    PT Next Visit Plan progress hip/LE flexibility and proximal core/glute strength, gait/stairs training    Consulted and Agree with Plan of Care Patient           Patient will benefit from skilled therapeutic intervention in order to improve the following deficits and impairments:  Abnormal gait, Decreased balance, Difficulty walking, Hypomobility, Improper body mechanics, Impaired perceived functional ability, Decreased range of motion, Decreased strength, Increased fascial restricitons, Impaired flexibility, Postural dysfunction, Pain  Visit Diagnosis: It band syndrome, right  Pain in right leg  Abnormal posture  Other abnormalities of gait and mobility  Muscle weakness (generalized)     Problem List Patient Active Problem List   Diagnosis Date Noted   Acute, but ill-defined, cerebrovascular disease 12/27/2012   Occlusion and stenosis of carotid artery 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 cerebral infarction 12/05/2012   Aortic valve disorder 12/05/2012   Cancer ([place])    GERD (gastroesophageal reflux disease)    Hypoglycemia    Depressive disorder    Otitis    Amador Cunas, PT, DPT [facility]se Yvonda Fouty 12/01/2019, 10:20 AM  [place]. [place], [place], [address] Phone: [phone]   Fax:  [phone]  Name: TAVIOUS GRIESINGER MRN: [id number] Date of Birth: 1945/06/10

## 2019-12-02 ENCOUNTER — Encounter: Payer: Self-pay | Admitting: Physical Therapy

## 2019-12-03 ENCOUNTER — Encounter: Payer: Self-pay | Admitting: Physical Therapy

## 2019-12-03 ENCOUNTER — Ambulatory Visit: Payer: PPO | Admitting: Physical Therapy

## 2019-12-03 ENCOUNTER — Other Ambulatory Visit: Payer: Self-pay

## 2019-12-03 DIAGNOSIS — M79604 Pain in right leg: Secondary | ICD-10-CM

## 2019-12-03 DIAGNOSIS — M7631 Iliotibial band syndrome, right leg: Secondary | ICD-10-CM

## 2019-12-03 DIAGNOSIS — M6281 Muscle weakness (generalized): Secondary | ICD-10-CM

## 2019-12-03 DIAGNOSIS — R2689 Other abnormalities of gait and mobility: Secondary | ICD-10-CM

## 2019-12-03 DIAGNOSIS — R293 Abnormal posture: Secondary | ICD-10-CM

## 2019-12-03 NOTE — Therapy (Signed)
[place]. [place], [place], [address] Phone: [phone]   Fax:  [phone]  Physical Therapy Treatment  Patient Details  Name: Logan Wolfe MRN: [id number] Date of Birth: [birth year] Referring Provider (PT): Lawerance Cruel, MD   Encounter Date: 12/03/2019   PT End of Session - 12/03/19 1008    Visit Number 7    Number of Visits 12    Date for PT Re-Evaluation 12/23/19    PT Start Time 0925    PT Stop Time 1008    PT Time Calculation (min) 43 min    Activity Tolerance Patient tolerated treatment well    Behavior During Therapy [facility] for tasks assessed/performed           Past Medical History:  Diagnosis Date  . Allergic rhinitis, cause unspecified   . Anxiety   . Aortic valve calcification   . Cancer [facility])    Prostate CA, Dr Jeffie Pollock 22 years ago  . Constipation   . Depressive disorder   . Dyspepsia and other specified disorders of function of stomach   . GERD (gastroesophageal reflux disease)   . Hypoglycemia   . Memory loss   . Otitis    externa of right ear  . Spinal stenosis   . Unspecified pruritic disorder     Past Surgical History:  Procedure Laterality Date  . PROSTATE SURGERY      There were no vitals filed for this visit.   Subjective Assessment - 12/03/19 0933    Subjective Pt reports some LBP from sleeping wrong last night; leg is stil sore but better overall    Currently in Pain? No/denies    Pain Score 0-No pain                             [facility] Adult PT Treatment/Exercise - 12/03/19 0001      High Level Balance   High Level Balance Activities Side stepping    High Level Balance Comments sidestepping on foam balance beam; tandem stance 2x10 sec on foam balance beam      Knee/Hip Exercises: Aerobic   Recumbent Bike L2-L3 x 6 min    Nustep L5 x 6 min      Knee/Hip Exercises: Machines for Strengthening   Cybex Knee Extension 15# 2x15 BLE    Cybex  Knee Flexion 35# 2x15 BLE    Cybex Leg Press 40# 2x15 BLE; calf raises BLE 2x15      Knee/Hip Exercises: Standing   Lateral Step Up Both;1 set;10 reps;Hand Hold: 0;Step Height: 6"    Forward Step Up Both;1 set;10 reps;Hand Hold: 0;Step Height: 6"    Other Standing Knee Exercises resisted gait 30# x4 each direction                    PT Short Term Goals - 11/17/19 1114      PT SHORT TERM GOAL #1   Title Pt will be I and compliant with initial HEP.    Time 2    Status Achieved             PT Long Term Goals - 12/03/19 1009      PT LONG TERM GOAL #1   Title Pt will be I and compliant with long term HEP for maintenance and injury prevention.    Time 6    Period Weeks    Status On-going  PT LONG TERM GOAL #2   Title Pt will increase R hip abductor/extensor MMT to at least 4/5 to support normalized gait pattern and stairs negotiation.    Time 6    Period Weeks    Status New      PT LONG TERM GOAL #3   Title Pt will walk >2 miles without any hip pain for increased leisure activity and exercise.    Baseline 1 mi    Time 6    Status On-going      PT LONG TERM GOAL #4   Title Pt will negotiate 1 flight of stairs with reciprocal pattern and no pain for home access.    Time 6    Period Weeks    Status On-going      PT LONG TERM GOAL #5   Title Pt will report no pain with ADLs/IADLs for normal life participation.    Time 6    Period Weeks    Status On-going                 Plan - 12/03/19 1008    Clinical Impression Statement Pt demos increased tolerance for standing ex's this rx. Supervision assist with resisted gait with no LOB in any direction. Some difficulty with backwards stepping and cues for longer step length. Pt demos improved stability with foam balance beam; ankle instability noted with tandem stance.    PT Treatment/Interventions ADLs/Self Care Home Management;Electrical Stimulation;Vasopneumatic Device;Cryotherapy;Moist  Heat;Iontophoresis 4mg /ml Dexamethasone;Gait training;Stair training;Functional mobility training;Therapeutic activities;Neuromuscular re-education;Balance training;Therapeutic exercise;Patient/family education;Passive range of motion;Manual techniques;Dry needling    PT Next Visit Plan progress hip/LE flexibility and proximal core/glute strength, gait/stairs training    Consulted and Agree with Plan of Care Patient           Patient will benefit from skilled therapeutic intervention in order to improve the following deficits and impairments:  Abnormal gait, Decreased balance, Difficulty walking, Hypomobility, Improper body mechanics, Impaired perceived functional ability, Decreased range of motion, Decreased strength, Increased fascial restricitons, Impaired flexibility, Postural dysfunction, Pain  Visit Diagnosis: It band syndrome, right  Pain in right leg  Abnormal posture  Other abnormalities of gait and mobility  Muscle weakness (generalized)     Problem List Patient Active Problem List   Diagnosis Date Noted  . Acute, but ill-defined, cerebrovascular disease 12/27/2012  . 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 Aortic valve disorder 12/05/2012  . Cancer ([place])   . GERD (gastroesophageal reflux disease)   . Hypoglycemia   . Depressive disorder   . Otitis    Amador Cunas, PT, DPT [facility] Jeshawn Melucci 12/03/2019, 10:10 AM  [place] [address] [place], [place], [address] Phone: [phone]   Fax:  [phone]  Name: Logan Wolfe MRN: [id number] Date of Birth: [birth year]

## 2019-12-04 ENCOUNTER — Encounter: Payer: Self-pay | Admitting: Physical Therapy

## 2019-12-08 ENCOUNTER — Ambulatory Visit: Payer: PPO | Admitting: Physical Therapy

## 2019-12-08 ENCOUNTER — Other Ambulatory Visit: Payer: Self-pay

## 2019-12-08 ENCOUNTER — Encounter: Payer: Self-pay | Admitting: Physical Therapy

## 2019-12-08 DIAGNOSIS — M79604 Pain in right leg: Secondary | ICD-10-CM

## 2019-12-08 DIAGNOSIS — M6281 Muscle weakness (generalized): Secondary | ICD-10-CM

## 2019-12-08 DIAGNOSIS — R2689 Other abnormalities of gait and mobility: Secondary | ICD-10-CM

## 2019-12-08 DIAGNOSIS — R293 Abnormal posture: Secondary | ICD-10-CM

## 2019-12-08 DIAGNOSIS — M7631 Iliotibial band syndrome, right leg: Secondary | ICD-10-CM | POA: Diagnosis not present

## 2019-12-08 NOTE — Therapy (Signed)
[place] [place], [place], [address] Phone: [phone]   Fax:  [phone]  Physical Therapy Treatment  Patient Details  Name: Logan Wolfe MRN: [id number] Date of Birth: 02/14/1946 Referring Provider (PT): Lawerance Cruel, MD   Encounter Date: 12/08/2019   PT End of Session - 12/08/19 1011    Visit Number 8    Number of Visits 12    Date for PT Re-Evaluation 12/23/19    PT Start Time 0922    PT Stop Time 1007    PT Time Calculation (min) 45 min    Activity Tolerance Patient tolerated treatment well    Behavior During Therapy [facility] for tasks assessed/performed           Past Medical History:  Diagnosis Date  . Allergic rhinitis, cause unspecified   . Anxiety   . Aortic valve calcification   . Cancer [facility])    Prostate CA, Dr Jeffie Pollock 22 years ago  . Constipation   . Depressive disorder   . Dyspepsia and other specified disorders of function of stomach   . GERD (gastroesophageal reflux disease)   . Hypoglycemia   . Memory loss   . Otitis    externa of right ear  . Spinal stenosis   . Unspecified pruritic disorder     Past Surgical History:  Procedure Laterality Date  . PROSTATE SURGERY      There were no vitals filed for this visit.   Subjective Assessment - 12/08/19 0923    Subjective Pt reports feeling very sore over the weekend; he is unsure why. Discussed wanting to potentially d/c from PT with updated HEP    Currently in Pain? No/denies                             [facility] Adult PT Treatment/Exercise - 12/08/19 0001      Knee/Hip Exercises: Aerobic   Recumbent Bike L2-L3 x 6 min    Nustep L5 x 6 min      Knee/Hip Exercises: Machines for Strengthening   Cybex Knee Extension 15# 2x15 BLE    Cybex Knee Flexion 35# 2x15 BLE    Cybex Leg Press 40# 2x15 BLE; calf raises BLE 2x15      Knee/Hip Exercises: Standing   Heel Raises Both;1 set;15 reps    Hip Abduction  Both;10 reps;1 set    Hip Extension Both;1 set;10 reps    Other Standing Knee Exercises resisted gait 30# x4 each direction      Knee/Hip Exercises: Seated   Sit to Sand 20 reps;without UE support   yellow ball chest press                   PT Short Term Goals - 11/17/19 1114      PT SHORT TERM GOAL #1   Title Pt will be I and compliant with initial HEP.    Time 2    Status Achieved             PT Long Term Goals - 12/03/19 1009      PT LONG TERM GOAL #1   Title Pt will be I and compliant with long term HEP for maintenance and injury prevention.    Time 6    Period Weeks    Status On-going      PT LONG TERM GOAL #2   Title Pt will increase R hip  abductor/extensor MMT to at least 4/5 to support normalized gait pattern and stairs negotiation.    Time 6    Period Weeks    Status New      PT LONG TERM GOAL #3   Title Pt will walk >2 miles without any hip pain for increased leisure activity and exercise.    Baseline 1 mi    Time 6    Status On-going      PT LONG TERM GOAL #4   Title Pt will negotiate 1 flight of stairs with reciprocal pattern and no pain for home access.    Time 6    Period Weeks    Status On-going      PT LONG TERM GOAL #5   Title Pt will report no pain with ADLs/IADLs for normal life participation.    Time 6    Period Weeks    Status On-going                 Plan - 12/08/19 1011    Clinical Impression Statement Pt demos improved balance with resisted gait this rx. Did well with machine interventions and STS. Difficulty with standing hip ex's with pain in L hip. Discussed updating HEP next rx; pt would like to cut back or d/c from PT with increased focus on exercising at home.    PT Treatment/Interventions ADLs/Self Care Home Management;Electrical Stimulation;Vasopneumatic Device;Cryotherapy;Moist Heat;Iontophoresis 4mg /ml Dexamethasone;Gait training;Stair training;Functional mobility training;Therapeutic activities;Neuromuscular  re-education;Balance training;Therapeutic exercise;Patient/family education;Passive range of motion;Manual techniques;Dry needling    PT Next Visit Plan progress hip/LE flexibility and proximal core/glute strength, gait/stairs training    Consulted and Agree with Plan of Care Patient           Patient will benefit from skilled therapeutic intervention in order to improve the following deficits and impairments:  Abnormal gait, Decreased balance, Difficulty walking, Hypomobility, Improper body mechanics, Impaired perceived functional ability, Decreased range of motion, Decreased strength, Increased fascial restricitons, Impaired flexibility, Postural dysfunction, Pain  Visit Diagnosis: It band syndrome, right  Pain in right leg  Abnormal posture  Other abnormalities of gait and mobility  Muscle weakness (generalized)     Problem List Patient Active Problem List   Diagnosis Date Noted  . Acute, but ill-defined, cerebrovascular disease 12/27/2012  . 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 Aortic valve disorder 12/05/2012  . Cancer ([place])   . GERD (gastroesophageal reflux disease)   . Hypoglycemia   . Depressive disorder   . Otitis    Amador Cunas, PT, DPT Donald Prose Vencent Hauschild 12/08/2019, 10:13 AM  [place] [place], [place], [address] Phone: [phone]   Fax:  [phone]  Name: Logan Wolfe MRN: [id number] Date of Birth: 1945-04-13

## 2019-12-10 ENCOUNTER — Other Ambulatory Visit: Payer: Self-pay

## 2019-12-10 ENCOUNTER — Ambulatory Visit: Payer: PPO | Admitting: Physical Therapy

## 2019-12-10 ENCOUNTER — Encounter: Payer: Self-pay | Admitting: Physical Therapy

## 2019-12-10 DIAGNOSIS — M7631 Iliotibial band syndrome, right leg: Secondary | ICD-10-CM

## 2019-12-10 DIAGNOSIS — M79604 Pain in right leg: Secondary | ICD-10-CM

## 2019-12-10 DIAGNOSIS — R293 Abnormal posture: Secondary | ICD-10-CM

## 2019-12-10 DIAGNOSIS — R2689 Other abnormalities of gait and mobility: Secondary | ICD-10-CM

## 2019-12-10 NOTE — Therapy (Signed)
[address]an Wolfe. [place], [place], [address] Phone: [phone]   Fax:  [phone]  Physical Therapy Treatment  Patient Details  Name: Logan Wolfe MRN: [id number] Date of Birth: 1945-07-20 Referring Provider (PT): Logan Cruel, MD   Encounter Date: 12/10/2019   PT End of Session - 12/10/19 1020    Visit Number 9    Date for PT Re-Evaluation 12/23/19    PT Start Time 0930    PT Stop Time 1013    PT Time Calculation (min) 43 min    Activity Tolerance Patient tolerated treatment well    Behavior During Therapy [facility] for tasks assessed/performed           Past Medical History:  Diagnosis Date  . Allergic rhinitis, cause unspecified   . Anxiety   . Aortic valve calcification   . Cancer [facility])    Prostate CA, Dr Logan Wolfe 22 years ago  . Constipation   . Depressive disorder   . Dyspepsia and other specified disorders of function of stomach   . GERD (gastroesophageal reflux disease)   . Hypoglycemia   . Memory loss   . Otitis    externa of right ear  . Spinal stenosis   . Unspecified pruritic disorder     Past Surgical History:  Procedure Laterality Date  . PROSTATE SURGERY      There were no vitals filed for this visit.   Subjective Assessment - 12/10/19 0940    Subjective Pt reports feeling pretty good today. Irritated [place].    Currently in Pain? No/denies                             [facility] Adult PT Treatment/Exercise - 12/10/19 0001      High Level Balance   High Level Balance Comments sidestepping on foam balance beam; tandem stance 2x10 sec on foam balance beam      Knee/Hip Exercises: Stretches   Gastroc Stretch Both;1 rep;30 seconds      Knee/Hip Exercises: Aerobic   Recumbent Bike L2-L3 x 6 min    Nustep L5 x 6 min      Knee/Hip Exercises: Standing   Heel Raises Both;1 set;15 reps    Hip Abduction Both;10 reps;1 set    Abduction  Limitations red TB    Hip Extension Both;1 set;10 reps    Extension Limitations red TB    Lateral Step Up Both;10 reps;Hand Hold: 0;Step Height: 6";2 sets    Forward Step Up Both;10 reps;Step Height: 6";Hand Hold: 2;2 sets                    PT Short Term Goals - 11/17/19 1114      PT SHORT TERM GOAL #1   Title Pt will be I and compliant with initial HEP.    Time 2    Status Achieved             PT Long Term Goals - 12/10/19 1001      PT LONG TERM GOAL #1   Title Pt will be I and compliant with long term HEP for maintenance and injury prevention.    Time 6    Period Weeks    Status Achieved      PT LONG TERM GOAL #2   Title Pt will increase R hip abductor/extensor MMT to at least 4/5 to support normalized  gait pattern and stairs negotiation.    Baseline 4-/5    Time 6    Period Weeks    Status Partially Met      PT LONG TERM GOAL #3   Title Pt will walk >2 miles without any hip pain for increased leisure activity and exercise.    Baseline 1 mi    Time 6    Status Partially Met      PT LONG TERM GOAL #4   Title Pt will negotiate 1 flight of stairs with reciprocal pattern and no pain for home access.    Time 6    Period Weeks    Status Partially Met      PT LONG TERM GOAL #5   Title Pt will report no pain with ADLs/IADLs for normal life participation.    Time 6    Period Weeks    Status Partially Met                 Plan - 12/10/19 1021    Clinical Impression Statement Discussed putting pt on hold today; prescribed updated HEP with more standing hip ex's. Pt demos understanding of HEP. Pt expressed desire to try doing exercises at home; educated on when to call back if symptoms do not improve or worsen with pt VU.    PT Treatment/Interventions ADLs/Self Care Home Management;Electrical Stimulation;Vasopneumatic Device;Cryotherapy;Moist Heat;Iontophoresis 56m/ml Dexamethasone;Gait training;Stair training;Functional mobility training;Therapeutic  activities;Neuromuscular re-education;Balance training;Therapeutic exercise;Patient/family education;Passive range of motion;Manual techniques;Dry needling    PT Next Visit Plan progress hip/LE flexibility and proximal core/glute strength, gait/stairs training    Consulted and Agree with Plan of Care Patient           Patient will benefit from skilled therapeutic intervention in order to improve the following deficits and impairments:  Abnormal gait, Decreased balance, Difficulty walking, Hypomobility, Improper body mechanics, Impaired perceived functional ability, Decreased range of motion, Decreased strength, Increased fascial restricitons, Impaired flexibility, Postural dysfunction, Pain  Visit Diagnosis: It band syndrome, right  Pain in right leg  Abnormal posture  Other abnormalities of gait and mobility     Problem List Patient Active Problem List   Diagnosis Date Noted  . Acute, but ill-defined, cerebrovascular disease 12/27/2012  . 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 Aortic valve disorder 12/05/2012  . Cancer ([place]   . GERD (gastroesophageal reflux disease)   . Hypoglycemia   . Depressive disorder   . Otitis    AAmador Cunas PT, DPT ADonald ProseSugg 12/10/2019, 10:27 AM  [place] [address]Phone: [phone]  Fax:  [phone] Name: Logan MCADAMMRN: [id number] of Birth: [birth year]

## 2020-01-14 DIAGNOSIS — C61 Malignant neoplasm of prostate: Secondary | ICD-10-CM | POA: Diagnosis not present

## 2020-01-21 DIAGNOSIS — C61 Malignant neoplasm of prostate: Secondary | ICD-10-CM | POA: Diagnosis not present

## 2020-01-21 DIAGNOSIS — N481 Balanitis: Secondary | ICD-10-CM | POA: Diagnosis not present

## 2020-01-21 DIAGNOSIS — N3946 Mixed incontinence: Secondary | ICD-10-CM | POA: Diagnosis not present

## 2020-01-23 DIAGNOSIS — Z1211 Encounter for screening for malignant neoplasm of colon: Secondary | ICD-10-CM | POA: Diagnosis not present

## 2020-01-23 DIAGNOSIS — D649 Anemia, unspecified: Secondary | ICD-10-CM | POA: Diagnosis not present

## 2020-01-23 DIAGNOSIS — R197 Diarrhea, unspecified: Secondary | ICD-10-CM | POA: Diagnosis not present

## 2020-01-23 DIAGNOSIS — C61 Malignant neoplasm of prostate: Secondary | ICD-10-CM | POA: Diagnosis not present

## 2020-01-30 ENCOUNTER — Other Ambulatory Visit (HOSPITAL_COMMUNITY): Payer: Self-pay | Admitting: Urology

## 2020-01-30 DIAGNOSIS — C61 Malignant neoplasm of prostate: Secondary | ICD-10-CM

## 2020-02-12 ENCOUNTER — Other Ambulatory Visit: Payer: Self-pay

## 2020-02-12 ENCOUNTER — Ambulatory Visit (HOSPITAL_COMMUNITY)
Admission: RE | Admit: 2020-02-12 | Discharge: 2020-02-12 | Disposition: A | Discharge status: Home / Self Care | Payer: PPO | Source: Ambulatory Visit | Attending: Urology | Admitting: Urology

## 2020-02-12 DIAGNOSIS — C61 Malignant neoplasm of prostate: Secondary | ICD-10-CM | POA: Diagnosis not present

## 2020-02-12 DIAGNOSIS — I700 Atherosclerosis of aorta: Secondary | ICD-10-CM | POA: Diagnosis not present

## 2020-02-12 DIAGNOSIS — K7689 Other specified diseases of liver: Secondary | ICD-10-CM | POA: Diagnosis not present

## 2020-02-12 DIAGNOSIS — I2510 Atherosclerotic heart disease of native coronary artery without angina pectoris: Secondary | ICD-10-CM | POA: Diagnosis not present

## 2020-02-12 MED ORDER — PIFLIFOLASTAT F 18 (PYLARIFY) INJECTION
9.0000 | Freq: Once | INTRAVENOUS | Status: AC
  Administered 2020-02-12: 13:00:00 9.5 via INTRAVENOUS

## 2020-03-03 DIAGNOSIS — C61 Malignant neoplasm of prostate: Secondary | ICD-10-CM | POA: Diagnosis not present

## 2020-03-03 DIAGNOSIS — C775 Secondary and unspecified malignant neoplasm of intrapelvic lymph nodes: Secondary | ICD-10-CM | POA: Diagnosis not present

## 2020-03-22 ENCOUNTER — Encounter: Payer: Self-pay | Admitting: Radiation Oncology

## 2020-03-22 DIAGNOSIS — G4700 Insomnia, unspecified: Secondary | ICD-10-CM | POA: Insufficient documentation

## 2020-03-22 DIAGNOSIS — Z860100 Personal history of colon polyps, unspecified: Secondary | ICD-10-CM | POA: Insufficient documentation

## 2020-03-22 DIAGNOSIS — K52832 Lymphocytic colitis: Secondary | ICD-10-CM | POA: Insufficient documentation

## 2020-03-22 DIAGNOSIS — R1310 Dysphagia, unspecified: Secondary | ICD-10-CM | POA: Insufficient documentation

## 2020-03-22 DIAGNOSIS — C61 Malignant neoplasm of prostate: Secondary | ICD-10-CM | POA: Insufficient documentation

## 2020-03-22 DIAGNOSIS — R0609 Other forms of dyspnea: Secondary | ICD-10-CM | POA: Insufficient documentation

## 2020-03-22 DIAGNOSIS — E7800 Pure hypercholesterolemia, unspecified: Secondary | ICD-10-CM | POA: Insufficient documentation

## 2020-03-22 DIAGNOSIS — M4800 Spinal stenosis, site unspecified: Secondary | ICD-10-CM | POA: Insufficient documentation

## 2020-03-22 DIAGNOSIS — Z131 Encounter for screening for diabetes mellitus: Secondary | ICD-10-CM | POA: Insufficient documentation

## 2020-03-22 DIAGNOSIS — R0600 Dyspnea, unspecified: Secondary | ICD-10-CM | POA: Insufficient documentation

## 2020-03-22 DIAGNOSIS — E785 Hyperlipidemia, unspecified: Secondary | ICD-10-CM | POA: Insufficient documentation

## 2020-03-22 NOTE — Progress Notes (Signed)
GU Location of Tumor / Histology: Recurrent prostatic adenocarcinoma  If Prostate Cancer, Gleason Score is (3 + 3). Current PSA 17.8 up from 13.2.  Logan Wolfe presented for follow up care and a rising PSA was noted. Patient had a prostatectomy in 1991. Patient treated with salvage RT approximately one year s/p prostatectomy. PET done 02/12/20 revealed recurrence in the prostate bed and nodes. Referred to Dr. Tammi Klippel for consideration of repeat salvage xrt.     Past/Anticipated interventions by urology, if any: prostatectomy, active surveillance, PET, referral to Dr. Tammi Klippel  Past/Anticipated interventions by medical oncology, if any: no  Weight changes, if any: denies  Bowel/Bladder complaints, if any: Reports occasional urinary urgency (at night). Reports nocturia x 3. Reports urinary incontinence. Denies dysuria or hematuria. IPSS 9. SHIM 5. Denies any bowel complaints.  Nausea/Vomiting, if any: denies  Pain issues, if any:  denies  SAFETY ISSUES:  Prior radiation? Yes in 1992  Pacemaker/ICD? denies  Possible current pregnancy? no, male patient  Is the patient on methotrexate? denies  Current Complaints / other details:  75 year old male. Married with two children. Resides in [place], [place].

## 2020-03-23 ENCOUNTER — Encounter: Payer: Self-pay | Admitting: Radiation Oncology

## 2020-03-23 ENCOUNTER — Ambulatory Visit
Admission: RE | Admit: 2020-03-23 | Discharge: 2020-03-23 | Disposition: A | Discharge status: Home / Self Care | Payer: PPO | Source: Ambulatory Visit | Attending: Radiation Oncology | Admitting: Radiation Oncology

## 2020-03-23 ENCOUNTER — Other Ambulatory Visit: Payer: Self-pay

## 2020-03-23 ENCOUNTER — Encounter: Payer: Self-pay | Admitting: Medical Oncology

## 2020-03-23 DIAGNOSIS — Z8042 Family history of malignant neoplasm of prostate: Secondary | ICD-10-CM | POA: Insufficient documentation

## 2020-03-23 DIAGNOSIS — F418 Other specified anxiety disorders: Secondary | ICD-10-CM | POA: Diagnosis not present

## 2020-03-23 DIAGNOSIS — Z923 Personal history of irradiation: Secondary | ICD-10-CM | POA: Insufficient documentation

## 2020-03-23 DIAGNOSIS — F17210 Nicotine dependence, cigarettes, uncomplicated: Secondary | ICD-10-CM | POA: Insufficient documentation

## 2020-03-23 DIAGNOSIS — K219 Gastro-esophageal reflux disease without esophagitis: Secondary | ICD-10-CM | POA: Diagnosis not present

## 2020-03-23 DIAGNOSIS — Z7982 Long term (current) use of aspirin: Secondary | ICD-10-CM | POA: Diagnosis not present

## 2020-03-23 DIAGNOSIS — C61 Malignant neoplasm of prostate: Secondary | ICD-10-CM | POA: Insufficient documentation

## 2020-03-23 DIAGNOSIS — C775 Secondary and unspecified malignant neoplasm of intrapelvic lymph nodes: Secondary | ICD-10-CM | POA: Diagnosis not present

## 2020-03-23 DIAGNOSIS — K5900 Constipation, unspecified: Secondary | ICD-10-CM | POA: Insufficient documentation

## 2020-03-23 HISTORY — DX: Malignant neoplasm of prostate: C61

## 2020-03-23 NOTE — Progress Notes (Signed)
Introduced myself to patient as the prostate nurse navigator and discussed my role. He underwent prostatectomy in 1991 followed by salvage radiation. In late 2021, PSA levels increased and had a PSMA PET that revealed recurrence. He is here to discuss his radiation treatment options. I gave him my business card and asked him to call me with questions or concerns. He voice understanding.

## 2020-03-23 NOTE — Progress Notes (Signed)
Radiation Oncology         [phone] ________________________________  Initial outpatient Consultation  Name: Logan Wolfe MRN: [id number]  Date: 03/23/2020  DOB: 07-Jan-1946  YS:AYTK, Dwyane Luo, MD  Irine Seal, MD   REFERRING PHYSICIAN: Irine Seal, MD  DIAGNOSIS: 75 y.o. 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    ICD-10-CM   1. Malignant tumor of prostate ([place])  C61   2. 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 He was initially diagnosed with Gleason 3+3 prostate cancer back in 07/1989 by Dr. Jeffie Pollock. Given his young age of 75 years old at the time of diagnosis, he opted to proceed with prostatectomy on 09/10/1989, with pathology confirming Gleason 3+3 prostate cancer and benign lymph nodes (0/8). He reportedly had salvage radiation therapy in 1992 and also received a year of Lupron as part of a research study with the last dose given in 09/2008.  He has continued with close monitoring of the PSA under the care of Dr. Jeffie Pollock. His PSA has slowly risen over the years, but his restaging scans had been negative. His PSA rose to 11.5 in 12/2016, prompting an [place] PET scan in 01/2017 that was negative. More recently, the PSA reached 21.3, and he underwent staging CT and bone scan. Again, both scans were negative for any definite metastatic disease or recurrence, and a subsequent PSA dropped to 13.2 in 10/2019. By 01/2020, the PSA was back up to 17.8, so he proceeded with PSMA scan for further evaluation.  This exam was performed on 02/12/2020 and revealed new tracer uptake within the prostate bed as well as subcentimeter hypermetabolic pelvic lymph nodes in the left  posterior pelvis as well as right and left external iliac nodes. There was no evidence of solid organ or osseous metastatic disease.  The patient reviewed the biopsy results with his urologist and he has kindly been referred today for discussion of potential radiation treatment options.   PREVIOUS RADIATION THERAPY: Yes  1992: Salvage prostate fossa radiation  PAST MEDICAL HISTORY:  Past Medical History:  Diagnosis Date  . Allergic rhinitis, cause unspecified   . Anxiety   . Aortic valve calcification   . Cancer [facility])    Prostate CA, Dr Jeffie Pollock 22 years ago  . Constipation   . Depressive disorder   . Dyspepsia and other specified disorders of function of stomach   . GERD (gastroesophageal reflux disease)   . Hypoglycemia   . Memory loss   . Otitis    externa of right ear  . Prostate cancer ([place])   . Spinal stenosis   . Unspecified pruritic disorder       PAST SURGICAL HISTORY: Past Surgical History:  Procedure Laterality Date  . PROSTATE SURGERY      FAMILY HISTORY:  Family History  Problem Relation Age of Onset  . Hyperlipidemia Mother   . Diabetes Mother   . Skin cancer Father        part of right ear removed  . Prostate cancer Brother   . Breast cancer Neg Hx   . Colon cancer Neg Hx   . Pancreatic cancer Neg Hx     SOCIAL  HISTORY:  Social History   Socioeconomic History  . Marital status: Married    Spouse name: Logan Wolfe  . Number of children: 2  . Years of education: 37  . Highest education level: Not on file  Occupational History    Employer: RETIRED  Tobacco Use  . Smoking status: Light Tobacco Smoker    Years: 15.00    Types: Cigars  . Smokeless tobacco: Never Used  Vaping Use  . Vaping Use: Never used  Substance and Sexual Activity  . Alcohol use: Yes  . Drug use: No  . Sexual activity: Not Currently    Partners: Female  Other Topics Concern  . Not on file  Social History Narrative   Patient is married Logan Wolfe) and lives with his wife.    Patient has 2 children and his wife has 2 children.   Patient is semi-retired.   Patient has a college education.   Patient is right-handed.   Patient drinks one cup of coffee daily.   Social Determinants of Health   Financial Resource Strain: Not on file  Food Insecurity: Not on file  Transportation Needs: Not on file  Physical Activity: Not on file  Stress: Not on file  Social Connections: Not on file  Intimate Partner Violence: Not on file    ALLERGIES: Neosporin [bacitracin-polymyxin b] and Penicillins  MEDICATIONS:  Current Outpatient Medications  Medication Sig Dispense Refill  . aspirin EC 81 MG tablet Take 1 tablet (81 mg total) by mouth daily.    . diphenhydramine-acetaminophen (TYLENOL PM EXTRA STRENGTH) 25-500 MG TABS tablet 1 tab    . fluticasone (FLONASE) 50 MCG/ACT nasal spray 1 spray in each nostril    . ibuprofen (ADVIL,MOTRIN) 200 MG tablet Take 200 mg by mouth every 6 (six) hours as needed for pain.    . Melatonin 10 MG SUBL 1 tablet at bedtime as needed with food    . nystatin cream (MYCOSTATIN) SMARTSIG:Sparingly Topical 2-3 Times Daily PRN     No current facility-administered medications for this encounter.    REVIEW OF SYSTEMS:  On review of systems, the patient reports that he is doing well overall. He denies any chest pain, shortness of breath, cough, fevers, chills, night sweats, unintended weight changes. He denies any bowel disturbances, and denies abdominal pain, nausea or vomiting. He denies any new musculoskeletal or joint aches or pains. His IPSS was 9, indicating mild urinary symptoms. He reports nocturia x3, occasional nighttime urinary urgency, and urinary incontinence. 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 A complete review of systems is obtained and is otherwise negative.    PHYSICAL EXAM:  Wt Readings from Last 3 Encounters:  03/23/20 224 lb 8 oz (101.8 kg)  07/11/19 215 lb 1.9 oz (97.6 kg)  03/06/17 237 lb  12.8 oz (107.9 kg)   Temp Readings from Last 3 Encounters:  03/23/20 (!) 97 F (36.1 C) (Temporal)  02/19/17 98.2 F (36.8 C) (Oral)   BP Readings from Last 3 Encounters:  03/23/20 130/80  07/11/19 116/68  03/06/17 140/86   Pulse Readings from Last 3 Encounters:  03/23/20 62  07/11/19 67  03/06/17 80   Pain Assessment Pain Score: 0-No pain/10  In general this is a well appearing Caucasian male in no acute distress. He's alert and oriented x4 and appropriate throughout the examination. Cardiopulmonary assessment is negative for acute distress, and he exhibits normal effort.     KPS = 100  100 - Normal; no complaints; no evidence  of disease. 90   - Able to carry on normal activity; minor signs or symptoms of disease. 80   - Normal activity with effort; some signs or symptoms of disease. 21   - Cares for self; unable to carry on normal activity or to do active work. 60   - Requires occasional assistance, but is able to care for most of his personal needs. 50   - Requires considerable assistance and frequent medical care. 17   - Disabled; requires special care and assistance. 33   - Severely disabled; hospital admission is indicated although death not imminent. 44   - Very sick; hospital admission necessary; active supportive treatment necessary. 10   - Moribund; fatal processes progressing rapidly. 0     - Dead  Karnofsky DA, Abelmann [place], Craver LS and Burchenal [facility] [phone]) The use of the nitrogen mustards in the palliative treatment of carcinoma: with particular reference to bronchogenic carcinoma Cancer 1 634-56  LABORATORY DATA:  Lab Results  Component Value Date   WBC 7.1 02/19/2017   HGB 13.5 02/19/2017   HCT 40.0 02/19/2017   MCV 94.3 02/19/2017   PLT 180 02/19/2017   Lab Results  Component Value Date   NA 137 02/19/2017   K 4.2 02/19/2017   CL 106 02/19/2017   CO2 24 02/19/2017   Lab Results  Component Value Date   ALT 32 10/18/2008   AST 33 10/18/2008    ALKPHOS 117 10/18/2008   BILITOT 0.3 10/18/2008     RADIOGRAPHY: No results found.    IMPRESSION/PLAN: 1. 75 y.o. 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  Today, we talked to the patient about the findings and workup thus far. We discussed the natural history of oligometastatic recurrent prostate adenocarcinoma and general treatment, highlighting the role of radiotherapy in the management. We discussed the available radiation techniques, and focused on the details and logistics of delivery.  There is a link to a reference below outlining the clinical outcomes associated with pelvic re-irradiation, showing that it can be well tolerated with good results.  Given the long interval of time since his previous salvage fossa radiotherapy, as well as the significant improvement in technology and delivery of radiotherapy, it is felt to be safe and recommended to proceed with an additional 7-1/2-week course of salvage radiotherapy directed to the prostate fossa and including the pelvic lymph nodes, utilizing smaller targets to limit reirradiation to the bowel and bladder.  We reviewed the anticipated acute and late sequelae associated with re-irradiation in this setting. The patient was encouraged to ask questions that were answered to his satisfaction.  We also discussed the pros and cons of 6 months of ADT in this setting.  The patient received ADT through 2010 and would like to avoid this unless necessary.  Balancing his age and other factors, we elected to proceed with IMRT without ADT.  At the conclusion of our conversation, the patient is interested in moving forward with a repeat course of salvage radiotherapy directed to the prostate fossa and including the pelvic lymph nodes, utilizing smaller targets to limit reirradiation to the bowel and bladder.  He appears to have a good  understanding of his disease and our treatment recommendations which are of curative intent.  He is in agreement with the stated plan so we will share our discussion with Dr. Jeffie Pollock and move forward with coordinating CT simulation/treatment  planning in anticipation of beginning his daily radiation treatments in the near future.  He is tentatively scheduled for CT simulation on Friday, 03/26/2020 at 10:30 AM.  He knows that he is welcome to call at anytime with any questions or concerns in the interim.  We enjoyed meeting him today and look forward to continuing to participate in his care.    Nicholos Johns, PA-C    Tyler Pita, MD  [facility] Direct Dial: [phone]  Fax: [phone] [email or web address]  Skype  [email or web address]   This document serves as a record of services personally performed by Tyler Pita, MD and Freeman Caldron, PA-C. It was created on their behalf by Wilburn Mylar, a trained medical scribe. The creation of this record is based on the scribe's personal observations and the provider's statements to them. This document has been checked and approved by the attending provider.    References: [email or web address]

## 2020-03-26 ENCOUNTER — Other Ambulatory Visit: Payer: Self-pay

## 2020-03-26 ENCOUNTER — Ambulatory Visit
Admission: RE | Admit: 2020-03-26 | Discharge: 2020-03-26 | Disposition: A | Discharge status: Home / Self Care | Payer: PPO | Source: Ambulatory Visit | Attending: Radiation Oncology | Admitting: Radiation Oncology

## 2020-03-26 DIAGNOSIS — Z510 Encounter for antineoplastic radiation therapy: Secondary | ICD-10-CM | POA: Insufficient documentation

## 2020-03-26 DIAGNOSIS — C61 Malignant neoplasm of prostate: Secondary | ICD-10-CM | POA: Insufficient documentation

## 2020-03-26 DIAGNOSIS — C775 Secondary and unspecified malignant neoplasm of intrapelvic lymph nodes: Secondary | ICD-10-CM | POA: Diagnosis not present

## 2020-03-26 NOTE — Progress Notes (Signed)
  Radiation Oncology         [phone] ________________________________  Name: MARCIANO MUNDT MRN: [id number]  Date: 03/26/2020  DOB: December 26, 1945  SIMULATION AND TREATMENT PLANNING NOTE    ICD-10-CM   1. 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[place])  C61   2. 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  NARRATIVE:  The patient was brought to the [facility].  Identity was confirmed.  All relevant records and images related to the planned course of therapy were reviewed.  The patient freely provided informed written consent to proceed with treatment after reviewing the details related to the planned course of therapy. The consent form was witnessed and verified by the simulation staff.  Then, the patient was set-up in a stable reproducible supine position for radiation therapy.  A vacuum lock pillow device was custom fabricated to position his legs in a reproducible immobilized position.  Then, I performed a urethrogram under sterile conditions to identify the prostatic bed.  CT images were obtained.  Surface markings were placed.  The CT images were loaded into the planning software and fused with his recent PSMA-PET for target delineation.  Then the prostate bed target, pelvic lymph node target and avoidance structures including the rectum, bladder, bowel and hips were contoured.  Treatment planning then occurred.  The radiation prescription was entered and confirmed.  A total of one complex treatment devices were fabricated. I have requested : Intensity Modulated Radiotherapy (IMRT) is medically necessary for this case for the following reason:  Rectal sparing.  SPECIAL TREATMENT PROCEDURE:  The planned course of therapy using radiation constitutes a special  treatment procedure. Special care is required in the management of this patient for the following reasons. This treatment constitutes a Special Treatment Procedure for the following reason: [ Retreatment in a previously radiated area requiring careful monitoring of increased risk of toxicity due to overlap of previous treatment..  The special nature of the planned course of radiotherapy will require increased physician supervision and oversight to ensure patient's safety with optimal treatment outcomes.  PLAN:  The patient will receive 45 Gy in 25 fractions of 1.8 Gy, followed by a boost to the prostate bed and positive nodes to a total dose of 71 Gy with 13 additional fractions of 2 Gy.   ________________________________  Sheral Apley Tammi Klippel, M.D.  This document serves as a record of services personally performed by Tyler Pita, MD. It was created on his behalf by Wilburn Mylar, a trained medical scribe. The creation of this record is based on the scribe's personal observations and the provider's statements to them. This document has been checked and approved by the attending provider.

## 2020-04-02 DIAGNOSIS — Z510 Encounter for antineoplastic radiation therapy: Secondary | ICD-10-CM | POA: Diagnosis not present

## 2020-04-02 DIAGNOSIS — C61 Malignant neoplasm of prostate: Secondary | ICD-10-CM | POA: Diagnosis not present

## 2020-04-07 ENCOUNTER — Ambulatory Visit
Admission: RE | Admit: 2020-04-07 | Discharge: 2020-04-07 | Disposition: A | Discharge status: Home / Self Care | Payer: PPO | Source: Ambulatory Visit | Attending: Radiation Oncology | Admitting: Radiation Oncology

## 2020-04-07 DIAGNOSIS — C61 Malignant neoplasm of prostate: Secondary | ICD-10-CM | POA: Diagnosis not present

## 2020-04-07 DIAGNOSIS — Z510 Encounter for antineoplastic radiation therapy: Secondary | ICD-10-CM | POA: Diagnosis not present

## 2020-04-08 ENCOUNTER — Ambulatory Visit
Admission: RE | Admit: 2020-04-08 | Discharge: 2020-04-08 | Disposition: A | Discharge status: Home / Self Care | Payer: PPO | Source: Ambulatory Visit | Attending: Radiation Oncology | Admitting: Radiation Oncology

## 2020-04-08 DIAGNOSIS — Z510 Encounter for antineoplastic radiation therapy: Secondary | ICD-10-CM | POA: Diagnosis not present

## 2020-04-08 DIAGNOSIS — C61 Malignant neoplasm of prostate: Secondary | ICD-10-CM | POA: Diagnosis not present

## 2020-04-09 ENCOUNTER — Other Ambulatory Visit: Payer: Self-pay

## 2020-04-09 ENCOUNTER — Ambulatory Visit
Admission: RE | Admit: 2020-04-09 | Discharge: 2020-04-09 | Disposition: A | Discharge status: Home / Self Care | Payer: PPO | Source: Ambulatory Visit | Attending: Radiation Oncology | Admitting: Radiation Oncology

## 2020-04-09 DIAGNOSIS — C61 Malignant neoplasm of prostate: Secondary | ICD-10-CM | POA: Diagnosis not present

## 2020-04-09 DIAGNOSIS — Z510 Encounter for antineoplastic radiation therapy: Secondary | ICD-10-CM | POA: Diagnosis not present

## 2020-04-12 ENCOUNTER — Ambulatory Visit
Admission: RE | Admit: 2020-04-12 | Discharge: 2020-04-12 | Disposition: A | Discharge status: Home / Self Care | Payer: PPO | Source: Ambulatory Visit | Attending: Radiation Oncology | Admitting: Radiation Oncology

## 2020-04-12 DIAGNOSIS — C61 Malignant neoplasm of prostate: Secondary | ICD-10-CM | POA: Diagnosis not present

## 2020-04-12 DIAGNOSIS — Z510 Encounter for antineoplastic radiation therapy: Secondary | ICD-10-CM | POA: Diagnosis not present

## 2020-04-13 ENCOUNTER — Other Ambulatory Visit: Payer: Self-pay

## 2020-04-13 ENCOUNTER — Ambulatory Visit
Admission: RE | Admit: 2020-04-13 | Discharge: 2020-04-13 | Disposition: A | Discharge status: Home / Self Care | Payer: PPO | Source: Ambulatory Visit | Attending: Radiation Oncology | Admitting: Radiation Oncology

## 2020-04-13 DIAGNOSIS — C61 Malignant neoplasm of prostate: Secondary | ICD-10-CM | POA: Diagnosis not present

## 2020-04-13 DIAGNOSIS — Z510 Encounter for antineoplastic radiation therapy: Secondary | ICD-10-CM | POA: Insufficient documentation

## 2020-04-13 DIAGNOSIS — C775 Secondary and unspecified malignant neoplasm of intrapelvic lymph nodes: Secondary | ICD-10-CM | POA: Insufficient documentation

## 2020-04-14 ENCOUNTER — Ambulatory Visit
Admission: RE | Admit: 2020-04-14 | Discharge: 2020-04-14 | Disposition: A | Discharge status: Home / Self Care | Payer: PPO | Source: Ambulatory Visit | Attending: Radiation Oncology | Admitting: Radiation Oncology

## 2020-04-14 DIAGNOSIS — Z510 Encounter for antineoplastic radiation therapy: Secondary | ICD-10-CM | POA: Diagnosis not present

## 2020-04-14 DIAGNOSIS — C61 Malignant neoplasm of prostate: Secondary | ICD-10-CM | POA: Diagnosis not present

## 2020-04-15 ENCOUNTER — Ambulatory Visit
Admission: RE | Admit: 2020-04-15 | Discharge: 2020-04-15 | Disposition: A | Discharge status: Home / Self Care | Payer: PPO | Source: Ambulatory Visit | Attending: Radiation Oncology | Admitting: Radiation Oncology

## 2020-04-15 DIAGNOSIS — Z510 Encounter for antineoplastic radiation therapy: Secondary | ICD-10-CM | POA: Diagnosis not present

## 2020-04-15 DIAGNOSIS — C61 Malignant neoplasm of prostate: Secondary | ICD-10-CM | POA: Diagnosis not present

## 2020-04-16 ENCOUNTER — Ambulatory Visit
Admission: RE | Admit: 2020-04-16 | Discharge: 2020-04-16 | Disposition: A | Discharge status: Home / Self Care | Payer: PPO | Source: Ambulatory Visit | Attending: Radiation Oncology | Admitting: Radiation Oncology

## 2020-04-16 DIAGNOSIS — C61 Malignant neoplasm of prostate: Secondary | ICD-10-CM | POA: Diagnosis not present

## 2020-04-16 DIAGNOSIS — Z510 Encounter for antineoplastic radiation therapy: Secondary | ICD-10-CM | POA: Diagnosis not present

## 2020-04-19 ENCOUNTER — Ambulatory Visit
Admission: RE | Admit: 2020-04-19 | Discharge: 2020-04-19 | Disposition: A | Discharge status: Home / Self Care | Payer: PPO | Source: Ambulatory Visit | Attending: Radiation Oncology | Admitting: Radiation Oncology

## 2020-04-19 DIAGNOSIS — C61 Malignant neoplasm of prostate: Secondary | ICD-10-CM | POA: Diagnosis not present

## 2020-04-19 DIAGNOSIS — Z510 Encounter for antineoplastic radiation therapy: Secondary | ICD-10-CM | POA: Diagnosis not present

## 2020-04-20 ENCOUNTER — Other Ambulatory Visit: Payer: Self-pay

## 2020-04-20 ENCOUNTER — Ambulatory Visit
Admission: RE | Admit: 2020-04-20 | Discharge: 2020-04-20 | Disposition: A | Discharge status: Home / Self Care | Payer: PPO | Source: Ambulatory Visit | Attending: Radiation Oncology | Admitting: Radiation Oncology

## 2020-04-20 DIAGNOSIS — Z510 Encounter for antineoplastic radiation therapy: Secondary | ICD-10-CM | POA: Diagnosis not present

## 2020-04-20 DIAGNOSIS — C61 Malignant neoplasm of prostate: Secondary | ICD-10-CM | POA: Diagnosis not present

## 2020-04-21 ENCOUNTER — Other Ambulatory Visit: Payer: Self-pay

## 2020-04-21 ENCOUNTER — Ambulatory Visit
Admission: RE | Admit: 2020-04-21 | Discharge: 2020-04-21 | Disposition: A | Discharge status: Home / Self Care | Payer: PPO | Source: Ambulatory Visit | Attending: Radiation Oncology | Admitting: Radiation Oncology

## 2020-04-21 DIAGNOSIS — C61 Malignant neoplasm of prostate: Secondary | ICD-10-CM | POA: Diagnosis not present

## 2020-04-21 DIAGNOSIS — Z510 Encounter for antineoplastic radiation therapy: Secondary | ICD-10-CM | POA: Diagnosis not present

## 2020-04-22 ENCOUNTER — Ambulatory Visit
Admission: RE | Admit: 2020-04-22 | Discharge: 2020-04-22 | Disposition: A | Discharge status: Home / Self Care | Payer: PPO | Source: Ambulatory Visit | Attending: Radiation Oncology | Admitting: Radiation Oncology

## 2020-04-22 ENCOUNTER — Other Ambulatory Visit: Payer: Self-pay

## 2020-04-22 DIAGNOSIS — Z510 Encounter for antineoplastic radiation therapy: Secondary | ICD-10-CM | POA: Diagnosis not present

## 2020-04-22 DIAGNOSIS — C61 Malignant neoplasm of prostate: Secondary | ICD-10-CM | POA: Diagnosis not present

## 2020-04-23 ENCOUNTER — Ambulatory Visit
Admission: RE | Admit: 2020-04-23 | Discharge: 2020-04-23 | Disposition: A | Discharge status: Home / Self Care | Payer: PPO | Source: Ambulatory Visit | Attending: Radiation Oncology | Admitting: Radiation Oncology

## 2020-04-23 ENCOUNTER — Other Ambulatory Visit: Payer: Self-pay

## 2020-04-23 DIAGNOSIS — Z510 Encounter for antineoplastic radiation therapy: Secondary | ICD-10-CM | POA: Diagnosis not present

## 2020-04-23 DIAGNOSIS — C61 Malignant neoplasm of prostate: Secondary | ICD-10-CM | POA: Diagnosis not present

## 2020-04-26 ENCOUNTER — Ambulatory Visit
Admission: RE | Admit: 2020-04-26 | Discharge: 2020-04-26 | Disposition: A | Discharge status: Home / Self Care | Payer: PPO | Source: Ambulatory Visit | Attending: Radiation Oncology | Admitting: Radiation Oncology

## 2020-04-26 DIAGNOSIS — Z510 Encounter for antineoplastic radiation therapy: Secondary | ICD-10-CM | POA: Diagnosis not present

## 2020-04-26 DIAGNOSIS — C61 Malignant neoplasm of prostate: Secondary | ICD-10-CM | POA: Diagnosis not present

## 2020-04-27 ENCOUNTER — Ambulatory Visit
Admission: RE | Admit: 2020-04-27 | Discharge: 2020-04-27 | Disposition: A | Discharge status: Home / Self Care | Payer: PPO | Source: Ambulatory Visit | Attending: Radiation Oncology | Admitting: Radiation Oncology

## 2020-04-27 DIAGNOSIS — C61 Malignant neoplasm of prostate: Secondary | ICD-10-CM | POA: Diagnosis not present

## 2020-04-27 DIAGNOSIS — Z510 Encounter for antineoplastic radiation therapy: Secondary | ICD-10-CM | POA: Diagnosis not present

## 2020-04-28 ENCOUNTER — Ambulatory Visit
Admission: RE | Admit: 2020-04-28 | Discharge: 2020-04-28 | Disposition: A | Discharge status: Home / Self Care | Payer: PPO | Source: Ambulatory Visit | Attending: Radiation Oncology | Admitting: Radiation Oncology

## 2020-04-28 DIAGNOSIS — C61 Malignant neoplasm of prostate: Secondary | ICD-10-CM | POA: Diagnosis not present

## 2020-04-28 DIAGNOSIS — Z510 Encounter for antineoplastic radiation therapy: Secondary | ICD-10-CM | POA: Diagnosis not present

## 2020-04-29 ENCOUNTER — Ambulatory Visit
Admission: RE | Admit: 2020-04-29 | Discharge: 2020-04-29 | Disposition: A | Discharge status: Home / Self Care | Payer: PPO | Source: Ambulatory Visit | Attending: Radiation Oncology | Admitting: Radiation Oncology

## 2020-04-29 ENCOUNTER — Other Ambulatory Visit: Payer: Self-pay

## 2020-04-29 DIAGNOSIS — C61 Malignant neoplasm of prostate: Secondary | ICD-10-CM | POA: Diagnosis not present

## 2020-04-29 DIAGNOSIS — Z510 Encounter for antineoplastic radiation therapy: Secondary | ICD-10-CM | POA: Diagnosis not present

## 2020-04-30 ENCOUNTER — Ambulatory Visit
Admission: RE | Admit: 2020-04-30 | Discharge: 2020-04-30 | Disposition: A | Discharge status: Home / Self Care | Payer: PPO | Source: Ambulatory Visit | Attending: Radiation Oncology | Admitting: Radiation Oncology

## 2020-04-30 DIAGNOSIS — C61 Malignant neoplasm of prostate: Secondary | ICD-10-CM | POA: Diagnosis not present

## 2020-04-30 DIAGNOSIS — Z510 Encounter for antineoplastic radiation therapy: Secondary | ICD-10-CM | POA: Diagnosis not present

## 2020-05-03 ENCOUNTER — Other Ambulatory Visit: Payer: Self-pay

## 2020-05-03 ENCOUNTER — Ambulatory Visit
Admission: RE | Admit: 2020-05-03 | Discharge: 2020-05-03 | Disposition: A | Discharge status: Home / Self Care | Payer: PPO | Source: Ambulatory Visit | Attending: Radiation Oncology | Admitting: Radiation Oncology

## 2020-05-03 DIAGNOSIS — C61 Malignant neoplasm of prostate: Secondary | ICD-10-CM | POA: Diagnosis not present

## 2020-05-03 DIAGNOSIS — Z510 Encounter for antineoplastic radiation therapy: Secondary | ICD-10-CM | POA: Diagnosis not present

## 2020-05-04 ENCOUNTER — Ambulatory Visit
Admission: RE | Admit: 2020-05-04 | Discharge: 2020-05-04 | Disposition: A | Discharge status: Home / Self Care | Payer: PPO | Source: Ambulatory Visit | Attending: Radiation Oncology | Admitting: Radiation Oncology

## 2020-05-04 DIAGNOSIS — C61 Malignant neoplasm of prostate: Secondary | ICD-10-CM | POA: Diagnosis not present

## 2020-05-04 DIAGNOSIS — Z510 Encounter for antineoplastic radiation therapy: Secondary | ICD-10-CM | POA: Diagnosis not present

## 2020-05-05 ENCOUNTER — Ambulatory Visit: Payer: PPO

## 2020-05-06 ENCOUNTER — Other Ambulatory Visit: Payer: Self-pay

## 2020-05-06 ENCOUNTER — Ambulatory Visit
Admission: RE | Admit: 2020-05-06 | Discharge: 2020-05-06 | Disposition: A | Discharge status: Home / Self Care | Payer: PPO | Source: Ambulatory Visit | Attending: Radiation Oncology | Admitting: Radiation Oncology

## 2020-05-06 DIAGNOSIS — C61 Malignant neoplasm of prostate: Secondary | ICD-10-CM | POA: Diagnosis not present

## 2020-05-06 DIAGNOSIS — Z510 Encounter for antineoplastic radiation therapy: Secondary | ICD-10-CM | POA: Diagnosis not present

## 2020-05-07 ENCOUNTER — Ambulatory Visit
Admission: RE | Admit: 2020-05-07 | Discharge: 2020-05-07 | Disposition: A | Discharge status: Home / Self Care | Payer: PPO | Source: Ambulatory Visit | Attending: Radiation Oncology | Admitting: Radiation Oncology

## 2020-05-07 DIAGNOSIS — Z510 Encounter for antineoplastic radiation therapy: Secondary | ICD-10-CM | POA: Diagnosis not present

## 2020-05-07 DIAGNOSIS — C61 Malignant neoplasm of prostate: Secondary | ICD-10-CM | POA: Diagnosis not present

## 2020-05-10 ENCOUNTER — Ambulatory Visit
Admission: RE | Admit: 2020-05-10 | Discharge: 2020-05-10 | Disposition: A | Discharge status: Home / Self Care | Payer: PPO | Source: Ambulatory Visit | Attending: Radiation Oncology | Admitting: Radiation Oncology

## 2020-05-10 ENCOUNTER — Other Ambulatory Visit: Payer: Self-pay

## 2020-05-10 DIAGNOSIS — Z510 Encounter for antineoplastic radiation therapy: Secondary | ICD-10-CM | POA: Diagnosis not present

## 2020-05-10 DIAGNOSIS — C61 Malignant neoplasm of prostate: Secondary | ICD-10-CM | POA: Diagnosis not present

## 2020-05-11 ENCOUNTER — Ambulatory Visit
Admission: RE | Admit: 2020-05-11 | Discharge: 2020-05-11 | Disposition: A | Discharge status: Home / Self Care | Payer: PPO | Source: Ambulatory Visit | Attending: Radiation Oncology | Admitting: Radiation Oncology

## 2020-05-11 DIAGNOSIS — C61 Malignant neoplasm of prostate: Secondary | ICD-10-CM | POA: Diagnosis not present

## 2020-05-11 DIAGNOSIS — Z510 Encounter for antineoplastic radiation therapy: Secondary | ICD-10-CM | POA: Diagnosis not present

## 2020-05-12 ENCOUNTER — Ambulatory Visit
Admission: RE | Admit: 2020-05-12 | Discharge: 2020-05-12 | Disposition: A | Discharge status: Home / Self Care | Payer: PPO | Source: Ambulatory Visit | Attending: Radiation Oncology | Admitting: Radiation Oncology

## 2020-05-12 ENCOUNTER — Ambulatory Visit: Payer: PPO

## 2020-05-12 ENCOUNTER — Other Ambulatory Visit: Payer: Self-pay

## 2020-05-12 DIAGNOSIS — Z510 Encounter for antineoplastic radiation therapy: Secondary | ICD-10-CM | POA: Diagnosis not present

## 2020-05-12 DIAGNOSIS — C61 Malignant neoplasm of prostate: Secondary | ICD-10-CM | POA: Diagnosis not present

## 2020-05-13 ENCOUNTER — Ambulatory Visit: Payer: PPO

## 2020-05-13 ENCOUNTER — Ambulatory Visit
Admission: RE | Admit: 2020-05-13 | Discharge: 2020-05-13 | Disposition: A | Discharge status: Home / Self Care | Payer: PPO | Source: Ambulatory Visit | Attending: Radiation Oncology | Admitting: Radiation Oncology

## 2020-05-13 DIAGNOSIS — Z510 Encounter for antineoplastic radiation therapy: Secondary | ICD-10-CM | POA: Diagnosis not present

## 2020-05-13 DIAGNOSIS — C61 Malignant neoplasm of prostate: Secondary | ICD-10-CM | POA: Diagnosis not present

## 2020-05-14 ENCOUNTER — Ambulatory Visit
Admission: RE | Admit: 2020-05-14 | Discharge: 2020-05-14 | Disposition: A | Discharge status: Home / Self Care | Payer: PPO | Source: Ambulatory Visit | Attending: Radiation Oncology | Admitting: Radiation Oncology

## 2020-05-14 ENCOUNTER — Other Ambulatory Visit: Payer: Self-pay

## 2020-05-14 DIAGNOSIS — C61 Malignant neoplasm of prostate: Secondary | ICD-10-CM | POA: Insufficient documentation

## 2020-05-14 DIAGNOSIS — Z510 Encounter for antineoplastic radiation therapy: Secondary | ICD-10-CM | POA: Diagnosis not present

## 2020-05-14 DIAGNOSIS — C775 Secondary and unspecified malignant neoplasm of intrapelvic lymph nodes: Secondary | ICD-10-CM | POA: Diagnosis not present

## 2020-05-17 ENCOUNTER — Other Ambulatory Visit: Payer: Self-pay

## 2020-05-17 ENCOUNTER — Ambulatory Visit
Admission: RE | Admit: 2020-05-17 | Discharge: 2020-05-17 | Disposition: A | Discharge status: Home / Self Care | Payer: PPO | Source: Ambulatory Visit | Attending: Radiation Oncology | Admitting: Radiation Oncology

## 2020-05-17 DIAGNOSIS — Z510 Encounter for antineoplastic radiation therapy: Secondary | ICD-10-CM | POA: Diagnosis not present

## 2020-05-17 DIAGNOSIS — C61 Malignant neoplasm of prostate: Secondary | ICD-10-CM | POA: Diagnosis not present

## 2020-05-18 ENCOUNTER — Ambulatory Visit
Admission: RE | Admit: 2020-05-18 | Discharge: 2020-05-18 | Disposition: A | Discharge status: Home / Self Care | Payer: PPO | Source: Ambulatory Visit | Attending: Radiation Oncology | Admitting: Radiation Oncology

## 2020-05-18 DIAGNOSIS — Z510 Encounter for antineoplastic radiation therapy: Secondary | ICD-10-CM | POA: Diagnosis not present

## 2020-05-18 DIAGNOSIS — C61 Malignant neoplasm of prostate: Secondary | ICD-10-CM | POA: Diagnosis not present

## 2020-05-19 ENCOUNTER — Ambulatory Visit
Admission: RE | Admit: 2020-05-19 | Discharge: 2020-05-19 | Disposition: A | Discharge status: Home / Self Care | Payer: PPO | Source: Ambulatory Visit | Attending: Radiation Oncology | Admitting: Radiation Oncology

## 2020-05-19 DIAGNOSIS — Z510 Encounter for antineoplastic radiation therapy: Secondary | ICD-10-CM | POA: Diagnosis not present

## 2020-05-19 DIAGNOSIS — C61 Malignant neoplasm of prostate: Secondary | ICD-10-CM | POA: Diagnosis not present

## 2020-05-20 ENCOUNTER — Ambulatory Visit
Admission: RE | Admit: 2020-05-20 | Discharge: 2020-05-20 | Disposition: A | Discharge status: Home / Self Care | Payer: PPO | Source: Ambulatory Visit | Attending: Radiation Oncology | Admitting: Radiation Oncology

## 2020-05-20 DIAGNOSIS — C61 Malignant neoplasm of prostate: Secondary | ICD-10-CM | POA: Diagnosis not present

## 2020-05-20 DIAGNOSIS — Z510 Encounter for antineoplastic radiation therapy: Secondary | ICD-10-CM | POA: Diagnosis not present

## 2020-05-21 ENCOUNTER — Other Ambulatory Visit: Payer: Self-pay

## 2020-05-21 ENCOUNTER — Other Ambulatory Visit: Payer: Self-pay | Admitting: Radiation Oncology

## 2020-05-21 ENCOUNTER — Ambulatory Visit
Admission: RE | Admit: 2020-05-21 | Discharge: 2020-05-21 | Disposition: A | Discharge status: Home / Self Care | Payer: PPO | Source: Ambulatory Visit | Attending: Radiation Oncology | Admitting: Radiation Oncology

## 2020-05-21 DIAGNOSIS — C61 Malignant neoplasm of prostate: Secondary | ICD-10-CM | POA: Diagnosis not present

## 2020-05-21 DIAGNOSIS — Z510 Encounter for antineoplastic radiation therapy: Secondary | ICD-10-CM | POA: Diagnosis not present

## 2020-05-21 MED ORDER — HYDROCORTISONE ACETATE 25 MG RE SUPP: 25.0000 mg | Freq: Two times a day (BID) | RECTAL | 0 refills | Status: DC

## 2020-05-24 ENCOUNTER — Ambulatory Visit
Admission: RE | Admit: 2020-05-24 | Discharge: 2020-05-24 | Disposition: A | Discharge status: Home / Self Care | Payer: PPO | Source: Ambulatory Visit | Attending: Radiation Oncology | Admitting: Radiation Oncology

## 2020-05-24 DIAGNOSIS — C61 Malignant neoplasm of prostate: Secondary | ICD-10-CM | POA: Diagnosis not present

## 2020-05-24 DIAGNOSIS — Z510 Encounter for antineoplastic radiation therapy: Secondary | ICD-10-CM | POA: Diagnosis not present

## 2020-05-25 ENCOUNTER — Other Ambulatory Visit: Payer: Self-pay

## 2020-05-25 ENCOUNTER — Ambulatory Visit
Admission: RE | Admit: 2020-05-25 | Discharge: 2020-05-25 | Disposition: A | Discharge status: Home / Self Care | Payer: PPO | Source: Ambulatory Visit | Attending: Radiation Oncology | Admitting: Radiation Oncology

## 2020-05-25 DIAGNOSIS — Z510 Encounter for antineoplastic radiation therapy: Secondary | ICD-10-CM | POA: Diagnosis not present

## 2020-05-25 DIAGNOSIS — C61 Malignant neoplasm of prostate: Secondary | ICD-10-CM | POA: Diagnosis not present

## 2020-05-26 ENCOUNTER — Ambulatory Visit
Admission: RE | Admit: 2020-05-26 | Discharge: 2020-05-26 | Disposition: A | Discharge status: Home / Self Care | Payer: PPO | Source: Ambulatory Visit | Attending: Radiation Oncology | Admitting: Radiation Oncology

## 2020-05-26 DIAGNOSIS — Z510 Encounter for antineoplastic radiation therapy: Secondary | ICD-10-CM | POA: Diagnosis not present

## 2020-05-26 DIAGNOSIS — C61 Malignant neoplasm of prostate: Secondary | ICD-10-CM | POA: Diagnosis not present

## 2020-05-27 ENCOUNTER — Other Ambulatory Visit: Payer: Self-pay

## 2020-05-27 ENCOUNTER — Ambulatory Visit
Admission: RE | Admit: 2020-05-27 | Discharge: 2020-05-27 | Disposition: A | Discharge status: Home / Self Care | Payer: PPO | Source: Ambulatory Visit | Attending: Radiation Oncology | Admitting: Radiation Oncology

## 2020-05-27 DIAGNOSIS — Z510 Encounter for antineoplastic radiation therapy: Secondary | ICD-10-CM | POA: Diagnosis not present

## 2020-05-27 DIAGNOSIS — C61 Malignant neoplasm of prostate: Secondary | ICD-10-CM | POA: Diagnosis not present

## 2020-05-28 ENCOUNTER — Ambulatory Visit
Admission: RE | Admit: 2020-05-28 | Discharge: 2020-05-28 | Disposition: A | Discharge status: Home / Self Care | Payer: PPO | Source: Ambulatory Visit | Attending: Radiation Oncology | Admitting: Radiation Oncology

## 2020-05-28 ENCOUNTER — Ambulatory Visit: Payer: PPO

## 2020-05-28 DIAGNOSIS — Z510 Encounter for antineoplastic radiation therapy: Secondary | ICD-10-CM | POA: Diagnosis not present

## 2020-05-28 DIAGNOSIS — C61 Malignant neoplasm of prostate: Secondary | ICD-10-CM | POA: Diagnosis not present

## 2020-05-31 ENCOUNTER — Other Ambulatory Visit: Payer: Self-pay

## 2020-05-31 ENCOUNTER — Encounter: Payer: Self-pay | Admitting: Urology

## 2020-05-31 ENCOUNTER — Ambulatory Visit
Admission: RE | Admit: 2020-05-31 | Discharge: 2020-05-31 | Disposition: A | Discharge status: Home / Self Care | Payer: PPO | Source: Ambulatory Visit | Attending: Radiation Oncology | Admitting: Radiation Oncology

## 2020-05-31 DIAGNOSIS — Z510 Encounter for antineoplastic radiation therapy: Secondary | ICD-10-CM | POA: Diagnosis not present

## 2020-05-31 DIAGNOSIS — C61 Malignant neoplasm of prostate: Secondary | ICD-10-CM | POA: Diagnosis not present

## 2020-06-02 DIAGNOSIS — C775 Secondary and unspecified malignant neoplasm of intrapelvic lymph nodes: Secondary | ICD-10-CM | POA: Diagnosis not present

## 2020-06-02 DIAGNOSIS — N393 Stress incontinence (female) (male): Secondary | ICD-10-CM | POA: Diagnosis not present

## 2020-06-02 DIAGNOSIS — N481 Balanitis: Secondary | ICD-10-CM | POA: Diagnosis not present

## 2020-06-02 DIAGNOSIS — R8271 Bacteriuria: Secondary | ICD-10-CM | POA: Diagnosis not present

## 2020-06-02 DIAGNOSIS — C61 Malignant neoplasm of prostate: Secondary | ICD-10-CM | POA: Diagnosis not present

## 2020-06-02 DIAGNOSIS — K627 Radiation proctitis: Secondary | ICD-10-CM | POA: Diagnosis not present

## 2020-06-22 DIAGNOSIS — Z0000 Encounter for general adult medical examination without abnormal findings: Secondary | ICD-10-CM | POA: Diagnosis not present

## 2020-06-22 DIAGNOSIS — Z1389 Encounter for screening for other disorder: Secondary | ICD-10-CM | POA: Diagnosis not present

## 2020-07-01 NOTE — Progress Notes (Signed)
Call patient to do meaningful use for his appointment with [facility] on 07/07/2020 . Left voice message

## 2020-07-07 ENCOUNTER — Ambulatory Visit
Admission: RE | Admit: 2020-07-07 | Discharge: 2020-07-07 | Disposition: A | Discharge status: Home / Self Care | Payer: PPO | Source: Ambulatory Visit | Attending: Urology | Admitting: Urology

## 2020-07-07 ENCOUNTER — Other Ambulatory Visit: Payer: Self-pay

## 2020-07-07 DIAGNOSIS — C61 Malignant neoplasm of prostate: Secondary | ICD-10-CM

## 2020-07-07 DIAGNOSIS — C775 Secondary and unspecified malignant neoplasm of intrapelvic lymph nodes: Secondary | ICD-10-CM

## 2020-07-07 NOTE — Progress Notes (Signed)
Radiation Oncology         [phone] ________________________________  Name: DUC CROCKET MRN: [id number]  Date: 07/07/2020  DOB: 1945-06-19  Post Treatment Note  CC: Lawerance Cruel, MD  Irine Seal, MD  Diagnosis:   75 y.o. 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  Interval Since Last Radiation:  5.5 weeks s/p fossa re-irradiation 04/07/20 - 05/31/20: 1. The prostate fossa and pelvic lymph nodes were initially treated to 45 Gy in 25 fractions of 1.8 Gy  2. The prostate fossa only was boosted to 68.4 Gy with 13 additional fractions of 1.8 Gy   1992: Salvage prostate fossa radiation  Narrative:  I spoke with the patient to conduct his routine scheduled 1 month follow up visit via telephone to spare the patient unnecessary potential exposure in the healthcare setting during the current COVID-19 pandemic.  The patient was notified in advance and gave permission to proceed with this visit format.  He tolerated radiation treatment relatively well with only minor urinary irritation and modest fatigue.  He did experience some mild dysuria at the start and completion of his stream as well as increased nocturia x5 and a mild increase in incontinence which has been present since the time of his surgery.  He specifically denied gross hematuria, straining to void, incomplete bladder emptying, fever or chills.  He did experience some loose bowels which were improved with Imodium as needed.                              On review of systems, the patient states that he is doing very well in general.  He has had gradual improvement in the LUTS and feels that he is pretty much back to his baseline at this point.  He specifically denies dysuria, gross hematuria, straining to void, or incomplete bladder emptying.  He did experience some dysgeusia and decreased appetite but feels that  this is gradually improving as well and he is maintaining his weight.  His bowels are also much improved and back to normal at this point.  He specifically denies any abdominal pain, nausea, vomiting, diarrhea or constipation.  Overall, he is quite pleased with his progress to date.  ALLERGIES:  is allergic to neosporin [bacitracin-polymyxin b] and penicillins.  Meds: Current Outpatient Medications  Medication Sig Dispense Refill  . fluticasone (FLONASE) 50 MCG/ACT nasal spray 1 spray in each nostril    . hydrocortisone (ANUSOL-HC) 25 MG suppository Place 1 suppository (25 mg total) rectally 2 (two) times daily. 12 suppository 0  . ibuprofen (ADVIL,MOTRIN) 200 MG tablet Take 200 mg by mouth every 6 (six) hours as needed for pain.    . Melatonin 10 MG SUBL 1 tablet at bedtime as needed with food    . aspirin EC 81 MG tablet Take 1 tablet (81 mg total) by mouth daily. (Patient not taking: Reported on 07/07/2020)    . diphenhydramine-acetaminophen (TYLENOL PM EXTRA STRENGTH) 25-500 MG TABS tablet 1 tab (Patient not taking: Reported on 07/07/2020)    . diphenhydrAMINE-APAP, sleep, (TYLENOL PM EXTRA STRENGTH) 50-1000 MG/30ML LIQD 1 tab (Patient not taking: Reported on 07/07/2020)    . nystatin cream (MYCOSTATIN) SMARTSIG:Sparingly Topical 2-3 Times Daily PRN (Patient not taking: Reported on 07/07/2020)     No current facility-administered medications for this encounter.    Physical Findings:  vitals were not taken for this visit.   Karen Kays to assess due to telephone follow-up visit format.  Lab Findings: Lab Results  Component Value Date   WBC 7.1 02/19/2017   HGB 13.5 02/19/2017   HCT 40.0 02/19/2017   MCV 94.3 02/19/2017   PLT 180 02/19/2017     Radiographic Findings: No results found.  Impression/Plan: 1. 75 y.o. 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 He will  continue to follow up with urology for ongoing PSA determinations and has an appointment scheduled with Dr. Jeffie Pollock in July 2022. He understands what to expect with regards to PSA monitoring going forward. I will look forward to following his response to treatment via correspondence with urology, and would be happy to continue to participate in his care if clinically indicated. I talked to the patient about what to expect in the future, including his risk for erectile dysfunction and rectal bleeding. I encouraged him to call or return to the office if he has any questions regarding his previous radiation or possible radiation side effects. He was comfortable with this plan and will follow up as needed.    Nicholos Johns, PA-C

## 2020-07-07 NOTE — Progress Notes (Addendum)
Patient denies any fatigue. Patient denies any dysuria or hematuria. Patient states that he is incontinent.(did not answer any other questions about urination) Patient states that he has some constipation and diarrhea but he states that it is manageable.Patient states that he last seen his urologist 4 weeks ago. States that he has an appointment in 6 weeks.IPPS was not done patient states that he was incontinent of bladder

## 2020-07-07 NOTE — Progress Notes (Addendum)
  Radiation Oncology         [phone] ________________________________  Name: Logan Wolfe MRN: [id number]  Date: 05/31/2020  DOB: [birth year]  End of Treatment Note  Diagnosis:   75 y.o. 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     Indication for treatment:  Curative, Definitive Radiotherapy       Radiation treatment dates:   04/07/20 - 05/31/20  Site/dose:  1. The prostate fossa and pelvic lymph nodes were initially treated to 45 Gy in 25 fractions of 1.8 Gy  2. The prostate fossa and PET positive pelvic nodes were boosted to 71 Gy with 13 additional fractions of 2 Gy   Beams/energy:  1. The prostate fossa  and pelvic lymph nodes were initially treated using VMAT intensity modulated radiotherapy delivering 6 megavolt photons. Image guidance was performed with CB-CT studies prior to each fraction. He was immobilized with a body fix lower extremity mold.  2. The prostate fossa and PET positive pelvic lymph nodes were boosted using VMAT intensity modulated radiotherapy delivering 6 megavolt photons. Image guidance was performed with CB-CT studies prior to each fraction. He was immobilized with a body fix lower extremity mold.  Narrative: The patient tolerated radiation treatment relatively well with only minor urinary irritation and modest fatigue.  He did experience some mild dysuria at the start and completion of his stream as well as increased nocturia x5 and a mild increase in incontinence which has been present since the time of his surgery.  He specifically denied gross hematuria, straining to void, incomplete bladder emptying, fever or chills.  He did experience some loose bowels which were improved with Imodium as needed.  Plan: The patient has completed radiation treatment. He will return to radiation oncology clinic for routine followup in one month. I advised him  to call or return sooner if he has any questions or concerns related to his recovery or treatment. ________________________________  Sheral Apley. Tammi Klippel, M.D.

## 2020-07-09 DIAGNOSIS — R300 Dysuria: Secondary | ICD-10-CM | POA: Diagnosis not present

## 2020-07-21 DIAGNOSIS — R300 Dysuria: Secondary | ICD-10-CM | POA: Diagnosis not present

## 2020-07-21 DIAGNOSIS — N3040 Irradiation cystitis without hematuria: Secondary | ICD-10-CM | POA: Diagnosis not present

## 2020-07-21 DIAGNOSIS — R3915 Urgency of urination: Secondary | ICD-10-CM | POA: Diagnosis not present

## 2020-09-01 DIAGNOSIS — C61 Malignant neoplasm of prostate: Secondary | ICD-10-CM | POA: Diagnosis not present

## 2020-09-08 DIAGNOSIS — R300 Dysuria: Secondary | ICD-10-CM | POA: Diagnosis not present

## 2020-09-08 DIAGNOSIS — C775 Secondary and unspecified malignant neoplasm of intrapelvic lymph nodes: Secondary | ICD-10-CM | POA: Diagnosis not present

## 2020-09-08 DIAGNOSIS — C61 Malignant neoplasm of prostate: Secondary | ICD-10-CM | POA: Diagnosis not present

## 2020-09-08 DIAGNOSIS — N3946 Mixed incontinence: Secondary | ICD-10-CM | POA: Diagnosis not present

## 2020-11-20 DIAGNOSIS — Z23 Encounter for immunization: Secondary | ICD-10-CM | POA: Diagnosis not present

## 2020-12-01 DIAGNOSIS — R9721 Rising PSA following treatment for malignant neoplasm of prostate: Secondary | ICD-10-CM | POA: Diagnosis not present

## 2020-12-08 DIAGNOSIS — C61 Malignant neoplasm of prostate: Secondary | ICD-10-CM | POA: Diagnosis not present

## 2020-12-08 DIAGNOSIS — N3946 Mixed incontinence: Secondary | ICD-10-CM | POA: Diagnosis not present

## 2021-03-02 DIAGNOSIS — C61 Malignant neoplasm of prostate: Secondary | ICD-10-CM | POA: Diagnosis not present

## 2021-03-09 DIAGNOSIS — C61 Malignant neoplasm of prostate: Secondary | ICD-10-CM | POA: Diagnosis not present

## 2021-03-09 DIAGNOSIS — N3946 Mixed incontinence: Secondary | ICD-10-CM | POA: Diagnosis not present

## 2021-03-09 DIAGNOSIS — C775 Secondary and unspecified malignant neoplasm of intrapelvic lymph nodes: Secondary | ICD-10-CM | POA: Diagnosis not present

## 2021-03-09 DIAGNOSIS — R8271 Bacteriuria: Secondary | ICD-10-CM | POA: Diagnosis not present

## 2021-04-11 DIAGNOSIS — H52223 Regular astigmatism, bilateral: Secondary | ICD-10-CM | POA: Diagnosis not present

## 2021-04-11 DIAGNOSIS — H25813 Combined forms of age-related cataract, bilateral: Secondary | ICD-10-CM | POA: Diagnosis not present

## 2021-04-11 DIAGNOSIS — H35031 Hypertensive retinopathy, right eye: Secondary | ICD-10-CM | POA: Diagnosis not present

## 2021-04-11 DIAGNOSIS — H524 Presbyopia: Secondary | ICD-10-CM | POA: Diagnosis not present

## 2021-04-11 DIAGNOSIS — H0102A Squamous blepharitis right eye, upper and lower eyelids: Secondary | ICD-10-CM | POA: Diagnosis not present

## 2021-04-11 DIAGNOSIS — H5203 Hypermetropia, bilateral: Secondary | ICD-10-CM | POA: Diagnosis not present

## 2021-04-11 DIAGNOSIS — H40013 Open angle with borderline findings, low risk, bilateral: Secondary | ICD-10-CM | POA: Diagnosis not present

## 2021-05-11 ENCOUNTER — Ambulatory Visit: Payer: PPO | Admitting: Interventional Cardiology

## 2021-06-08 DIAGNOSIS — C61 Malignant neoplasm of prostate: Secondary | ICD-10-CM | POA: Diagnosis not present

## 2021-06-15 DIAGNOSIS — R351 Nocturia: Secondary | ICD-10-CM | POA: Diagnosis not present

## 2021-06-15 DIAGNOSIS — C61 Malignant neoplasm of prostate: Secondary | ICD-10-CM | POA: Diagnosis not present

## 2021-06-15 DIAGNOSIS — C775 Secondary and unspecified malignant neoplasm of intrapelvic lymph nodes: Secondary | ICD-10-CM | POA: Diagnosis not present

## 2021-06-15 DIAGNOSIS — N393 Stress incontinence (female) (male): Secondary | ICD-10-CM | POA: Diagnosis not present

## 2021-06-27 DIAGNOSIS — R011 Cardiac murmur, unspecified: Secondary | ICD-10-CM | POA: Diagnosis not present

## 2021-06-27 DIAGNOSIS — Z79899 Other long term (current) drug therapy: Secondary | ICD-10-CM | POA: Diagnosis not present

## 2021-06-27 DIAGNOSIS — E7800 Pure hypercholesterolemia, unspecified: Secondary | ICD-10-CM | POA: Diagnosis not present

## 2021-06-27 DIAGNOSIS — M461 Sacroiliitis, not elsewhere classified: Secondary | ICD-10-CM | POA: Diagnosis not present

## 2021-06-27 DIAGNOSIS — Z0000 Encounter for general adult medical examination without abnormal findings: Secondary | ICD-10-CM | POA: Diagnosis not present

## 2021-06-27 DIAGNOSIS — K589 Irritable bowel syndrome without diarrhea: Secondary | ICD-10-CM | POA: Diagnosis not present

## 2021-07-18 ENCOUNTER — Ambulatory Visit: Payer: PPO | Attending: Family Medicine | Admitting: Physical Therapy

## 2021-07-18 ENCOUNTER — Encounter: Payer: Self-pay | Admitting: Physical Therapy

## 2021-07-18 DIAGNOSIS — R293 Abnormal posture: Secondary | ICD-10-CM | POA: Diagnosis not present

## 2021-07-18 DIAGNOSIS — M461 Sacroiliitis, not elsewhere classified: Secondary | ICD-10-CM | POA: Insufficient documentation

## 2021-07-18 DIAGNOSIS — M6281 Muscle weakness (generalized): Secondary | ICD-10-CM | POA: Insufficient documentation

## 2021-07-18 DIAGNOSIS — R29898 Other symptoms and signs involving the musculoskeletal system: Secondary | ICD-10-CM | POA: Diagnosis not present

## 2021-07-18 NOTE — Therapy (Signed)
OUTPATIENT PHYSICAL THERAPY THORACOLUMBAR EVALUATION   Patient Name: Logan Wolfe MRN: [id number] DOB:January 21, 1946, 76 y.o., male Today's Date: 07/18/2021   PT End of Session - 07/18/21 1055     Visit Number 1    Number of Visits 13    Date for PT Re-Evaluation 08/29/21    Authorization Type HTA    Progress Note Due on Visit 10    PT Start Time 1018    PT Stop Time 1057    PT Time Calculation (min) 39 min    Activity Tolerance Patient tolerated treatment well    Behavior During Therapy WFL for tasks assessed/performed             Past Medical History:  Diagnosis Date   Allergic rhinitis, cause unspecified    Anxiety    Aortic valve calcification    Cancer ([place])    Prostate CA, Dr Jeffie Pollock 22 years ago   Constipation    Depressive disorder    Dyspepsia and other specified disorders of function of stomach    GERD (gastroesophageal reflux disease)    Hypoglycemia    Memory loss    Otitis    externa of right ear   Prostate cancer ([place])    Spinal stenosis    Unspecified pruritic disorder    Past Surgical History:  Procedure Laterality Date   PROSTATE SURGERY     Patient Active Problem List   Diagnosis Date Noted   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[place]) 03/23/2020   Diabetes mellitus screening 03/22/2020   Dysphagia 03/22/2020   Dyspnea on exertion 03/22/2020   High cholesterol 03/22/2020   Insomnia 03/22/2020   Lymphocytic colitis 03/22/2020   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[place]) 03/22/2020   Personal history of colonic polyps 03/22/2020   Spinal stenosis 03/22/2020   Dysfunction of right eustachian tube 12/30/2015   Sensorineural hearing loss 12/30/2015   Increased frequency of urination 01/09/2014   Acute, but ill-defined, cerebrovascular disease 12/27/2012   Occlusion and stenosis of carotid artery 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 cerebral infarction 12/05/2012   Aortic valve disorder 12/05/2012   Cancer ([place])    GERD  (gastroesophageal reflux disease)    Hypoglycemia    Depressive disorder    Otitis     PCP: Tamsen Meek   REFERRING PROVIDER: Lawerance Cruel, MD   REFERRING DIAG: M46.1 (ICD-10-CM) - Sacroiliitis, not elsewhere classified   Rationale for Evaluation and Treatment Rehabilitation  THERAPY DIAG:  Sacroiliitis ([place]) - Plan: PT plan of care cert/re-cert  Muscle weakness (generalized) - Plan: PT plan of care cert/re-cert  Other symptoms and signs involving the musculoskeletal system - Plan: PT plan of care cert/re-cert  ONSET DATE: 08/09/9483   SUBJECTIVE:  SUBJECTIVE STATEMENT: In the past few months I've started to walk like an old man which I don't like my pain is on the right side of my lower back and across my upper hips in the back and sometimes in my tailbone. I have pain when I first stand up and walk then it works itself out. Pain is always there but not disabling.   PERTINENT HISTORY:  Aortic valve disorder, cerebrovascular disease, hearing loss, prostate CA with malignant neoplasm to lymph node  PAIN:  Are you having pain? Yes: NPRS scale: 2/10 Pain location: across hips in the back R>L Pain description: throb  Aggravating factors: lifting heavy (40#) Relieving factors: not lifting heavy but never goes away    PRECAUTIONS: None  WEIGHT BEARING RESTRICTIONS No  FALLS:  Has patient fallen in last 6 months? No  LIVING ENVIRONMENT: Lives with: lives with their spouse Lives in: House/apartment Stairs: Yes: Internal: 16 steps; on right going up and External: 2 steps; none Has following equipment at home: None  OCCUPATION: retired   PLOF: Independent, Independent with basic ADLs, Independent with gait, and Independent with transfers  PATIENT GOALS get rid of pain and not  walk like an old man    OBJECTIVE:    PATIENT SURVEYS:  LEFS 49/80  SCREENING FOR RED FLAGS: Bowel or bladder incontinence: No chronic not new  Spinal tumors: No Cauda equina syndrome: No Compression fracture: No Abdominal aneurysm: No  COGNITION:  Overall cognitive status: Within functional limits for tasks assessed      MUSCLE LENGTH:  Hamstrings severely tight B Piriformis severely tight B L LE about 1/4 inch longer than R LE   POSTURE: rounded shoulders, forward head, decreased lumbar lordosis, and increased thoracic kyphosis  PALPATION: Very TTP at L5/S1 level especially on the R   LUMBAR ROM:   Active  A/PROM  eval  Flexion WNL   Extension WNL but stiff feeling   Right lateral flexion WNL but pain on the R like a stabbing feeling    Left lateral flexion WNL but pain on the L   Right rotation   Left rotation    (Blank rows = not tested)   LOWER EXTREMITY MMT:    MMT Right eval Left eval  Hip flexion 3+/5 3+/5  Hip extension 3-/5 3/5  Hip abduction 3/5 3/5  Hip adduction    Hip internal rotation    Hip external rotation    Knee flexion 4/5 4/5  Knee extension 5/5 5/5  Ankle dorsiflexion 5/5 5/5  Ankle plantarflexion    Ankle inversion    Ankle eversion     (Blank rows = not tested)  LUMBAR SPECIAL TESTS:  Straight leg raise test: Negative       TODAY'S TREATMENT  Nustep L4 BLEs x 6 minutes   Hamstring stretch 2x30 seconds B Piriformis stretch 2x30 seconds B  Bridges 1x10    PATIENT EDUCATION:  Education details: exam findings, POC, HEP  Person educated: Patient Education method: Customer service manager Education comprehension: verbalized understanding and returned demonstration   HOME EXERCISE PROGRAM: [id number]  ASSESSMENT:  CLINICAL IMPRESSION: Patient is a 76 y.o. male who was seen today for physical therapy evaluation and treatment for sacroilitis. Exam reveals quit a bit of muscle weakness and stiffness, also  seems to have some segmental hypomobility in the lower spine. Will benefit from skilled PT services to address impairments and pain.    OBJECTIVE IMPAIRMENTS Abnormal gait, difficulty walking, decreased ROM, decreased strength, decreased safety  awareness, hypomobility, increased fascial restrictions, increased muscle spasms, improper body mechanics, postural dysfunction, and pain.   ACTIVITY LIMITATIONS carrying, lifting, squatting, stairs, transfers, bed mobility, and locomotion level  PARTICIPATION LIMITATIONS: community activity and yard work  [place], Past/current experiences, and Time since onset of injury/illness/exacerbation are also affecting patient's functional outcome.   REHAB POTENTIAL: Good  CLINICAL DECISION MAKING: Stable/uncomplicated  EVALUATION COMPLEXITY: Low   GOALS: Goals reviewed with patient? No  SHORT TERM GOALS: Target date: 08/08/2021  Will be compliant with HEP  Baseline: Goal status: INITIAL  2.  Will experience pain no more than 2/10  at worst  Baseline:  Goal status: INITIAL  3.  Will be able to stand and take steps without initial pain or catching Baseline:  Goal status: INITIAL   LONG TERM GOALS: Target date: 08/29/2021  MMT to improve by one grade in all weak groups  Baseline:  Goal status: INITIAL  2.  Will demonstrate good lifting mechanics from floor to waist height in order to prevent exacerbation of pain  Baseline:  Goal status: INITIAL  3.  LEFS score to improve by at least 15 points to show improvement in condition  Baseline:  Goal status: INITIAL  4.  Will be able to squat and do a flight of stairs without increase in pain  Baseline:  Goal status: INITIAL     PLAN: PT FREQUENCY: 2x/week  PT DURATION: 6 weeks  PLANNED INTERVENTIONS: Therapeutic exercises, Therapeutic activity, Neuromuscular re-education, Balance training, Gait training, Patient/Family education, Joint mobilization, Stair training, Dry  Needling, Electrical stimulation, Spinal mobilization, Cryotherapy, Moist heat, scar mobilization, Taping, Traction, Ultrasound, Ionotophoresis '4mg'$ /ml Dexamethasone, Manual therapy, and Re-evaluation.  PLAN FOR NEXT SESSION: functional stretching and strengthening, dry needling?    Haley Roza U PT DPT PN2  07/18/2021, 11:08 AM

## 2021-07-25 ENCOUNTER — Encounter: Payer: Self-pay | Admitting: Physical Therapy

## 2021-07-25 ENCOUNTER — Ambulatory Visit: Payer: PPO | Admitting: Physical Therapy

## 2021-07-25 DIAGNOSIS — R29898 Other symptoms and signs involving the musculoskeletal system: Secondary | ICD-10-CM

## 2021-07-25 DIAGNOSIS — M461 Sacroiliitis, not elsewhere classified: Secondary | ICD-10-CM

## 2021-07-25 DIAGNOSIS — M6281 Muscle weakness (generalized): Secondary | ICD-10-CM

## 2021-07-25 NOTE — Therapy (Signed)
OUTPATIENT PHYSICAL THERAPY THORACOLUMBAR EVALUATION   Patient Name: Logan Wolfe MRN: [id number] DOB:1945-11-26, 76 y.o., male Today's Date: 07/25/2021   PT End of Session - 07/25/21 1101     Visit Number 2    Date for PT Re-Evaluation 08/29/21    PT Start Time 35    PT Stop Time 1145    PT Time Calculation (min) 45 min    Activity Tolerance Patient tolerated treatment well    Behavior During Therapy [facility] for tasks assessed/performed             Past Medical History:  Diagnosis Date   Allergic rhinitis, cause unspecified    Anxiety    Aortic valve calcification    Cancer ([place])    Prostate CA, Dr Jeffie Pollock 22 years ago   Constipation    Depressive disorder    Dyspepsia and other specified disorders of function of stomach    GERD (gastroesophageal reflux disease)    Hypoglycemia    Memory loss    Otitis    externa of right ear   Prostate cancer ([place])    Spinal stenosis    Unspecified pruritic disorder    Past Surgical History:  Procedure Laterality Date   PROSTATE SURGERY     Patient Active Problem List   Diagnosis Date Noted   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[place]) 03/23/2020   Diabetes mellitus screening 03/22/2020   Dysphagia 03/22/2020   Dyspnea on exertion 03/22/2020   High cholesterol 03/22/2020   Insomnia 03/22/2020   Lymphocytic colitis 03/22/2020   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[place]) 03/22/2020   Personal history of colonic polyps 03/22/2020   Spinal stenosis 03/22/2020   Dysfunction of right eustachian tube 12/30/2015   Sensorineural hearing loss 12/30/2015   Increased frequency of urination 01/09/2014   Acute, but ill-defined, cerebrovascular disease 12/27/2012   Occlusion and stenosis of carotid artery 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 cerebral infarction 12/05/2012   Aortic valve disorder 12/05/2012   Cancer ([place])    GERD (gastroesophageal reflux disease)    Hypoglycemia    Depressive disorder    Otitis     PCP:  Tamsen Meek   REFERRING PROVIDER: Lawerance Cruel, MD   REFERRING DIAG: M46.1 (ICD-10-CM) - Sacroiliitis, not elsewhere classified   Rationale for Evaluation and Treatment Rehabilitation  THERAPY DIAG:  Sacroiliitis ([place])  Muscle weakness (generalized)  Other symptoms and signs involving the musculoskeletal system  ONSET DATE: 06/27/2021   SUBJECTIVE:  SUBJECTIVE STATEMENT: Compliant with HEP, Some pain and stiffness R low back and hip.    PERTINENT HISTORY:  Aortic valve disorder, cerebrovascular disease, hearing loss, prostate CA with malignant neoplasm to lymph node  PAIN:  Are you having pain? Yes: NPRS scale: 2/10 Pain location: across hips in the back R>L Pain description: throb  Aggravating factors: lifting heavy (40#) Relieving factors: not lifting heavy but never goes away    PRECAUTIONS: None  WEIGHT BEARING RESTRICTIONS No  FALLS:  Has patient fallen in last 6 months? No  LIVING ENVIRONMENT: Lives with: lives with their spouse Lives in: House/apartment Stairs: Yes: Internal: 16 steps; on right going up and External: 2 steps; none Has following equipment at home: None  OCCUPATION: retired   PLOF: Independent, Independent with basic ADLs, Independent with gait, and Independent with transfers  PATIENT GOALS get rid of pain and not walk like an old man    OBJECTIVE:    MUSCLE LENGTH:  Eval Hamstrings severely tight B Piriformis severely tight B L LE about 1/4 inch longer than R LE   LUMBAR ROM:   Active  A/PROM  eval  Flexion WNL   Extension WNL but stiff feeling   Right lateral flexion WNL but pain on the R like a stabbing feeling    Left lateral flexion WNL but pain on the L   Right rotation   Left rotation    (Blank rows = not  tested)   LOWER EXTREMITY MMT:    MMT Right eval Left eval  Hip flexion 3+/5 3+/5  Hip extension 3-/5 3/5  Hip abduction 3/5 3/5  Hip adduction    Hip internal rotation    Hip external rotation    Knee flexion 4/5 4/5  Knee extension 5/5 5/5  Ankle dorsiflexion 5/5 5/5  Ankle plantarflexion    Ankle inversion    Ankle eversion     (Blank rows = not tested)    TODAY'S TREATMENT   07/25/21 NuStep L5 x6 min Resisted gait 40lb 4way x 3 each 6in step ups x10 each  Sit to stands 2x10  Leg curls 35lb 2x10  Leg Ext 15lb 2x10  Stretches 4x10''  Hamstring  Piriformis  K2C   Eval Nustep L4 BLEs x 6 minutes     Hamstring stretch 2x30 seconds B Piriformis stretch 2x30 seconds B  Bridges 1x10    PATIENT EDUCATION:  Education details: exam findings, POC, HEP  Person educated: Patient Education method: Customer service manager Education comprehension: verbalized understanding and returned demonstration   HOME EXERCISE PROGRAM: [id number]  ASSESSMENT:  CLINICAL IMPRESSION: Patient arrives feeling well with low pain/discomfort rating. Pt tolerated an initial progression to TE well evident by no subjective reports of increase pain. Some instability noted with resisted side steps. RLE weakness present with RLE step ups. Cue to control the eccentric phase of leg curls and extensions. Bilateral LE tightness with stretching   OBJECTIVE IMPAIRMENTS Abnormal gait, difficulty walking, decreased ROM, decreased strength, decreased safety awareness, hypomobility, increased fascial restrictions, increased muscle spasms, improper body mechanics, postural dysfunction, and pain.   ACTIVITY LIMITATIONS carrying, lifting, squatting, stairs, transfers, bed mobility, and locomotion level  PARTICIPATION LIMITATIONS: community activity and yard work  [employer], Past/current experiences, and Time since onset of injury/illness/exacerbation are also affecting patient's  functional outcome.   REHAB POTENTIAL: Good  CLINICAL DECISION MAKING: Stable/uncomplicated  EVALUATION COMPLEXITY: Low   GOALS: Goals reviewed with patient? No  SHORT TERM GOALS: Target date: 08/15/2021  Will  be compliant with HEP  Baseline: Goal status: INITIAL  2.  Will experience pain no more than 2/10  at worst  Baseline:  Goal status: INITIAL  3.  Will be able to stand and take steps without initial pain or catching Baseline:  Goal status: INITIAL   LONG TERM GOALS: Target date: 09/05/2021  MMT to improve by one grade in all weak groups  Baseline:  Goal status: INITIAL  2.  Will demonstrate good lifting mechanics from floor to waist height in order to prevent exacerbation of pain  Baseline:  Goal status: INITIAL  3.  LEFS score to improve by at least 15 points to show improvement in condition  Baseline:  Goal status: INITIAL  4.  Will be able to squat and do a flight of stairs without increase in pain  Baseline:  Goal status: INITIAL     PLAN: PT FREQUENCY: 2x/week  PT DURATION: 6 weeks  PLANNED INTERVENTIONS: Therapeutic exercises, Therapeutic activity, Neuromuscular re-education, Balance training, Gait training, Patient/Family education, Joint mobilization, Stair training, Dry Needling, Electrical stimulation, Spinal mobilization, Cryotherapy, Moist heat, scar mobilization, Taping, Traction, Ultrasound, Ionotophoresis '4mg'$ /ml Dexamethasone, Manual therapy, and Re-evaluation.  PLAN FOR NEXT SESSION: functional stretching and strengthening, dry needling?    Kristen U PT DPT PN2  07/25/2021, 11:04 AM

## 2021-07-27 ENCOUNTER — Encounter: Payer: Self-pay | Admitting: Physical Therapy

## 2021-07-27 ENCOUNTER — Ambulatory Visit: Payer: PPO | Admitting: Physical Therapy

## 2021-07-27 DIAGNOSIS — M6281 Muscle weakness (generalized): Secondary | ICD-10-CM

## 2021-07-27 DIAGNOSIS — R29898 Other symptoms and signs involving the musculoskeletal system: Secondary | ICD-10-CM

## 2021-07-27 DIAGNOSIS — M461 Sacroiliitis, not elsewhere classified: Secondary | ICD-10-CM | POA: Diagnosis not present

## 2021-07-27 NOTE — Therapy (Signed)
OUTPATIENT PHYSICAL THERAPY THORACOLUMBAR EVALUATION   Patient Name: Logan Wolfe MRN: [id number] DOB:1945/04/23, 76 y.o., male Today's Date: 07/27/2021   PT End of Session - 07/27/21 1059     Visit Number 3    Date for PT Re-Evaluation 08/29/21    PT Start Time 54    PT Stop Time 1145    PT Time Calculation (min) 45 min    Behavior During Therapy [facility] for tasks assessed/performed             Past Medical History:  Diagnosis Date   Allergic rhinitis, cause unspecified    Anxiety    Aortic valve calcification    Cancer ([place])    Prostate CA, Dr Jeffie Pollock 22 years ago   Constipation    Depressive disorder    Dyspepsia and other specified disorders of function of stomach    GERD (gastroesophageal reflux disease)    Hypoglycemia    Memory loss    Otitis    externa of right ear   Prostate cancer ([place])    Spinal stenosis    Unspecified pruritic disorder    Past Surgical History:  Procedure Laterality Date   PROSTATE SURGERY     Patient Active Problem List   Diagnosis Date Noted   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[place]) 03/23/2020   Diabetes mellitus screening 03/22/2020   Dysphagia 03/22/2020   Dyspnea on exertion 03/22/2020   High cholesterol 03/22/2020   Insomnia 03/22/2020   Lymphocytic colitis 03/22/2020   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[place]) 03/22/2020   Personal history of colonic polyps 03/22/2020   Spinal stenosis 03/22/2020   Dysfunction of right eustachian tube 12/30/2015   Sensorineural hearing loss 12/30/2015   Increased frequency of urination 01/09/2014   Acute, but ill-defined, cerebrovascular disease 12/27/2012   Occlusion and stenosis of carotid artery 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 cerebral infarction 12/05/2012   Aortic valve disorder 12/05/2012   Cancer ([place])    GERD (gastroesophageal reflux disease)    Hypoglycemia    Depressive disorder    Otitis     PCP: Tamsen Meek   REFERRING PROVIDER: Lawerance Cruel, MD   REFERRING DIAG: M46.1 (ICD-10-CM) - Sacroiliitis, not elsewhere classified   Rationale for Evaluation and Treatment Rehabilitation  THERAPY DIAG:  Sacroiliitis ([place])  Muscle weakness (generalized)  Other symptoms and signs involving the musculoskeletal system  ONSET DATE: 06/27/2021   SUBJECTIVE:  SUBJECTIVE STATEMENT: Its bothersome after he gets up and starts walking, eases up after a few steps.     PERTINENT HISTORY:  Aortic valve disorder, cerebrovascular disease, hearing loss, prostate CA with malignant neoplasm to lymph node  PAIN:  Are you having pain? Yes: NPRS scale: 2/10 Pain location: across hips in the back R>L Pain description: throb  Aggravating factors: lifting heavy (40#) Relieving factors: not lifting heavy but never goes away    PRECAUTIONS: None  WEIGHT BEARING RESTRICTIONS No  FALLS:  Has patient fallen in last 6 months? No  LIVING ENVIRONMENT: Lives with: lives with their spouse Lives in: House/apartment Stairs: Yes: Internal: 16 steps; on right going up and External: 2 steps; none Has following equipment at home: None  OCCUPATION: retired   PLOF: Independent, Independent with basic ADLs, Independent with gait, and Independent with transfers  PATIENT GOALS get rid of pain and not walk like an old man    OBJECTIVE:    MUSCLE LENGTH:  Eval Hamstrings severely tight B Piriformis severely tight B L LE about 1/4 inch longer than R LE   LUMBAR ROM:   Active  A/PROM  eval  Flexion WNL   Extension WNL but stiff feeling   Right lateral flexion WNL but pain on the R like a stabbing feeling    Left lateral flexion WNL but pain on the L   Right rotation   Left rotation    (Blank rows = not tested)   LOWER EXTREMITY MMT:    MMT  Right eval Left eval  Hip flexion 3+/5 3+/5  Hip extension 3-/5 3/5  Hip abduction 3/5 3/5  Hip adduction    Hip internal rotation    Hip external rotation    Knee flexion 4/5 4/5  Knee extension 5/5 5/5  Ankle dorsiflexion 5/5 5/5  Ankle plantarflexion    Ankle inversion    Ankle eversion     (Blank rows = not tested)    TODAY'S TREATMENT      07/27/21 NuStep L5 x 6 min  40lb side steps x5 each Lateral Step ups 6in x10 each  Hip Ext & abd 10lb x10 each  Leg curls 35lb 2x12 Leg Ext 15lb 2x12 Heel raises black bar 2x15 Ball Squeezes 2x10   07/25/21 NuStep L5 x6 min Resisted gait 40lb 4way x 3 each 6in step ups x10 each  Sit to stands 2x10  Leg curls 35lb 2x10  Leg Ext 15lb 2x10  Stretches 4x10''  Hamstring  Piriformis  K2C   Eval Nustep L4 BLEs x 6 minutes     Hamstring stretch 2x30 seconds B Piriformis stretch 2x30 seconds B  Bridges 1x10    PATIENT EDUCATION:  Education details: exam findings, POC, HEP  Person educated: Patient Education method: Customer service manager Education comprehension: verbalized understanding and returned demonstration   HOME EXERCISE PROGRAM: [id number]  ASSESSMENT:  CLINICAL IMPRESSION: Patient arrives feeling well. Pt able to progress to more isolated hip interventions. Bilateral hip weakness present with standing hip abd and ext. R hip weakness also noted with lateral step ups. Increase reps tolerated with leg curls and extensions.    OBJECTIVE IMPAIRMENTS Abnormal gait, difficulty walking, decreased ROM, decreased strength, decreased safety awareness, hypomobility, increased fascial restrictions, increased muscle spasms, improper body mechanics, postural dysfunction, and pain.   ACTIVITY LIMITATIONS carrying, lifting, squatting, stairs, transfers, bed mobility, and locomotion level  PARTICIPATION LIMITATIONS: community activity and yard work  [place], Past/current experiences, and Time since  onset  of injury/illness/exacerbation are also affecting patient's functional outcome.   REHAB POTENTIAL: Good  CLINICAL DECISION MAKING: Stable/uncomplicated  EVALUATION COMPLEXITY: Low   GOALS: Goals reviewed with patient? No  SHORT TERM GOALS: Target date: 08/17/2021  Will be compliant with HEP  Baseline: Goal status: INITIAL  2.  Will experience pain no more than 2/10  at worst  Baseline:  Goal status: INITIAL  3.  Will be able to stand and take steps without initial pain or catching Baseline:  Goal status: INITIAL   LONG TERM GOALS: Target date: 09/07/2021  MMT to improve by one grade in all weak groups  Baseline:  Goal status: INITIAL  2.  Will demonstrate good lifting mechanics from floor to waist height in order to prevent exacerbation of pain  Baseline:  Goal status: INITIAL  3.  LEFS score to improve by at least 15 points to show improvement in condition  Baseline:  Goal status: INITIAL  4.  Will be able to squat and do a flight of stairs without increase in pain  Baseline:  Goal status: INITIAL     PLAN: PT FREQUENCY: 2x/week  PT DURATION: 6 weeks  PLANNED INTERVENTIONS: Therapeutic exercises, Therapeutic activity, Neuromuscular re-education, Balance training, Gait training, Patient/Family education, Joint mobilization, Stair training, Dry Needling, Electrical stimulation, Spinal mobilization, Cryotherapy, Moist heat, scar mobilization, Taping, Traction, Ultrasound, Ionotophoresis '4mg'$ /ml Dexamethasone, Manual therapy, and Re-evaluation.  PLAN FOR NEXT SESSION: functional stretching and strengthening, dry needling?    Kristen U PT DPT PN2  07/27/2021, 11:00 AM

## 2021-08-01 ENCOUNTER — Ambulatory Visit: Payer: PPO | Admitting: Physical Therapy

## 2021-08-01 ENCOUNTER — Encounter: Payer: Self-pay | Admitting: Physical Therapy

## 2021-08-01 DIAGNOSIS — M461 Sacroiliitis, not elsewhere classified: Secondary | ICD-10-CM

## 2021-08-01 DIAGNOSIS — M6281 Muscle weakness (generalized): Secondary | ICD-10-CM

## 2021-08-01 DIAGNOSIS — R29898 Other symptoms and signs involving the musculoskeletal system: Secondary | ICD-10-CM

## 2021-08-01 NOTE — Therapy (Signed)
OUTPATIENT PHYSICAL THERAPY THORACOLUMBAR EVALUATION   Patient Name: Logan Wolfe MRN: [id number] DOB:1945-05-10, 76 y.o., male Today's Date: 08/01/2021   PT End of Session - 08/01/21 1058     Visit Number 4    Number of Visits 13    Date for PT Re-Evaluation 08/29/21    Authorization Type HTA    PT Start Time 1018    PT Stop Time 1058    PT Time Calculation (min) 40 min    Activity Tolerance Patient tolerated treatment well    Behavior During Therapy [facility] for tasks assessed/performed              Past Medical History:  Diagnosis Date   Allergic rhinitis, cause unspecified    Anxiety    Aortic valve calcification    Cancer ([place])    Prostate CA, Dr Jeffie Pollock 22 years ago   Constipation    Depressive disorder    Dyspepsia and other specified disorders of function of stomach    GERD (gastroesophageal reflux disease)    Hypoglycemia    Memory loss    Otitis    externa of right ear   Prostate cancer ([place])    Spinal stenosis    Unspecified pruritic disorder    Past Surgical History:  Procedure Laterality Date   PROSTATE SURGERY     Patient Active Problem List   Diagnosis Date Noted   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[place]) 03/23/2020   Diabetes mellitus screening 03/22/2020   Dysphagia 03/22/2020   Dyspnea on exertion 03/22/2020   High cholesterol 03/22/2020   Insomnia 03/22/2020   Lymphocytic colitis 03/22/2020   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[place]) 03/22/2020   Personal history of colonic polyps 03/22/2020   Spinal stenosis 03/22/2020   Dysfunction of right eustachian tube 12/30/2015   Sensorineural hearing loss 12/30/2015   Increased frequency of urination 01/09/2014   Acute, but ill-defined, cerebrovascular disease 12/27/2012   Occlusion and stenosis of carotid artery 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 When I have a bad day my hip is hurting pretty badly.   PERTINENT HISTORY:  Aortic valve disorder, cerebrovascular disease, hearing loss, prostate CA with malignant neoplasm to lymph node  PAIN:  Are you having pain? Yes: NPRS scale: 2-3/10 Pain location: R hip  Pain description: throbbing but "difficult to describe"  Aggravating factors: lifting heavy (40#) Relieving factors: not lifting heavy but never goes away    PRECAUTIONS: None  WEIGHT BEARING RESTRICTIONS No  FALLS:  Has patient fallen in last 6 months? No  LIVING ENVIRONMENT: Lives with: lives with their spouse Lives in: House/apartment Stairs: Yes: Internal: 16 steps; on right going up and External: 2 steps; none Has following equipment at home: None  OCCUPATION: retired   PLOF: Independent, Independent with basic ADLs, Independent with gait, and Independent with transfers  PATIENT GOALS get rid of pain and not walk like an old man       TODAY'S TREATMENT   08/01/21  Nustep L5 x6 minutes   RLE slightly longer than the L- maybe 1/8 inch  SI MET correction for LLD pain reduced to 0/10  MMT hip flexion 3+ B, R hip ABD 3+, L hip ABD 3/5   Bridge with green TB 1x10  Sidelying clams 1x10 B  Prone hip extensions knee bent 1x10 B Hip flexor  stretches 2x60 seconds R LE  Isometric hip extensions 1x10 3 second holds L LE only  Hip hikes 1x10 B        07/27/21 NuStep L5 x 6 min  40lb side steps x5 each Lateral Step ups 6in x10 each  Hip Ext & abd 10lb x10 each  Leg curls 35lb 2x12 Leg Ext 15lb 2x12 Heel raises black bar 2x15 Ball Squeezes 2x10   07/25/21 NuStep L5 x6 min Resisted gait 40lb 4way x 3 each 6in step ups x10 each  Sit to stands 2x10  Leg curls 35lb 2x10  Leg Ext 15lb 2x10  Stretches 4x10''  Hamstring  Piriformis  K2C     PATIENT EDUCATION:  Education details: exercise form and purpose, self-SI MET techniques  Person educated: Patient Education method: Customer service manager Education comprehension: verbalized understanding and returned demonstration   HOME EXERCISE PROGRAM: [id number]  ASSESSMENT:  CLINICAL IMPRESSION:  [name] arrives today doing OK, having bad and good days. Saturday was the worst day he's had yet due to R hip pain. Warmed up on the Nustep then checked SI alignment, otherwise continued working on functional strengthening and flexibility as appropriate. Very vague in describing pain and symptoms today, but we had a lot of success with SI MET techniques in reducing pain. Also got lots of cramps in weak mm groups. Will continue efforts.   OBJECTIVE IMPAIRMENTS Abnormal gait, difficulty walking, decreased ROM, decreased strength, decreased safety awareness, hypomobility, increased fascial restrictions, increased muscle spasms, improper body mechanics, postural dysfunction, and pain.   ACTIVITY LIMITATIONS carrying, lifting, squatting, stairs, transfers, bed mobility, and locomotion level  PARTICIPATION LIMITATIONS: community activity and yard work  [place], Past/current experiences, and Time since onset of injury/illness/exacerbation are also affecting patient's functional outcome.   REHAB POTENTIAL: Good  CLINICAL DECISION MAKING:  Stable/uncomplicated  EVALUATION COMPLEXITY: Low   GOALS: Goals reviewed with patient? No  SHORT TERM GOALS: Target date: 08/22/2021  Will be compliant with HEP  Baseline: Goal status: MET  2.  Will experience pain no more than 2/10  at worst  Baseline: "I don't know how  to answer this"  Goal status: IN PROGRESS  3.  Will be able to stand and take steps without initial pain or catching Baseline: 6/19- still having a hard time with steps  Goal status: IN PROGRESS   LONG TERM GOALS: Target date: 09/12/2021  MMT to improve by one grade in all weak groups  Baseline:  Goal status: INITIAL  2.  Will demonstrate good lifting mechanics from floor to waist height in order to prevent exacerbation of pain  Baseline:  Goal status: INITIAL  3.  LEFS score to improve by at least 15 points to show improvement in condition  Baseline:  Goal status: INITIAL  4.  Will be able to squat and do a flight of stairs without increase in pain  Baseline:  Goal status: INITIAL     PLAN: PT FREQUENCY: 2x/week  PT DURATION: 6 weeks  PLANNED INTERVENTIONS: Therapeutic exercises, Therapeutic activity, Neuromuscular re-education, Balance training, Gait training, Patient/Family education, Joint mobilization, Stair training, Dry Needling, Electrical stimulation, Spinal mobilization, Cryotherapy, Moist heat, scar mobilization, Taping, Traction, Ultrasound, Ionotophoresis 39m/ml Dexamethasone, Manual therapy, and Re-evaluation.  PLAN FOR NEXT SESSION: functional stretching and strengthening, f/u on SI MET and corrections    Dannika Hilgeman U PT DPT PN2  08/01/2021, 10:58 AM

## 2021-08-03 ENCOUNTER — Encounter: Payer: Self-pay | Admitting: Physical Therapy

## 2021-08-03 ENCOUNTER — Ambulatory Visit: Payer: PPO | Admitting: Physical Therapy

## 2021-08-03 DIAGNOSIS — R29898 Other symptoms and signs involving the musculoskeletal system: Secondary | ICD-10-CM

## 2021-08-03 DIAGNOSIS — R293 Abnormal posture: Secondary | ICD-10-CM

## 2021-08-03 DIAGNOSIS — M461 Sacroiliitis, not elsewhere classified: Secondary | ICD-10-CM

## 2021-08-03 DIAGNOSIS — M6281 Muscle weakness (generalized): Secondary | ICD-10-CM

## 2021-08-03 NOTE — Therapy (Signed)
OUTPATIENT PHYSICAL THERAPY THORACOLUMBAR EVALUATION   Patient Name: Logan Wolfe MRN: [id number] DOB:[birth year], 76 y.o., male Today's Date: 08/03/2021   PT End of Session - 08/03/21 1059     Visit Number 5    Date for PT Re-Evaluation 08/29/21    PT Start Time 74    PT Stop Time 1145    PT Time Calculation (min) 45 min    Activity Tolerance Patient tolerated treatment well    Behavior During Therapy [facility] for tasks assessed/performed              Past Medical History:  Diagnosis Date   Allergic rhinitis, cause unspecified    Anxiety    Aortic valve calcification    Cancer ([place])    Prostate CA, Dr Jeffie Pollock 22 years ago   Constipation    Depressive disorder    Dyspepsia and other specified disorders of function of stomach    GERD (gastroesophageal reflux disease)    Hypoglycemia    Memory loss    Otitis    externa of right ear   Prostate cancer ([place])    Spinal stenosis    Unspecified pruritic disorder    Past Surgical History:  Procedure Laterality Date   PROSTATE SURGERY     Patient Active Problem List   Diagnosis Date Noted   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[place]) 03/23/2020   Diabetes mellitus screening 03/22/2020   Dysphagia 03/22/2020   Dyspnea on exertion 03/22/2020   High cholesterol 03/22/2020   Insomnia 03/22/2020   Lymphocytic colitis 03/22/2020   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[place]) 03/22/2020   Personal history of colonic polyps 03/22/2020   Spinal stenosis 03/22/2020   Dysfunction of right eustachian tube 12/30/2015   Sensorineural hearing loss 12/30/2015   Increased frequency of urination 01/09/2014   Acute, but ill-defined, cerebrovascular disease 12/27/2012   Occlusion and stenosis of carotid artery 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 cerebral infarction 12/05/2012   Aortic valve disorder 12/05/2012   Cancer ([place])    GERD (gastroesophageal reflux disease)    Hypoglycemia    Depressive disorder    Otitis      PCP: Tamsen Meek   REFERRING PROVIDER: Lawerance Cruel, MD   REFERRING DIAG: M46.1 (ICD-10-CM) - Sacroiliitis, not elsewhere classified   Rationale for Evaluation and Treatment Rehabilitation  THERAPY DIAG:  Sacroiliitis ([place])  Muscle weakness (generalized)  Other symptoms and signs involving the musculoskeletal system  Abnormal posture  ONSET DATE: 06/27/2021   SUBJECTIVE:  SUBJECTIVE STATEMENT: Pt states he is feeling okay, says he slept poorly last night due to being up all night searching for his lost cat.  He reports moe hip pain from looking under things in search of his cat          PERTINENT HISTORY:  Aortic valve disorder, cerebrovascular disease, hearing loss, prostate CA with malignant neoplasm to lymph node  PAIN:  Are you having pain? Yes: NPRS scale: 2-3/10 Pain location: R hip  Pain description: throbbing but "difficult to describe"  Aggravating factors: lifting heavy (40#) Relieving factors: not lifting heavy but never goes away    PRECAUTIONS: None  WEIGHT BEARING RESTRICTIONS No  FALLS:  Has patient fallen in last 6 months? No  LIVING ENVIRONMENT: Lives with: lives with their spouse Lives in: House/apartment Stairs: Yes: Internal: 16 steps; on right going up and External: 2 steps; none Has following equipment at home: None  OCCUPATION: retired   PLOF: Independent, Independent with basic ADLs, Independent with gait, and Independent with transfers  PATIENT GOALS get rid of pain and not walk like an old man       TODAY'S TREATMENT   08/03/21  Nustep L5 x 6 mins SI MET correction for LLD pain reduced to 0/10 Hooklying ball abd 2x10 Hamstring stretches 5x10'' Piriformis stretch 3x15'' SLR 2x10 Hip Abd x10  Hooklying marches 2x10 Prone hip Ext  x10 Sit to stand x15 x10 Seated hip abduction with blue TB 2x10 Step ups 6in 2x10  08/01/21  Nustep L5 x6 minutes   RLE slightly longer than the L- maybe 1/8 inch  SI MET correction for LLD pain reduced to 0/10  MMT hip flexion 3+ B, R hip ABD 3+, L hip ABD 3/5   Bridge with green TB 1x10  Sidelying clams 1x10 B  Prone hip extensions knee bent 1x10 B Hip flexor stretches 2x60 seconds R LE  Isometric hip extensions 1x10 3 second holds L LE only  Hip hikes 1x10 B        07/27/21 NuStep L5 x 6 min  40lb side steps x5 each Lateral Step ups 6in x10 each  Hip Ext & abd 10lb x10 each  Leg curls 35lb 2x12 Leg Ext 15lb 2x12 Heel raises black bar 2x15 Ball Squeezes 2x10   07/25/21 NuStep L5 x6 min Resisted gait 40lb 4way x 3 each 6in step ups x10 each  Sit to stands 2x10  Leg curls 35lb 2x10  Leg Ext 15lb 2x10  Stretches 4x10''  Hamstring  Piriformis  K2C     PATIENT EDUCATION:  Education details: exercise form and purpose, self-SI MET techniques  Person educated: Patient Education method: Customer service manager Education comprehension: verbalized understanding and returned demonstration   HOME EXERCISE PROGRAM: [id number]  ASSESSMENT:  CLINICAL IMPRESSION:  [name] arrives with reports of Hip pain form looking for his cat all night. Continues with SI MET with some relief. Constant L HS cramping reported with hooklying interventions. Come hp fatigue noted with supine abduction. Bilateral HS tightness present with passive stretching. No issues with sit to stands and step ups.   OBJECTIVE IMPAIRMENTS Abnormal gait, difficulty walking, decreased ROM, decreased strength, decreased safety awareness, hypomobility, increased fascial restrictions, increased muscle spasms, improper body mechanics, postural dysfunction, and pain.   ACTIVITY LIMITATIONS carrying, lifting, squatting, stairs, transfers, bed mobility, and locomotion level  PARTICIPATION LIMITATIONS:  community activity and yard work  [place], Past/current experiences, and Time since onset of injury/illness/exacerbation are also affecting patient's functional outcome.  REHAB POTENTIAL: Good  CLINICAL DECISION MAKING: Stable/uncomplicated  EVALUATION COMPLEXITY: Low   GOALS: Goals reviewed with patient? No  SHORT TERM GOALS: Target date: 08/24/2021  Will be compliant with HEP  Baseline: Goal status: MET  2.  Will experience pain no more than 2/10  at worst  Baseline: "I don't know how to answer this"  Goal status: IN PROGRESS  3.  Will be able to stand and take steps without initial pain or catching Baseline: 6/19- still having a hard time with steps  Goal status: IN PROGRESS   LONG TERM GOALS: Target date: 09/14/2021  MMT to improve by one grade in all weak groups  Baseline:  Goal status: INITIAL  2.  Will demonstrate good lifting mechanics from floor to waist height in order to prevent exacerbation of pain  Baseline:  Goal status: INITIAL  3.  LEFS score to improve by at least 15 points to show improvement in condition  Baseline:  Goal status: INITIAL  4.  Will be able to squat and do a flight of stairs without increase in pain  Baseline:  Goal status: Progressing      PLAN: PT FREQUENCY: 2x/week  PT DURATION: 6 weeks  PLANNED INTERVENTIONS: Therapeutic exercises, Therapeutic activity, Neuromuscular re-education, Balance training, Gait training, Patient/Family education, Joint mobilization, Stair training, Dry Needling, Electrical stimulation, Spinal mobilization, Cryotherapy, Moist heat, scar mobilization, Taping, Traction, Ultrasound, Ionotophoresis 19m/ml Dexamethasone, Manual therapy, and Re-evaluation.  PLAN FOR NEXT SESSION: functional stretching and strengthening, f/u on SI MET and corrections    Kristen U PT DPT PN2  08/03/2021, 11:00 AM

## 2021-08-08 ENCOUNTER — Ambulatory Visit: Payer: PPO | Admitting: Physical Therapy

## 2021-08-08 DIAGNOSIS — R293 Abnormal posture: Secondary | ICD-10-CM

## 2021-08-08 DIAGNOSIS — M461 Sacroiliitis, not elsewhere classified: Secondary | ICD-10-CM | POA: Diagnosis not present

## 2021-08-08 DIAGNOSIS — M6281 Muscle weakness (generalized): Secondary | ICD-10-CM

## 2021-08-08 DIAGNOSIS — R29898 Other symptoms and signs involving the musculoskeletal system: Secondary | ICD-10-CM

## 2021-08-10 ENCOUNTER — Ambulatory Visit: Payer: PPO | Admitting: Physical Therapy

## 2021-08-10 ENCOUNTER — Encounter: Payer: Self-pay | Admitting: Physical Therapy

## 2021-08-10 DIAGNOSIS — R293 Abnormal posture: Secondary | ICD-10-CM

## 2021-08-10 DIAGNOSIS — M6281 Muscle weakness (generalized): Secondary | ICD-10-CM

## 2021-08-10 DIAGNOSIS — M461 Sacroiliitis, not elsewhere classified: Secondary | ICD-10-CM | POA: Diagnosis not present

## 2021-08-10 NOTE — Therapy (Signed)
OUTPATIENT PHYSICAL THERAPY THORACOLUMBAR EVALUATION   Patient Name: Logan Wolfe MRN: [id number] DOB:1945/09/25, 76 y.o., male Today's Date: 08/10/2021   PT End of Session - 08/10/21 1101     Visit Number 7    Date for PT Re-Evaluation 08/29/21    PT Start Time 30    PT Stop Time 1145    PT Time Calculation (min) 45 min    Activity Tolerance Patient tolerated treatment well    Behavior During Therapy [facility] for tasks assessed/performed              Past Medical History:  Diagnosis Date   Allergic rhinitis, cause unspecified    Anxiety    Aortic valve calcification    Cancer ([place])    Prostate CA, Dr Jeffie Pollock 22 years ago   Constipation    Depressive disorder    Dyspepsia and other specified disorders of function of stomach    GERD (gastroesophageal reflux disease)    Hypoglycemia    Memory loss    Otitis    externa of right ear   Prostate cancer ([place])    Spinal stenosis    Unspecified pruritic disorder    Past Surgical History:  Procedure Laterality Date   PROSTATE SURGERY     Patient Active Problem List   Diagnosis Date Noted   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[place]) 03/23/2020   Diabetes mellitus screening 03/22/2020   Dysphagia 03/22/2020   Dyspnea on exertion 03/22/2020   High cholesterol 03/22/2020   Insomnia 03/22/2020   Lymphocytic colitis 03/22/2020   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[place]) 03/22/2020   Personal history of colonic polyps 03/22/2020   Spinal stenosis 03/22/2020   Dysfunction of right eustachian tube 12/30/2015   Sensorineural hearing loss 12/30/2015   Increased frequency of urination 01/09/2014   Acute, but ill-defined, cerebrovascular disease 12/27/2012   Occlusion and stenosis of carotid artery 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 cerebral infarction 12/05/2012   Aortic valve disorder 12/05/2012   Cancer ([place])    GERD (gastroesophageal reflux disease)    Hypoglycemia    Depressive disorder    Otitis      PCP: Tamsen Meek   REFERRING PROVIDER: Lawerance Cruel, MD   REFERRING DIAG: M46.1 (ICD-10-CM) - Sacroiliitis, not elsewhere classified   Rationale for Evaluation and Treatment Rehabilitation  THERAPY DIAG:  Sacroiliitis ([place])  Muscle weakness (generalized)  Abnormal posture  ONSET DATE: 06/27/2021   SUBJECTIVE:  SUBJECTIVE STATEMENT: I've been sleeping better and my pain is a lot better today.   PERTINENT HISTORY:  Aortic valve disorder, cerebrovascular disease, hearing loss, prostate CA with malignant neoplasm to lymph node  PAIN:  Are you having pain? Yes: NPRS scale: 1/10 Pain location: R hip  Pain description: throbbing but "difficult to describe"  Aggravating factors: lifting heavy (40#) Relieving factors: not lifting heavy but never goes away    PRECAUTIONS: None  WEIGHT BEARING RESTRICTIONS No  FALLS:  Has patient fallen in last 6 months? No  LIVING ENVIRONMENT: Lives with: lives with their spouse Lives in: House/apartment Stairs: Yes: Internal: 16 steps; on right going up and External: 2 steps; none Has following equipment at home: None  OCCUPATION: retired   PLOF: Independent, Independent with basic ADLs, Independent with gait, and Independent with transfers  PATIENT GOALS get rid of pain and not walk like an old man       TODAY'S TREATMENT  08/10/21 NuStep L5 x 6 min Ambulate around building in grass and uneven surface x2 S2S with 8# 2x15 Hamstring curls 45# 2x12 Leg extension 20# 2x12 Resisted gait 4 way 40# x5 each, one episode of LOB when sidestepping to left 6" step ups from airex 2x15 Balance on BOSU with bilateral handrails for support 4x30". Required less support as trials progressed Step ups on BOSU 2x15 Demonstrated piriformis stretch to  do at home  08/08/21 NuStep L5 x 6 min Resisted gait 40# 2x5 all directions  Ascend/descend stairs x 5 with R side handrail Ambulate around building in grass and uneven surface x 2 Tandem balance in parallel bars on airex x 5 S2S with 8# 2x15 6" Step ups from airex x 15  08/03/21  Nustep L5 x 6 mins SI MET correction for LLD pain reduced to 0/10 Hooklying ball abd 2x10 Hamstring stretches 5x10'' Piriformis stretch 3x15'' SLR 2x10 Hip Abd x10  Hooklying marches 2x10 Prone hip Ext x10 Sit to stand x15 x10 Seated hip abduction with blue TB 2x10 Step ups 6in 2x10  08/01/21  Nustep L5 x6 minutes   RLE slightly longer than the L- maybe 1/8 inch  SI MET correction for LLD pain reduced to 0/10  MMT hip flexion 3+ B, R hip ABD 3+, L hip ABD 3/5   Bridge with green TB 1x10  Sidelying clams 1x10 B  Prone hip extensions knee bent 1x10 B Hip flexor stretches 2x60 seconds R LE  Isometric hip extensions 1x10 3 second holds L LE only  Hip hikes 1x10 B        07/27/21 NuStep L5 x 6 min  40lb side steps x5 each Lateral Step ups 6in x10 each  Hip Ext & abd 10lb x10 each  Leg curls 35lb 2x12 Leg Ext 15lb 2x12 Heel raises black bar 2x15 Ball Squeezes 2x10   07/25/21 NuStep L5 x6 min Resisted gait 40lb 4way x 3 each 6in step ups x10 each  Sit to stands 2x10  Leg curls 35lb 2x10  Leg Ext 15lb 2x10  Stretches 4x10''  Hamstring  Piriformis  K2C     PATIENT EDUCATION:  Education details: exercise form and purpose, self-SI MET techniques  Person educated: Patient Education method: Customer service manager Education comprehension: verbalized understanding and returned demonstration   HOME EXERCISE PROGRAM: [id number]  ASSESSMENT:  CLINICAL IMPRESSION:    Pt doing well with strengthening exercises and continues to show initiative and motivation to improve. Did have one episode of LOB when performing resisted gait. Progressing  well with balance training and finds  BOSU challenging, with occasional min assistance needed to recover from LOB.   OBJECTIVE IMPAIRMENTS Abnormal gait, difficulty walking, decreased ROM, decreased strength, decreased safety awareness, hypomobility, increased fascial restrictions, increased muscle spasms, improper body mechanics, postural dysfunction, and pain.   ACTIVITY LIMITATIONS carrying, lifting, squatting, stairs, transfers, bed mobility, and locomotion level  PARTICIPATION LIMITATIONS: community activity and yard work  [place], Past/current experiences, and Time since onset of injury/illness/exacerbation are also affecting patient's functional outcome.   REHAB POTENTIAL: Good  CLINICAL DECISION MAKING: Stable/uncomplicated  EVALUATION COMPLEXITY: Low   GOALS: Goals reviewed with patient? No  SHORT TERM GOALS: Target date: 08/31/2021  Will be compliant with HEP  Baseline: Goal status: MET  2.  Will experience pain no more than 2/10  at worst  Baseline: "I don't know how to answer this"  Goal status: IN PROGRESS  3.  Will be able to stand and take steps without initial pain or catching Baseline: 6/19- still having a hard time with steps  Goal status: IN PROGRESS   LONG TERM GOALS: Target date: 09/21/2021  MMT to improve by one grade in all weak groups  Baseline:  Goal status: INITIAL  2.  Will demonstrate good lifting mechanics from floor to waist height in order to prevent exacerbation of pain  Baseline:  Goal status: INITIAL  3.  LEFS score to improve by at least 15 points to show improvement in condition  Baseline:  Goal status: INITIAL  4.  Will be able to squat and do a flight of stairs without increase in pain  Baseline:  Goal status: Progressing      PLAN: PT FREQUENCY: 2x/week  PT DURATION: 6 weeks  PLANNED INTERVENTIONS: Therapeutic exercises, Therapeutic activity, Neuromuscular re-education, Balance training, Gait training, Patient/Family education, Joint  mobilization, Stair training, Dry Needling, Electrical stimulation, Spinal mobilization, Cryotherapy, Moist heat, scar mobilization, Taping, Traction, Ultrasound, Ionotophoresis 29m/ml Dexamethasone, Manual therapy, and Re-evaluation.  PLAN FOR NEXT SESSION: Functional training, balance training and strengthening. Stretching if needed.    [place] SPTA 08/10/2021, 11:02 AM

## 2021-08-15 ENCOUNTER — Encounter: Payer: Self-pay | Admitting: Physical Therapy

## 2021-08-15 ENCOUNTER — Ambulatory Visit: Payer: PPO | Attending: Family Medicine | Admitting: Physical Therapy

## 2021-08-15 DIAGNOSIS — R29898 Other symptoms and signs involving the musculoskeletal system: Secondary | ICD-10-CM | POA: Diagnosis not present

## 2021-08-15 DIAGNOSIS — M6281 Muscle weakness (generalized): Secondary | ICD-10-CM | POA: Insufficient documentation

## 2021-08-15 DIAGNOSIS — M461 Sacroiliitis, not elsewhere classified: Secondary | ICD-10-CM | POA: Diagnosis not present

## 2021-08-15 DIAGNOSIS — R293 Abnormal posture: Secondary | ICD-10-CM | POA: Diagnosis not present

## 2021-08-15 NOTE — Therapy (Signed)
OUTPATIENT PHYSICAL THERAPY THORACOLUMBAR EVALUATION   Patient Name: Logan Wolfe MRN: [id number] DOB:Nov 02, 1945, 76 y.o., male Today's Date: 08/15/2021   PT End of Session - 08/15/21 1051     Visit Number 8    Date for PT Re-Evaluation 08/29/21    PT Start Time 1055    PT Stop Time 1140    PT Time Calculation (min) 45 min    Activity Tolerance Patient tolerated treatment well    Behavior During Therapy [facility]FL for tasks assessed/performed              Past Medical History:  Diagnosis Date   Allergic rhinitis, cause unspecified    Anxiety    Aortic valve calcification    Cancer ([place])    Prostate CA, Dr Jeffie Pollock 22 years ago   Constipation    Depressive disorder    Dyspepsia and other specified disorders of function of stomach    GERD (gastroesophageal reflux disease)    Hypoglycemia    Memory loss    Otitis    externa of right ear   Prostate cancer ([place])    Spinal stenosis    Unspecified pruritic disorder    Past Surgical History:  Procedure Laterality Date   PROSTATE SURGERY     Patient Active Problem List   Diagnosis Date Noted   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[place]) 03/23/2020   Diabetes mellitus screening 03/22/2020   Dysphagia 03/22/2020   Dyspnea on exertion 03/22/2020   High cholesterol 03/22/2020   Insomnia 03/22/2020   Lymphocytic colitis 03/22/2020   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[place]) 03/22/2020   Personal history of colonic polyps 03/22/2020   Spinal stenosis 03/22/2020   Dysfunction of right eustachian tube 12/30/2015   Sensorineural hearing loss 12/30/2015   Increased frequency of urination 01/09/2014   Acute, but ill-defined, cerebrovascular disease 12/27/2012   Occlusion and stenosis of carotid artery 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 cerebral infarction 12/05/2012   Aortic valve disorder 12/05/2012   Cancer ([place])    GERD (gastroesophageal reflux disease)    Hypoglycemia    Depressive disorder    Otitis      PCP: Tamsen Meek   REFERRING PROVIDER: Lawerance Cruel, MD   REFERRING DIAG: M46.1 (ICD-10-CM) - Sacroiliitis, not elsewhere classified   Rationale for Evaluation and Treatment Rehabilitation  THERAPY DIAG:  Sacroiliitis ([place])  Muscle weakness (generalized)  Abnormal posture  Other symptoms and signs involving the musculoskeletal system  ONSET DATE: 06/27/2021   SUBJECTIVE:  SUBJECTIVE STATEMENT: This hasn't been a good weekend. I'm hurting in my hip.   PERTINENT HISTORY:  Aortic valve disorder, cerebrovascular disease, hearing loss, prostate CA with malignant neoplasm to lymph node  PAIN:  Are you having pain? Yes: NPRS scale: 2/10 Pain location: R hip  Pain description: throbbing but "difficult to describe"  Aggravating factors: lifting heavy (40#) Relieving factors: not lifting heavy but never goes away    PRECAUTIONS: None  WEIGHT BEARING RESTRICTIONS No  FALLS:  Has patient fallen in last 6 months? No  LIVING ENVIRONMENT: Lives with: lives with their spouse Lives in: House/apartment Stairs: Yes: Internal: 16 steps; on right going up and External: 2 steps; none Has following equipment at home: None  OCCUPATION: retired   PLOF: Independent, Independent with basic ADLs, Independent with gait, and Independent with transfers  PATIENT GOALS get rid of pain and not walk like an old man       TODAY'S TREATMENT  08/15/21 NuStep L5 x 6 min Ambulate around back and front building and ascend/descend two flights of stairs Squat with 25lb box and place on ground x2, pain increased from 2/10 to 4/10 in R hip. Good form.  Resisted gait 4 way 40# x5 each  Leg extension 25# 2x10 Hamstring curl 45# 2x12 Step ups on BOSU 2x10 B, using bilateral handrail for support Balance  on BOSU with 2x30" without any handrail Hamstring/piriformis stretch 2x15" B, R>L  08/10/21 NuStep L5 x 6 min Ambulate around building in grass and uneven surface x2 S2S with 8# 2x15 Hamstring curls 45# 2x12 Leg extension 20# 2x12 Resisted gait 4 way 40# x5 each, one episode of LOB when sidestepping to left 6" step ups from airex 2x15 Balance on BOSU with bilateral handrails for support 4x30". Required less support as trials progressed Step ups on BOSU 2x15 Demonstrated piriformis stretch to do at home  08/08/21 NuStep L5 x 6 min Resisted gait 40# 2x5 all directions  Ascend/descend stairs x 5 with R side handrail Ambulate around building in grass and uneven surface x 2 Tandem balance in parallel bars on airex x 5 S2S with 8# 2x15 6" Step ups from airex x 15  08/03/21 Nustep L5 x 6 mins SI MET correction for LLD pain reduced to 0/10 Hooklying ball abd 2x10 Hamstring stretches 5x10'' Piriformis stretch 3x15'' SLR 2x10 Hip Abd x10  Hooklying marches 2x10 Prone hip Ext x10 Sit to stand x15 x10 Seated hip abduction with blue TB 2x10 Step ups 6in 2x10  08/01/21 Nustep L5 x6 minutes  RLE slightly longer than the L- maybe 1/8 inch  SI MET correction for LLD pain reduced to 0/10 MMT hip flexion 3+ B, R hip ABD 3+, L hip ABD 3/5 Bridge with green TB 1x10  Sidelying clams 1x10 B  Prone hip extensions knee bent 1x10 B Hip flexor stretches 2x60 seconds R LE  Isometric hip extensions 1x10 3 second holds L LE only  Hip hikes 1x10 B   07/27/21  NuStep L5 x 6 min   40lb side steps x5 each Lateral Step ups 6in x10 each  Hip Ext & abd 10lb x10 each  Leg curls 35lb 2x12 Leg Ext 15lb 2x12 Heel raises black bar 2x15 Ball Squeezes 2x10   07/25/21 NuStep L5 x6 min Resisted gait 40lb 4way x 3 each 6in step ups x10 each  Sit to stands 2x10  Leg curls 35lb 2x10  Leg Ext 15lb 2x10  Stretches 4x10''  Hamstring  Piriformis  K2C  PATIENT EDUCATION:  Education details:  exercise form and purpose, self-SI MET techniques  Person educated: Patient Education method: Customer service manager Education comprehension: verbalized understanding and returned demonstration   HOME EXERCISE PROGRAM: [id number]  ASSESSMENT:  CLINICAL IMPRESSION:    Pt enters today feeling okay. Pt states he has noticed over the last two weeks that he has had difficulty putting his pants on when maintaining SLS on right leg. Pt participated very well with stair negotiation and squats while lifting, however states his pain in his right hip doubles when doing these activities. Pt states he is unsure if he has had any improvement in pain from therapy sessions. Pt states he would follow up with his doctor about the constant pain. Pt has shown improvement in lower extremity strengthening and would benefit from additional strengthening, balance and functional activities. Did some stretching today due to tightness in right hip and explained importance of stretching at home.   OBJECTIVE IMPAIRMENTS Abnormal gait, difficulty walking, decreased ROM, decreased strength, decreased safety awareness, hypomobility, increased fascial restrictions, increased muscle spasms, improper body mechanics, postural dysfunction, and pain.   ACTIVITY LIMITATIONS carrying, lifting, squatting, stairs, transfers, bed mobility, and locomotion level  PARTICIPATION LIMITATIONS: community activity and yard work  [place], Past/current experiences, and Time since onset of injury/illness/exacerbation are also affecting patient's functional outcome.   REHAB POTENTIAL: Good  CLINICAL DECISION MAKING: Stable/uncomplicated  EVALUATION COMPLEXITY: Low   GOALS: Goals reviewed with patient? No  SHORT TERM GOALS: Target date: 09/05/2021  Will be compliant with HEP  Baseline: Goal status: MET  2.  Will experience pain no more than 2/10  at worst  Baseline: "I don't know how to answer this"  Goal  status: IN PROGRESS  3.  Will be able to stand and take steps without initial pain or catching Baseline: 6/19- still having a hard time with steps  Goal status: IN PROGRESS   LONG TERM GOALS: Target date: 09/26/2021  MMT to improve by one grade in all weak groups  Baseline:  Goal status: INITIAL  2.  Will demonstrate good lifting mechanics from floor to waist height in order to prevent exacerbation of pain  Baseline:  Goal status: INITIAL  3.  LEFS score to improve by at least 15 points to show improvement in condition  Baseline:  Goal status: INITIAL  4.  Will be able to squat and do a flight of stairs without increase in pain  Baseline:  Goal status: Progressing      PLAN: PT FREQUENCY: 2x/week  PT DURATION: 6 weeks  PLANNED INTERVENTIONS: Therapeutic exercises, Therapeutic activity, Neuromuscular re-education, Balance training, Gait training, Patient/Family education, Joint mobilization, Stair training, Dry Needling, Electrical stimulation, Spinal mobilization, Cryotherapy, Moist heat, scar mobilization, Taping, Traction, Ultrasound, Ionotophoresis 16m/ml Dexamethasone, Manual therapy, and Re-evaluation.  PLAN FOR NEXT SESSION: Functional training, balance training and strengthening. Stretching if needed.    [place] SPTA 08/15/2021, 10:54 AM

## 2021-08-17 ENCOUNTER — Encounter: Payer: Self-pay | Admitting: Physical Therapy

## 2021-08-17 ENCOUNTER — Ambulatory Visit: Payer: PPO | Admitting: Physical Therapy

## 2021-08-17 DIAGNOSIS — M6281 Muscle weakness (generalized): Secondary | ICD-10-CM

## 2021-08-17 DIAGNOSIS — R29898 Other symptoms and signs involving the musculoskeletal system: Secondary | ICD-10-CM

## 2021-08-17 DIAGNOSIS — M461 Sacroiliitis, not elsewhere classified: Secondary | ICD-10-CM | POA: Diagnosis not present

## 2021-08-17 DIAGNOSIS — R293 Abnormal posture: Secondary | ICD-10-CM

## 2021-08-17 NOTE — Therapy (Signed)
OUTPATIENT PHYSICAL THERAPY THORACOLUMBAR TREATMENT   Patient Name: Logan Wolfe MRN: [id number] DOB:10-Jul-1945, 76 y.o., male Today's Date: 08/17/2021   PT End of Session - 08/17/21 1059     Visit Number 9    Date for PT Re-Evaluation 08/29/21    PT Start Time 45    PT Stop Time 1145    PT Time Calculation (min) 45 min    Activity Tolerance Patient tolerated treatment well    Behavior During Therapy [facility] for tasks assessed/performed              Past Medical History:  Diagnosis Date   Allergic rhinitis, cause unspecified    Anxiety    Aortic valve calcification    Cancer ([place])    Prostate CA, Dr Jeffie Pollock 22 years ago   Constipation    Depressive disorder    Dyspepsia and other specified disorders of function of stomach    GERD (gastroesophageal reflux disease)    Hypoglycemia    Memory loss    Otitis    externa of right ear   Prostate cancer ([place])    Spinal stenosis    Unspecified pruritic disorder    Past Surgical History:  Procedure Laterality Date   PROSTATE SURGERY     Patient Active Problem List   Diagnosis Date Noted   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[employer]) 03/23/2020   Diabetes mellitus screening 03/22/2020   Dysphagia 03/22/2020   Dyspnea on exertion 03/22/2020   High cholesterol 03/22/2020   Insomnia 03/22/2020   Lymphocytic colitis 03/22/2020   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[place]) 03/22/2020   Personal history of colonic polyps 03/22/2020   Spinal stenosis 03/22/2020   Dysfunction of right eustachian tube 12/30/2015   Sensorineural hearing loss 12/30/2015   Increased frequency of urination 01/09/2014   Acute, but ill-defined, cerebrovascular disease 12/27/2012   Occlusion and stenosis of carotid artery 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 cerebral infarction 12/05/2012   Aortic valve disorder 12/05/2012   Cancer ([place])    GERD (gastroesophageal reflux disease)    Hypoglycemia    Depressive disorder    Otitis     PCP:  Tamsen Meek   REFERRING PROVIDER: Lawerance Cruel, MD   REFERRING DIAG: M46.1 (ICD-10-CM) - Sacroiliitis, not elsewhere classified   Rationale for Evaluation and Treatment Rehabilitation  THERAPY DIAG:  Sacroiliitis ([place])  Muscle weakness (generalized)  Abnormal posture  Other symptoms and signs involving the musculoskeletal system  ONSET DATE: 06/27/2021   SUBJECTIVE:  SUBJECTIVE STATEMENT: I keep having pain in my right hip when I sleep at night.   PERTINENT HISTORY:  Aortic valve disorder, cerebrovascular disease, hearing loss, prostate CA with malignant neoplasm to lymph node  PAIN:  Are you having pain? Yes: NPRS scale: 2/10 Pain location: R hip  Pain description: throbbing but "difficult to describe"  Aggravating factors: lifting heavy (40#) Relieving factors: not lifting heavy but never goes away    PRECAUTIONS: None  WEIGHT BEARING RESTRICTIONS No  FALLS:  Has patient fallen in last 6 months? No  LIVING ENVIRONMENT: Lives with: lives with their spouse Lives in: House/apartment Stairs: Yes: Internal: 16 steps; on right going up and External: 2 steps; none Has following equipment at home: None  OCCUPATION: retired   PLOF: Independent, Independent with basic ADLs, Independent with gait, and Independent with transfers  PATIENT GOALS get rid of pain and not walk like an old man       TODAY'S TREATMENT  08/17/21 Recumbent bike L4 x 6 min Ambulate around back and front building and ascend/descend three flights of stairs Hamstring curls 45# 2x15 Leg extension 25# 2x12 Hip abduction/extension 3# 2x12 B SLR 2x12 B Piriformis/hamstring stretch 2x15" B  08/15/21 NuStep L5 x 6 min Ambulate around back and front building and ascend/descend two flights of stairs Squat  with 25lb box and place on ground x2, pain increased from 2/10 to 4/10 in R hip. Good form.  Resisted gait 4 way 40# x5 each  Leg extension 25# 2x10 Hamstring curl 45# 2x12 Step ups on BOSU 2x10 B, using bilateral handrail for support Balance on BOSU with 2x30" without any handrail Hamstring/piriformis stretch 2x15" B, R>L  08/10/21 NuStep L5 x 6 min Ambulate around building in grass and uneven surface x2 S2S with 8# 2x15 Hamstring curls 45# 2x12 Leg extension 20# 2x12 Resisted gait 4 way 40# x5 each, one episode of LOB when sidestepping to left 6" step ups from airex 2x15 Balance on BOSU with bilateral handrails for support 4x30". Required less support as trials progressed Step ups on BOSU 2x15 Demonstrated piriformis stretch to do at home  08/08/21 NuStep L5 x 6 min Resisted gait 40# 2x5 all directions  Ascend/descend stairs x 5 with R side handrail Ambulate around building in grass and uneven surface x 2 Tandem balance in parallel bars on airex x 5 S2S with 8# 2x15 6" Step ups from airex x 15  08/03/21 Nustep L5 x 6 mins SI MET correction for LLD pain reduced to 0/10 Hooklying ball abd 2x10 Hamstring stretches 5x10'' Piriformis stretch 3x15'' SLR 2x10 Hip Abd x10  Hooklying marches 2x10 Prone hip Ext x10 Sit to stand x15 x10 Seated hip abduction with blue TB 2x10 Step ups 6in 2x10  08/01/21 Nustep L5 x6 minutes  RLE slightly longer than the L- maybe 1/8 inch  SI MET correction for LLD pain reduced to 0/10 MMT hip flexion 3+ B, R hip ABD 3+, L hip ABD 3/5 Bridge with green TB 1x10  Sidelying clams 1x10 B  Prone hip extensions knee bent 1x10 B Hip flexor stretches 2x60 seconds R LE  Isometric hip extensions 1x10 3 second holds L LE only  Hip hikes 1x10 B   07/27/21  NuStep L5 x 6 min   40lb side steps x5 each Lateral Step ups 6in x10 each  Hip Ext & abd 10lb x10 each  Leg curls 35lb 2x12 Leg Ext 15lb 2x12 Heel raises black bar 2x15 Ball Squeezes 2x10  07/25/21 NuStep L5 x6 min Resisted gait 40lb 4way x 3 each 6in step ups x10 each  Sit to stands 2x10  Leg curls 35lb 2x10  Leg Ext 15lb 2x10  Stretches 4x10''  Hamstring  Piriformis  K2C     PATIENT EDUCATION:  Education details: exercise form and purpose, self-SI MET techniques  Person educated: Patient Education method: Customer service manager Education comprehension: verbalized understanding and returned demonstration   HOME EXERCISE PROGRAM: [id number]  ASSESSMENT:  CLINICAL IMPRESSION:    Pt enters today with a 2/10 pain. He states this pain is consistent all the time and hasn't found anything to relieve it. Still says pain increases to 4/10 when maneuvering stairs or squatting. Also reports increase in pain when performing exercises with SLS on RLE. Talked to pt again about contacting his doctor for additional evaluation. We did additional strengthening exercises today which pt participated well in. Verbal cues needed to maintain upright posture during standing exercises. Will benefit from additional strengthening and stretching.   OBJECTIVE IMPAIRMENTS Abnormal gait, difficulty walking, decreased ROM, decreased strength, decreased safety awareness, hypomobility, increased fascial restrictions, increased muscle spasms, improper body mechanics, postural dysfunction, and pain.   ACTIVITY LIMITATIONS carrying, lifting, squatting, stairs, transfers, bed mobility, and locomotion level  PARTICIPATION LIMITATIONS: community activity and yard work  [place], Past/current experiences, and Time since onset of injury/illness/exacerbation are also affecting patient's functional outcome.   REHAB POTENTIAL: Good  CLINICAL DECISION MAKING: Stable/uncomplicated  EVALUATION COMPLEXITY: Low   GOALS: Goals reviewed with patient? No  SHORT TERM GOALS: Target date: 09/07/2021  Will be compliant with HEP  Baseline: Goal status: MET  2.  Will experience pain  no more than 2/10  at worst  Baseline: "I don't know how to answer this"  Goal status: IN PROGRESS  3.  Will be able to stand and take steps without initial pain or catching Baseline: 7/5 - progressing  Goal status: IN PROGRESS   LONG TERM GOALS: Target date: 09/28/2021  MMT to improve by one grade in all weak groups  Baseline:  Goal status: INITIAL  2.  Will demonstrate good lifting mechanics from floor to waist height in order to prevent exacerbation of pain  Baseline:  Goal status: INITIAL  3.  LEFS score to improve by at least 15 points to show improvement in condition  Baseline:  Goal status: INITIAL  4.  Will be able to squat and do a flight of stairs without increase in pain  Baseline:  Goal status: Progressing - 7/5 can perform but pain increases from 2/10 to 4/10     PLAN: PT FREQUENCY: 2x/week  PT DURATION: 6 weeks  PLANNED INTERVENTIONS: Therapeutic exercises, Therapeutic activity, Neuromuscular re-education, Balance training, Gait training, Patient/Family education, Joint mobilization, Stair training, Dry Needling, Electrical stimulation, Spinal mobilization, Cryotherapy, Moist heat, scar mobilization, Taping, Traction, Ultrasound, Ionotophoresis 50m/ml Dexamethasone, Manual therapy, and Re-evaluation.  PLAN FOR NEXT SESSION: Functional training, balance training and strengthening. Stretching if needed.    Arielle Ooms, SPTA 08/17/2021, 11:00 AM

## 2021-08-22 ENCOUNTER — Ambulatory Visit: Payer: PPO

## 2021-08-24 ENCOUNTER — Ambulatory Visit: Payer: PPO | Admitting: Physical Therapy

## 2021-08-28 NOTE — Progress Notes (Unsigned)
Cardiology Office Note   Date:  08/29/2021   ID:  Avion, Logan Wolfe, 1947, MRN [id number]  PCP:  Lawerance Cruel, MD    No chief complaint on file.  Aortic stenosis  Wt Readings from Last 3 Encounters:  08/29/21 224 lb (101.6 kg)  03/23/20 224 lb 8 oz (101.8 kg)  07/11/19 215 lb 1.9 oz (97.6 kg)       History of Present Illness: Logan Wolfe is a 76 y.o. male   who had transient confusion for 15 minutes in 2014.  He could not read certain words.  He had trouble spelling certain words as well.  He saw Dr. Harrington Challenger who sent him for MRI of his brain. This did not show evidence of recent embolic phenomenon at the time.  He has had a prior cerebellar infarct.    2014 echo showed: Left ventricle: The cavity size was normal. There was mild   focal basal hypertrophy of the septum. Systolic function   was normal. The estimated ejection fraction was in the   range of 60% to 65%. Wall motion was normal; there were no   regional wall motion abnormalities. - Aortic valve: There was mild stenosis by velocities,   although leafelt excursion appearedmoderately restricted.   Trivial regurgitation. - Mitral valve: Calcified annulus. Mildly calcified leaflets   . Mild regurgitation.   Jan 2019: Normal LVEF. Aortic stenosis is still only mild.       Since the last visit, he go this COVID shots.   In 2021: "Rare shortness of breath- not related to exertion.  He is not walking much due to a foot problem related to a callous.   High PSA managed with urology. "  Over the past year, he has rare episodes of chest tightnesss when he does very strenuous activity.  It resolves after 5 minutes of rest.  Worse in hot weather.  3 episodes in the past year.   No episodes in the past several. No problems walking upstairs.  Walks stairs 8-10 times a day.  Denies : Dizziness. Leg edema. Nitroglycerin use. Orthopnea. Palpitations. Paroxysmal nocturnal dyspnea. Shortness of breath. Syncope.      Past Medical History:  Diagnosis Date   Allergic rhinitis, cause unspecified    Anxiety    Aortic valve calcification    Cancer ([facility])    Prostate CA, Dr Logan Wolfe 22 years ago   Constipation    Depressive disorder    Dyspepsia and other specified disorders of function of stomach    GERD (gastroesophageal reflux disease)    Hypoglycemia    Memory loss    Otitis    externa of right ear   Prostate cancer ([place])    Spinal stenosis    Unspecified pruritic disorder     Past Surgical History:  Procedure Laterality Date   PROSTATE SURGERY       Current Outpatient Medications  Medication Sig Dispense Refill   fluticasone (FLONASE) 50 MCG/ACT nasal spray 1 spray in each nostril     Melatonin 10 MG SUBL 1 tablet at bedtime as needed with food     meloxicam (MOBIC) 15 MG tablet Take 15 mg by mouth daily.     No current facility-administered medications for this visit.    Allergies:   Doxycycline, Neosporin [bacitracin-polymyxin b], and Penicillins    Social History:  The patient  reports that he has been smoking cigars. He has never used smokeless tobacco. He reports current alcohol use.  He reports that he does not use drugs.   Family History:  The patient's family history includes Diabetes in his mother; Hyperlipidemia in his mother; Prostate cancer in his brother; Skin cancer in his father.    ROS:  Please see the history of present illness.   Otherwise, review of systems are positive for chest tightness- rare.   All other systems are reviewed and negative.    PHYSICAL EXAM: VS:  BP 120/68   Pulse 74   Ht '6\' 2"'$  (1.88 m)   Wt 224 lb (101.6 kg)   SpO2 97%   BMI 28.76 kg/m  , BMI Body mass index is 28.76 kg/m. GEN: Well nourished, well developed, in no acute distress HEENT: normal Neck: no JVD, carotid bruits, or masses Cardiac: RRR; 3/6 murmurs, rubs, or gallops,no edema  Respiratory:  clear to auscultation bilaterally, normal work of breathing GI: soft, nontender,  nondistended, + BS MS: no deformity or atrophy Skin: warm and dry, no rash Neuro:  Strength and sensation are intact Psych: euthymic mood, full affect   EKG:   The ekg ordered today demonstrates normal sinus rhythm, no change from 2021 ECG   Recent Labs: No results found for requested labs within last 365 days.   Lipid Panel    Component Value Date/Time   CHOL 210 (H) 11/Wolfe/2014 1109   TRIG 142 11/Wolfe/2014 1109   HDL 56 11/Wolfe/2014 1109   CHOLHDL 3.8 11/Wolfe/2014 1109   LDLCALC 126 (H) 11/Wolfe/2014 1109     Other studies Reviewed: Additional studies/ records that were reviewed today with results demonstrating: LDL 130 in May 2023.  Triglycerides 241   ASSESSMENT AND PLAN:  Aortic stenosis: Very mild symptoms noted with very strenuous exercise.  These are not activities that he does often.  He reports 3 episodes in the past year.  Will check echocardiogram to see if aortic stenosis has progressed.  I did counsel him to try to avoid dangerous activities.  He has climbed ladders, and tried to do very heavy lifting.  He states that his wife and his neighbors all advise him to avoid these activities.  Further work-up based on echo results. Carotid artery disease: Mild in the past.  No bruits on exam. Hyperlipidemia: Healthy diet with regular exercise.  May need to consider lipid-lowering therapy if readings stay elevated.   Current medicines are reviewed at length with the patient today.  The patient concerns regarding his medicines were addressed.  The following changes have been made:  No change  Labs/ tests ordered today include: echo No orders of the defined types were placed in this encounter.   Recommend 150 minutes/week of aerobic exercise Low fat, low carb, high fiber diet recommended  Disposition:   FU in 6 months   Signed, Larae Grooms, MD  08/29/2021 3:32 PM    [facility] [place], [place], [place]  [address] Phone: [phone];  Fax: [phone]

## 2021-08-29 ENCOUNTER — Encounter: Payer: Self-pay | Admitting: Interventional Cardiology

## 2021-08-29 ENCOUNTER — Ambulatory Visit: Payer: PPO | Admitting: Interventional Cardiology

## 2021-08-29 VITALS — BP 120/68 | HR 74 | Ht 74.0 in | Wt 224.0 lb

## 2021-08-29 DIAGNOSIS — I6523 Occlusion and stenosis of bilateral carotid arteries: Secondary | ICD-10-CM | POA: Diagnosis not present

## 2021-08-29 DIAGNOSIS — E782 Mixed hyperlipidemia: Secondary | ICD-10-CM

## 2021-08-29 DIAGNOSIS — I359 Nonrheumatic aortic valve disorder, unspecified: Secondary | ICD-10-CM

## 2021-08-29 NOTE — Patient Instructions (Signed)
Medication Instructions:  ?Your physician recommends that you continue on your current medications as directed. Please refer to the Current Medication list given to you today. ? ?*If you need a refill on your cardiac medications before your next appointment, please call your pharmacy* ? ? ?Lab Work: ?none ?If you have labs (blood work) drawn today and your tests are completely normal, you will receive your results only by: ?MyChart Message (if you have MyChart) OR ?A paper copy in the mail ?If you have any lab test that is abnormal or we need to change your treatment, we will call you to review the results. ? ? ?Testing/Procedures: ?Your physician has requested that you have an echocardiogram. Echocardiography is a painless test that uses sound waves to create images of your heart. It provides your doctor with information about the size and shape of your heart and how well your heart?s chambers and valves are working. This procedure takes approximately one hour. There are no restrictions for this procedure. ? ? ? ?Follow-Up: ?At [facility], you and your health needs are our priority.  As part of our continuing mission to provide you with exceptional heart care, we have created designated Provider Care Teams.  These Care Teams include your primary Cardiologist (physician) and Advanced Practice Providers (APPs -  Physician Assistants and Nurse Practitioners) who all work together to provide you with the care you need, when you need it. ? ?We recommend signing up for the patient portal called "MyChart".  Sign up information is provided on this After Visit Summary.  MyChart is used to connect with patients for Virtual Visits (Telemedicine).  Patients are able to view lab/test results, encounter notes, upcoming appointments, etc.  Non-urgent messages can be sent to your provider as well.   ?To learn more about what you can do with MyChart, go to [email or web address].   ? ?Your next appointment:   ?6  month(s) ? ?The format for your next appointment:   ?In Person ? ?Provider:   ?Jayadeep Varanasi, MD   ? ? ?Other Instructions ? ? ?Important Information About Sugar ? ? ? ? ? ? ?

## 2021-09-02 ENCOUNTER — Ambulatory Visit (HOSPITAL_COMMUNITY): Payer: PPO | Attending: Interventional Cardiology

## 2021-09-02 DIAGNOSIS — I359 Nonrheumatic aortic valve disorder, unspecified: Secondary | ICD-10-CM | POA: Insufficient documentation

## 2021-09-02 LAB — ECHOCARDIOGRAM COMPLETE
AR max vel: 1 cm2
AV Area VTI: 0.99 cm2
AV Area mean vel: 1 cm2
AV Mean grad: 23 mmHg
AV Peak grad: 43.3 mmHg
Ao pk vel: 3.29 m/s
Area-P 1/2: 3.17 cm2
P 1/2 time: 421 msec
S' Lateral: 2.5 cm

## 2021-09-14 DIAGNOSIS — C61 Malignant neoplasm of prostate: Secondary | ICD-10-CM | POA: Diagnosis not present

## 2021-09-19 DIAGNOSIS — M1611 Unilateral primary osteoarthritis, right hip: Secondary | ICD-10-CM | POA: Diagnosis not present

## 2021-09-19 DIAGNOSIS — M25551 Pain in right hip: Secondary | ICD-10-CM | POA: Diagnosis not present

## 2021-09-21 DIAGNOSIS — R351 Nocturia: Secondary | ICD-10-CM | POA: Diagnosis not present

## 2021-09-21 DIAGNOSIS — C775 Secondary and unspecified malignant neoplasm of intrapelvic lymph nodes: Secondary | ICD-10-CM | POA: Diagnosis not present

## 2021-09-21 DIAGNOSIS — N3946 Mixed incontinence: Secondary | ICD-10-CM | POA: Diagnosis not present

## 2021-09-21 DIAGNOSIS — C61 Malignant neoplasm of prostate: Secondary | ICD-10-CM | POA: Diagnosis not present

## 2021-09-26 DIAGNOSIS — M25551 Pain in right hip: Secondary | ICD-10-CM | POA: Diagnosis not present

## 2021-10-03 DIAGNOSIS — M25551 Pain in right hip: Secondary | ICD-10-CM | POA: Diagnosis not present

## 2021-10-10 DIAGNOSIS — H52223 Regular astigmatism, bilateral: Secondary | ICD-10-CM | POA: Diagnosis not present

## 2021-10-10 DIAGNOSIS — H40013 Open angle with borderline findings, low risk, bilateral: Secondary | ICD-10-CM | POA: Diagnosis not present

## 2021-10-10 DIAGNOSIS — H524 Presbyopia: Secondary | ICD-10-CM | POA: Diagnosis not present

## 2021-10-10 DIAGNOSIS — H25813 Combined forms of age-related cataract, bilateral: Secondary | ICD-10-CM | POA: Diagnosis not present

## 2021-10-10 DIAGNOSIS — H5203 Hypermetropia, bilateral: Secondary | ICD-10-CM | POA: Diagnosis not present

## 2021-10-26 DIAGNOSIS — M25551 Pain in right hip: Secondary | ICD-10-CM | POA: Diagnosis not present

## 2021-11-11 DIAGNOSIS — M1611 Unilateral primary osteoarthritis, right hip: Secondary | ICD-10-CM | POA: Diagnosis not present

## 2021-12-22 DIAGNOSIS — M5451 Vertebrogenic low back pain: Secondary | ICD-10-CM | POA: Diagnosis not present

## 2021-12-26 DIAGNOSIS — K52832 Lymphocytic colitis: Secondary | ICD-10-CM | POA: Diagnosis not present

## 2021-12-26 DIAGNOSIS — C61 Malignant neoplasm of prostate: Secondary | ICD-10-CM | POA: Diagnosis not present

## 2021-12-26 DIAGNOSIS — Z86010 Personal history of colonic polyps: Secondary | ICD-10-CM | POA: Diagnosis not present

## 2021-12-27 DIAGNOSIS — R059 Cough, unspecified: Secondary | ICD-10-CM | POA: Diagnosis not present

## 2021-12-27 DIAGNOSIS — U071 COVID-19: Secondary | ICD-10-CM | POA: Diagnosis not present

## 2022-01-11 DIAGNOSIS — C61 Malignant neoplasm of prostate: Secondary | ICD-10-CM | POA: Diagnosis not present

## 2022-01-12 DIAGNOSIS — M5416 Radiculopathy, lumbar region: Secondary | ICD-10-CM | POA: Diagnosis not present

## 2022-01-18 DIAGNOSIS — N3942 Incontinence without sensory awareness: Secondary | ICD-10-CM | POA: Diagnosis not present

## 2022-01-18 DIAGNOSIS — C775 Secondary and unspecified malignant neoplasm of intrapelvic lymph nodes: Secondary | ICD-10-CM | POA: Diagnosis not present

## 2022-01-18 DIAGNOSIS — C61 Malignant neoplasm of prostate: Secondary | ICD-10-CM | POA: Diagnosis not present

## 2022-01-19 DIAGNOSIS — M5451 Vertebrogenic low back pain: Secondary | ICD-10-CM | POA: Diagnosis not present

## 2022-01-25 DIAGNOSIS — R300 Dysuria: Secondary | ICD-10-CM | POA: Diagnosis not present

## 2022-02-22 ENCOUNTER — Other Ambulatory Visit (HOSPITAL_COMMUNITY): Payer: Self-pay | Admitting: Urology

## 2022-02-22 DIAGNOSIS — C775 Secondary and unspecified malignant neoplasm of intrapelvic lymph nodes: Secondary | ICD-10-CM

## 2022-02-22 DIAGNOSIS — C61 Malignant neoplasm of prostate: Secondary | ICD-10-CM

## 2022-02-23 DIAGNOSIS — M5416 Radiculopathy, lumbar region: Secondary | ICD-10-CM | POA: Diagnosis not present

## 2022-03-04 ENCOUNTER — Emergency Department (HOSPITAL_BASED_OUTPATIENT_CLINIC_OR_DEPARTMENT_OTHER): Payer: PPO

## 2022-03-04 ENCOUNTER — Encounter (HOSPITAL_BASED_OUTPATIENT_CLINIC_OR_DEPARTMENT_OTHER): Payer: Self-pay | Admitting: Emergency Medicine

## 2022-03-04 ENCOUNTER — Observation Stay (HOSPITAL_BASED_OUTPATIENT_CLINIC_OR_DEPARTMENT_OTHER)
Admission: EM | Admit: 2022-03-04 | Discharge: 2022-03-05 | Disposition: A | Discharge status: Home / Self Care | Payer: PPO | Attending: Emergency Medicine | Admitting: Emergency Medicine

## 2022-03-04 ENCOUNTER — Encounter (HOSPITAL_COMMUNITY): Payer: Self-pay

## 2022-03-04 ENCOUNTER — Other Ambulatory Visit: Payer: Self-pay

## 2022-03-04 DIAGNOSIS — F17290 Nicotine dependence, other tobacco product, uncomplicated: Secondary | ICD-10-CM | POA: Insufficient documentation

## 2022-03-04 DIAGNOSIS — I471 Supraventricular tachycardia, unspecified: Principal | ICD-10-CM | POA: Insufficient documentation

## 2022-03-04 DIAGNOSIS — R7989 Other specified abnormal findings of blood chemistry: Secondary | ICD-10-CM | POA: Insufficient documentation

## 2022-03-04 DIAGNOSIS — E871 Hypo-osmolality and hyponatremia: Secondary | ICD-10-CM | POA: Diagnosis not present

## 2022-03-04 DIAGNOSIS — N179 Acute kidney failure, unspecified: Secondary | ICD-10-CM | POA: Diagnosis not present

## 2022-03-04 DIAGNOSIS — R778 Other specified abnormalities of plasma proteins: Secondary | ICD-10-CM | POA: Insufficient documentation

## 2022-03-04 DIAGNOSIS — Z8546 Personal history of malignant neoplasm of prostate: Secondary | ICD-10-CM | POA: Insufficient documentation

## 2022-03-04 DIAGNOSIS — I350 Nonrheumatic aortic (valve) stenosis: Secondary | ICD-10-CM | POA: Diagnosis not present

## 2022-03-04 DIAGNOSIS — E8721 Acute metabolic acidosis: Secondary | ICD-10-CM | POA: Insufficient documentation

## 2022-03-04 DIAGNOSIS — R0602 Shortness of breath: Secondary | ICD-10-CM | POA: Diagnosis not present

## 2022-03-04 DIAGNOSIS — R079 Chest pain, unspecified: Secondary | ICD-10-CM | POA: Diagnosis not present

## 2022-03-04 DIAGNOSIS — I340 Nonrheumatic mitral (valve) insufficiency: Secondary | ICD-10-CM

## 2022-03-04 DIAGNOSIS — I214 Non-ST elevation (NSTEMI) myocardial infarction: Secondary | ICD-10-CM | POA: Diagnosis not present

## 2022-03-04 DIAGNOSIS — R0789 Other chest pain: Secondary | ICD-10-CM | POA: Diagnosis present

## 2022-03-04 DIAGNOSIS — E872 Acidosis, unspecified: Secondary | ICD-10-CM | POA: Insufficient documentation

## 2022-03-04 LAB — D-DIMER, QUANTITATIVE: D-Dimer, Quant: 0.42 ug/mL-FEU (ref 0.00–0.50)

## 2022-03-04 LAB — CBC WITH DIFFERENTIAL/PLATELET
Abs Immature Granulocytes: 0.02 10*3/uL (ref 0.00–0.07)
Basophils Absolute: 0 10*3/uL (ref 0.0–0.1)
Basophils Relative: 0 %
Eosinophils Absolute: 0 10*3/uL (ref 0.0–0.5)
Eosinophils Relative: 0 %
HCT: 37.6 % — ABNORMAL LOW (ref 39.0–52.0)
Hemoglobin: 13 g/dL (ref 13.0–17.0)
Immature Granulocytes: 0 %
Lymphocytes Relative: 28 %
Lymphs Abs: 2.4 10*3/uL (ref 0.7–4.0)
MCH: 33.5 pg (ref 26.0–34.0)
MCHC: 34.6 g/dL (ref 30.0–36.0)
MCV: 96.9 fL (ref 80.0–100.0)
Monocytes Absolute: 1.1 10*3/uL — ABNORMAL HIGH (ref 0.1–1.0)
Monocytes Relative: 12 %
Neutro Abs: 5.2 10*3/uL (ref 1.7–7.7)
Neutrophils Relative %: 60 %
Platelets: 265 10*3/uL (ref 150–400)
RBC: 3.88 MIL/uL — ABNORMAL LOW (ref 4.22–5.81)
RDW: 13.3 % (ref 11.5–15.5)
WBC: 8.8 10*3/uL (ref 4.0–10.5)
nRBC: 0 % (ref 0.0–0.2)

## 2022-03-04 LAB — BASIC METABOLIC PANEL
Anion gap: 15 (ref 5–15)
BUN: 19 mg/dL (ref 8–23)
CO2: 19 mmol/L — ABNORMAL LOW (ref 22–32)
Calcium: 9 mg/dL (ref 8.9–10.3)
Chloride: 100 mmol/L (ref 98–111)
Creatinine, Ser: 1.34 mg/dL — ABNORMAL HIGH (ref 0.61–1.24)
GFR, Estimated: 54 mL/min — ABNORMAL LOW (ref >60)
Glucose, Bld: 160 mg/dL — ABNORMAL HIGH (ref 70–99)
Potassium: 3.7 mmol/L (ref 3.5–5.1)
Sodium: 134 mmol/L — ABNORMAL LOW (ref 135–145)

## 2022-03-04 LAB — CBG MONITORING, ED: Glucose-Capillary: 170 mg/dL — ABNORMAL HIGH (ref 70–99)

## 2022-03-04 LAB — TROPONIN I (HIGH SENSITIVITY)
Troponin I (High Sensitivity): 19 ng/L — ABNORMAL HIGH (ref <18)
Troponin I (High Sensitivity): 273 ng/L (ref <18)

## 2022-03-04 LAB — BRAIN NATRIURETIC PEPTIDE: B Natriuretic Peptide: 169.6 pg/mL — ABNORMAL HIGH (ref 0.0–100.0)

## 2022-03-04 LAB — MAGNESIUM: Magnesium: 2.1 mg/dL (ref 1.7–2.4)

## 2022-03-04 MED ORDER — ASPIRIN 81 MG PO CHEW: 324.0000 mg | CHEWABLE_TABLET | Freq: Once | ORAL | Status: DC

## 2022-03-04 MED ORDER — HEPARIN BOLUS VIA INFUSION
4000.0000 [IU] | Freq: Once | INTRAVENOUS | Status: AC
  Administered 2022-03-04: 4000 [IU] via INTRAVENOUS

## 2022-03-04 MED ORDER — ADENOSINE 6 MG/2ML IV SOLN: 6.0000 mg | Freq: Once | INTRAVENOUS | Status: AC

## 2022-03-04 MED ORDER — ADENOSINE 6 MG/2ML IV SOLN
INTRAVENOUS | Status: AC
  Administered 2022-03-04: 6 mg via INTRAVENOUS
  Filled 2022-03-04: qty 8

## 2022-03-04 MED ORDER — ADENOSINE 6 MG/2ML IV SOLN
12.0000 mg | Freq: Once | INTRAVENOUS | Status: AC
  Administered 2022-03-04: 12 mg via INTRAVENOUS

## 2022-03-04 MED ORDER — HEPARIN (PORCINE) 25000 UT/250ML-% IV SOLN
1150.0000 [IU]/h | INTRAVENOUS | Status: DC
  Administered 2022-03-04: 1300 [IU]/h via INTRAVENOUS
  Filled 2022-03-04: qty 250

## 2022-03-04 NOTE — ED Notes (Signed)
ED Provider at bedside. 

## 2022-03-04 NOTE — ED Notes (Signed)
Patient ambulated to the bedside commode. Patient is now back in bed resting. Wife is at the bedside.

## 2022-03-04 NOTE — ED Provider Notes (Signed)
[place] [facility] Provider Note   CSN: [id number] Arrival date & time: 03/04/22  1350     History  Chief Complaint  Patient presents with   Chest Pain    Logan Wolfe is a 77 y.o. 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   Patient presents with palpations acute onset a/w SOB and central CP that started approximately 30 min PTA. Was not exerting himself when it started. Has otherwise been in his [place]. Denies N/V/D/C, recent illnesses, cough, f/c, LEE. EKG in triage demonstrates SVT at a rate of 180 bpm, BP in 389H systolic.    Chest Pain      Home Medications Prior to Admission medications   Medication Sig Start Date End Date Taking? Authorizing Provider  fluticasone (FLONASE) 50 MCG/ACT nasal spray 1 spray in each nostril    [provider]  Melatonin 10 MG SUBL 1 tablet at bedtime as needed with food    [provider]  meloxicam (MOBIC) 15 MG tablet Take 15 mg by mouth daily. 08/26/21   [provider]      Allergies    Doxycycline, Neosporin [bacitracin-polymyxin b], and Penicillins    Review of Systems   Review of Systems  Cardiovascular:  Positive for chest pain.   Review of systems Negative for f/c.  A 10 point review of systems was performed and is negative unless otherwise reported in HPI.  Physical Exam Updated Vital Signs BP 120/76   Pulse 98   Temp 97.6 F (36.4 C) (Tympanic)   Resp 19   SpO2 100%  Physical Exam General: Anxious appearing male, lying in bed, in moderate distress HEENT: Sclera anicteric, MMM, trachea midline.  Cardiology: Regular tachycardic rate, no murmurs/rubs/gallops. BL radial and DP pulses equal bilaterally.  Resp: Increased resp rate and effort. CTAB, no wheezes, rhonchi, crackles.  Abd: Soft, non-tender, non-distended. No rebound  tenderness or guarding.  GU: Deferred. MSK: No peripheral edema or signs of trauma. Extremities without deformity or TTP. No cyanosis or clubbing. Skin: warm, dry. No rashes or lesions. Neuro: A&Ox4, CNs II-XII grossly intact. MAEs. Sensation grossly intact.  Psych: Very anxious affect.   ED Results / Procedures / Treatments   Labs (all labs ordered are listed, but only abnormal results are displayed) Labs Reviewed  CBC WITH DIFFERENTIAL/PLATELET - Abnormal; Notable for the following components:      Result Value   RBC 3.88 (*)    HCT 37.6 (*)    Monocytes Absolute 1.1 (*)    All other components within normal limits  BASIC METABOLIC PANEL - Abnormal; Notable for the following components:   Sodium 134 (*)    CO2 19 (*)    Glucose, Bld 160 (*)    Creatinine, Ser 1.34 (*)    GFR, Estimated 54 (*)    All other components within normal limits  BRAIN NATRIURETIC PEPTIDE - Abnormal; Notable for the following components:   B Natriuretic Peptide 169.6 (*)    All other components within normal limits  CBG MONITORING, ED - Abnormal; Notable for the following components:   Glucose-Capillary 170 (*)    All other components within normal limits  TROPONIN I (HIGH SENSITIVITY) - Abnormal; Notable for the following components:   Troponin I (High Sensitivity) 19 (*)    All other components within normal limits  MAGNESIUM  D-DIMER, QUANTITATIVE  EKG EKG Interpretation  Date/Time:  Saturday March 04 2022 13:55:44 EST Ventricular Rate:  176 PR Interval:  70 QRS Duration: 85 QT Interval:  269 QTC Calculation: 461 R Axis:   30 Text Interpretation: Supraventricular tachycardia Confirmed by Cindee Lame [phone]) on 03/04/2022 1:58:45 PM  Radiology DG Chest Portable 1 View  Result Date: 03/04/2022 CLINICAL DATA:  Chest pain, shortness of breath for 2-3 hours, occasional smoker, history GERD EXAM: PORTABLE CHEST 1 VIEW COMPARISON:  Portable exam 1515 hours compared to 02/19/2017  FINDINGS: External pacing leads project over chest. Normal heart size, mediastinal contours, and pulmonary vascularity. Atherosclerotic calcification aorta. Lungs clear. No pulmonary infiltrate, pleural effusion, or pneumothorax. No acute osseous findings. IMPRESSION: No acute abnormalities. Aortic Atherosclerosis (ICD10-I70.0). Electronically Signed   By: Lavonia Dana M.D.   On: 03/04/2022 15:46    Procedures .Cardioversion  Date/Time: 03/04/2022 3:55 PM  Performed by: Audley Hose, MD Authorized by: Audley Hose, MD   Consent:    Consent obtained:  Verbal   Consent given by:  Patient   Risks discussed:  Death, induced arrhythmia and pain   Alternatives discussed:  No treatment, rate-control medication and delayed treatment Pre-procedure details:    Cardioversion basis:  Emergent   Rhythm:  Supraventricular tachycardia   Electrode placement:  Anterior-posterior Patient sedated: No Post-procedure details:    Patient status:  Awake   Patient tolerance of procedure:  Tolerated well, no immediate complications Comments:     Patient chemically cardioverted with 12 mg IV adenosine on attempt #2 to sinus tachycardia rate 100-110 bpm.       Medications Ordered in ED Medications  adenosine (ADENOCARD) 6 MG/2ML injection 6 mg (6 mg Intravenous Given 03/04/22 1420)  adenosine (ADENOCARD) 6 MG/2ML injection 12 mg (12 mg Intravenous Given 03/04/22 1422)    ED Course/ Medical Decision Making/ A&P                          Medical Decision Making Amount and/or Complexity of Data Reviewed Labs: ordered. Decision-making details documented in ED Course. Radiology: ordered.  Risk Prescription drug management. Decision regarding hospitalization.    This patient presents to the ED for concern of CP, palpitations, SOB found to be in SVT; this involves an extensive number of treatment options, and is a complaint that carries with it a high risk of complications and morbidity.  I considered  the following differential and admission for this acute, potentially life threatening condition.   MDM:    Patient's presentation w/ rate of 180s, palpitations, CP/SOB with EKG on arrival demonstrating SVT. Considered VT but EKG shows narrow QRS complex, no fusion complexes. Attempted vagal maneuvers but unsuccessful so will give adenosine. For causes of SVT, will consider electrolyte abnormalities, dehydration. Will also consider ACS given CP though likely arrhythmia-related. For his tachypnea consider his arrhythmia, also consider acute heart failure though no crackles or peripheral edema noted on exam; pleural effusion. He has had no cough/f/c to indicate PNA, no wheezes to suggest COPD/asthma. No signs/symptoms of DVT but cannot r/o PE. Will reassess after cardioversion.    Clinical Course as of 03/14/22 1943  Sat Mar 04, 2022  1426 Attempted vagal maneuvers x 2 without success. Patient administered adenosine 6 mg but IV infiltrated. Started new IV and administered 12 mg IV adenosine with conversion to sinus tachycardia rate 100-110 bpm.  [HN]  1523 An hour after chemical cardioversion, patient continues to be tachypneic and mildly tachycardic. Will  add on dimer, CXR, BNP [HN]  1529 Creatinine(!): 1.34 AKI up from 0.8-0.9 [HN]  1539 Received sign out from Dr. Mayra Neer pending troponin, BNP. Patient initially presented with SVT and converted. Initially had some chest pain. Will re-assess [WS]  1913 Discussed with Dr. Vickki Muff, given significant elevation in troponin, recommends admission for NSTEMI and initiation of heparin drip.  Patient agreeable to admission.  Will admit. [WS]  1933 Discussed with Dr. Alcario Drought who will place admission orders for further management.  [WS]    Clinical Course User Index [HN] Audley Hose, MD [WS] Cristie Hem, MD    Labs: I Ordered, and personally interpreted labs.  The pertinent results include:  those listed above  Imaging Studies ordered: I ordered  imaging studies including CXR I independently visualized and interpreted imaging. I agree with the radiologist interpretation  Additional history obtained from chart review, wife at bedside.    Cardiac Monitoring: The patient was maintained on a cardiac monitor.  I personally viewed and interpreted the cardiac monitored which showed an underlying rhythm of: SVT then ST then NSR  Reevaluation: After the interventions noted above, I reevaluated the patient and found that they have :improved  Social Determinants of Health: Patient lives independently   Disposition:  Patient is signed out to the oncoming ED physician Dr Truett Mainland who is made aware of his history, presentation, exam, workup, and plan. Pending remainder of labs including repeat troponin.    Co morbidities that complicate the patient evaluation  Past Medical History:  Diagnosis Date   Allergic rhinitis, cause unspecified    Anxiety    Aortic valve calcification    Cancer (HCC)    Prostate CA, Dr Jeffie Pollock 22 years ago   Constipation    Depressive disorder    Dyspepsia and other specified disorders of function of stomach    GERD (gastroesophageal reflux disease)    Hypoglycemia    Memory loss    Otitis    externa of right ear   Prostate cancer ([place])    Spinal stenosis    Unspecified pruritic disorder      Medicines Meds ordered this encounter  Medications   adenosine (ADENOCARD) 6 MG/2ML injection    Pegram, Jerri F: cabinet override   adenosine (ADENOCARD) 6 MG/2ML injection 6 mg   adenosine (ADENOCARD) 6 MG/2ML injection 12 mg    I have reviewed the patients home medicines and have made adjustments as needed  Problem List / ED Course: Problem List Items Addressed This Visit   None Visit Diagnoses     SVT (supraventricular tachycardia)    -  Primary   Relevant Medications   adenosine (ADENOCARD) 6 MG/2ML injection 6 mg (Completed)   adenosine (ADENOCARD) 6 MG/2ML injection 12 mg (Completed)   AKI  (acute kidney injury) ([place])                       This note was created using dictation software, which may contain spelling or grammatical errors.    Audley Hose, MD 03/14/22 [phone]

## 2022-03-04 NOTE — ED Triage Notes (Signed)
Cp , palpations and sob started a short while ago  pt very sob

## 2022-03-04 NOTE — ED Notes (Signed)
Gave adenosine '6mg'$  1x. IV infiltrated. Started 2nd IV. Gave '12mg'$  adenosine. Pt converted to sinus tachycardia at 120s.

## 2022-03-04 NOTE — ED Provider Notes (Signed)
Physical Exam  BP 131/71   Pulse 75   Temp 97.7 F (36.5 C) (Oral)   Resp 17   SpO2 99%   Physical Exam Vitals and nursing note reviewed.  Constitutional:      General: He is not in acute distress.    Appearance: Normal appearance.  HENT:     Mouth/Throat:     Mouth: Mucous membranes are moist.  Eyes:     Conjunctiva/sclera: Conjunctivae normal.  Cardiovascular:     Rate and Rhythm: Normal rate and regular rhythm.  Pulmonary:     Effort: Pulmonary effort is normal. No respiratory distress.     Breath sounds: Normal breath sounds.  Abdominal:     General: Abdomen is flat.     Palpations: Abdomen is soft.     Tenderness: There is no abdominal tenderness.  Musculoskeletal:     Right lower leg: No edema.     Left lower leg: No edema.  Skin:    General: Skin is warm and dry.     Capillary Refill: Capillary refill takes less than 2 seconds.  Neurological:     Mental Status: He is alert and oriented to person, place, and time. Mental status is at baseline.  Psychiatric:        Mood and Affect: Mood normal.        Behavior: Behavior normal.     Procedures  .Critical Care  Performed by: Cristie Hem, MD Authorized by: Cristie Hem, MD   Critical care provider statement:    Critical care time (minutes):  30   Critical care time was exclusive of:  Separately billable procedures and treating other patients   Critical care was necessary to treat or prevent imminent or life-threatening deterioration of the following conditions:  Cardiac failure   Critical care was time spent personally by me on the following activities:  Development of treatment plan with patient or surrogate, discussions with consultants, evaluation of patient's response to treatment, examination of patient, ordering and review of laboratory studies, ordering and review of radiographic studies, ordering and performing treatments and interventions, pulse oximetry, re-evaluation of patient's  condition and review of old charts   Care discussed with: admitting provider     ED Course / MDM   Clinical Course as of 03/04/22 1934  Sat Mar 04, 2022  1426 Attempted vagal maneuvers x 2 without success. Patient administered adenosine 6 mg but IV infiltrated. Started new IV and administered 12 mg IV adenosine with conversion to sinus tachycardia rate 100-110 bpm.  [HN]  1523 An hour after chemical cardioversion, patient continues to be tachypneic and mildly tachycardic. Will add on dimer, CXR, BNP [HN]  1529 Creatinine(!): 1.34 AKI up from 0.8-0.9 [HN]  1539 Received sign out from Dr. Mayra Neer pending troponin, BNP. Patient initially presented with SVT and converted. Initially had some chest pain. Will re-assess [WS]  1913 Discussed with Dr. Vickki Muff, given significant elevation in troponin, recommends admission for NSTEMI and initiation of heparin drip.  Patient agreeable to admission.  Will admit. [WS]  1933 Discussed with Dr. Alcario Drought who will place admission orders for further management.  [WS]    Clinical Course User Index [HN] Audley Hose, MD [WS] Cristie Hem, MD   Medical Decision Making Amount and/or Complexity of Data Reviewed Labs: ordered. Decision-making details documented in ED Course. Radiology: ordered.  Risk OTC drugs. Prescription drug management. Decision regarding hospitalization.   1. SVT (supraventricular tachycardia)  2. AKI (acute kidney injury) ([place])  3. NSTEMI (non-ST elevated myocardial infarction) [facility])         Cristie Hem, MD 03/04/22 1935

## 2022-03-04 NOTE — Progress Notes (Signed)
ANTICOAGULATION CONSULT NOTE - Initial Consult  Pharmacy Consult for Heparin Indication: chest pain/ACS  Allergies  Allergen Reactions   Doxycycline     Other reaction(s): burning on tongue   Neosporin [Bacitracin-Polymyxin B] Other (See Comments)    Redness   Penicillins Rash    Has patient had a PCN reaction causing immediate rash, facial/tongue/throat swelling, SOB or lightheadedness with hypotension: No Has patient had a PCN reaction causing severe rash involving mucus membranes or skin necrosis: No Has patient had a PCN reaction that required hospitalization: No Has patient had a PCN reaction occurring within the last 10 years: No If all of the above answers are "NO", then may proceed with Cephalosporin use.    Patient Measurements:   Heparin Dosing Weight: 99 kg  Vital Signs: Temp: 97.6 F (36.4 C) (01/20 1436) Temp Source: Tympanic (01/20 1436) BP: 117/106 (01/20 1845) Pulse Rate: 79 (01/20 1845)  Labs: Recent Labs    03/04/22 1430 03/04/22 1734  HGB 13.0  --   HCT 37.6*  --   PLT 265  --   CREATININE 1.34*  --   TROPONINIHS 19* 273*    CrCl cannot be calculated (Unknown ideal weight.).   Medical History: Past Medical History:  Diagnosis Date   Allergic rhinitis, cause unspecified    Anxiety    Aortic valve calcification    Cancer (HCC)    Prostate CA, Dr Jeffie Pollock 22 years ago   Constipation    Depressive disorder    Dyspepsia and other specified disorders of function of stomach    GERD (gastroesophageal reflux disease)    Hypoglycemia    Memory loss    Otitis    externa of right ear   Prostate cancer ([place])    Spinal stenosis    Unspecified pruritic disorder     Medications:  (Not in a hospital admission)  Scheduled:  Infusions:  PRN:   Assessment: 63 yom with a history of 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 Patient is presenting with chest pain, SOB, and palpitations. Heparin per pharmacy  consult placed for chest pain/ACS.  Patient is not on anticoagulation prior to arrival.  Hgb 13; plt 265 hsTrop 19>273  Goal of Therapy:  Heparin level 0.3-0.7 units/ml Monitor platelets by anticoagulation protocol: Yes   Plan:  Give IV heparin 4000 units bolus x 1 Start heparin infusion at 1300 units/hr Check anti-Xa level in 8 hours and daily while on heparin Continue to monitor H&H and platelets  Lorelei Pont, PharmD, BCPS 03/04/2022 7:16 PM ED Clinical Pharmacist -  [phone]

## 2022-03-04 NOTE — ED Notes (Signed)
[facility] is here for transport.

## 2022-03-04 NOTE — ED Notes (Signed)
Patient states he took two 324 ASA earlier today before arrival. 324 ASA not given. MD is aware.

## 2022-03-04 NOTE — Progress Notes (Addendum)
Patient arrived at the unit via EMS ,CHG bath given,vitals monitored,CCMD notified,no complaints of chest pain,pt oriented to the unit.Heparin is going at 13 ml per hour,will continue to monitor,MD made aware.

## 2022-03-05 ENCOUNTER — Observation Stay (HOSPITAL_BASED_OUTPATIENT_CLINIC_OR_DEPARTMENT_OTHER): Payer: PPO

## 2022-03-05 DIAGNOSIS — I350 Nonrheumatic aortic (valve) stenosis: Secondary | ICD-10-CM

## 2022-03-05 DIAGNOSIS — E871 Hypo-osmolality and hyponatremia: Secondary | ICD-10-CM | POA: Insufficient documentation

## 2022-03-05 DIAGNOSIS — I471 Supraventricular tachycardia, unspecified: Secondary | ICD-10-CM

## 2022-03-05 DIAGNOSIS — R7989 Other specified abnormal findings of blood chemistry: Secondary | ICD-10-CM

## 2022-03-05 DIAGNOSIS — N179 Acute kidney failure, unspecified: Secondary | ICD-10-CM | POA: Insufficient documentation

## 2022-03-05 DIAGNOSIS — I351 Nonrheumatic aortic (valve) insufficiency: Secondary | ICD-10-CM | POA: Diagnosis not present

## 2022-03-05 DIAGNOSIS — I340 Nonrheumatic mitral (valve) insufficiency: Secondary | ICD-10-CM

## 2022-03-05 DIAGNOSIS — E872 Acidosis, unspecified: Secondary | ICD-10-CM | POA: Insufficient documentation

## 2022-03-05 LAB — BASIC METABOLIC PANEL
Anion gap: 9 (ref 5–15)
BUN: 14 mg/dL (ref 8–23)
CO2: 22 mmol/L (ref 22–32)
Calcium: 8.7 mg/dL — ABNORMAL LOW (ref 8.9–10.3)
Chloride: 106 mmol/L (ref 98–111)
Creatinine, Ser: 1.06 mg/dL (ref 0.61–1.24)
GFR, Estimated: >60 mL/min (ref >60)
Glucose, Bld: 101 mg/dL — ABNORMAL HIGH (ref 70–99)
Potassium: 3.9 mmol/L (ref 3.5–5.1)
Sodium: 137 mmol/L (ref 135–145)

## 2022-03-05 LAB — TSH: TSH: 1.526 u[IU]/mL (ref 0.350–4.500)

## 2022-03-05 LAB — ECHOCARDIOGRAM COMPLETE
AR max vel: 1.09 cm2
AV Area VTI: 1.12 cm2
AV Area mean vel: 0.97 cm2
AV Mean grad: 22 mmHg
AV Peak grad: 30.8 mmHg
Ao pk vel: 2.78 m/s
Area-P 1/2: 2.73 cm2
MV VTI: 1.92 cm2
P 1/2 time: 758 msec
S' Lateral: 3.6 cm

## 2022-03-05 LAB — TROPONIN I (HIGH SENSITIVITY): Troponin I (High Sensitivity): 270 ng/L (ref <18)

## 2022-03-05 LAB — HEPARIN LEVEL (UNFRACTIONATED): Heparin Unfractionated: 0.82 IU/mL — ABNORMAL HIGH (ref 0.30–0.70)

## 2022-03-05 MED ORDER — METOPROLOL SUCCINATE ER 25 MG PO TB24
25.0000 mg | ORAL_TABLET | Freq: Every day | ORAL | Status: DC
  Administered 2022-03-05: 25 mg via ORAL
  Filled 2022-03-05 (×2): qty 1

## 2022-03-05 MED ORDER — ACETAMINOPHEN 325 MG PO TABS: 650.0000 mg | ORAL_TABLET | Freq: Four times a day (QID) | ORAL | Status: DC | PRN

## 2022-03-05 MED ORDER — METOPROLOL SUCCINATE ER 25 MG PO TB24: 25.0000 mg | ORAL_TABLET | Freq: Every day | ORAL | 0 refills | Status: DC

## 2022-03-05 MED ORDER — MELATONIN 5 MG PO TABS
5.0000 mg | ORAL_TABLET | Freq: Every evening | ORAL | Status: DC | PRN
  Administered 2022-03-05: 5 mg via ORAL
  Filled 2022-03-05: qty 1

## 2022-03-05 MED ORDER — ACETAMINOPHEN 650 MG RE SUPP: 650.0000 mg | Freq: Four times a day (QID) | RECTAL | Status: DC | PRN

## 2022-03-05 MED ORDER — METOPROLOL TARTRATE 12.5 MG HALF TABLET
12.5000 mg | ORAL_TABLET | Freq: Two times a day (BID) | ORAL | Status: DC
  Administered 2022-03-05 (×2): 12.5 mg via ORAL
  Filled 2022-03-05 (×2): qty 1

## 2022-03-05 MED ORDER — SODIUM CHLORIDE 0.9 % IV SOLN: INTRAVENOUS | Status: DC

## 2022-03-05 NOTE — H&P (Signed)
History and Physical    ROLANDO HESSLING YHC:[id number] DOB: Dec 30, 1945 DOA: 03/04/2022  PCP: Lawerance Cruel, MD  Patient coming from: [facility] ED  Chief Complaint: Chest pain  HPI: Logan Wolfe is a 77 y.o. male with medical history significant of GERD, anxiety, depression, history of prostate cancer, spinal stenosis, aortic and mitral valve regurgitation presented to [facility] ED with chest pain, palpitations, and shortness of breath.  He took full dose aspirin prior to EMS arrival.  EKG showing SVT, rate in the 180s.  Vagal maneuvers attempted without success and patient was given adenosine with conversion to sinus tachycardia.  Labs showing no leukocytosis, sodium 134, bicarb 19, glucose 160, creatinine 1.3 (baseline 0.8-0.9), magnesium 2.1, BNP 169, D-dimer negative, troponin 19> 273.  Chest x-ray negative for acute finding. ED physician discussed the case with Dr. Vickki Muff with cardiology who recommended starting IV heparin due to concern for possible NSTEMI.  Patient states today around 2 PM he was sitting on the toilet when all of a sudden his heart started racing and he started having shortness of breath.  He was breathing very fast.  He denies having any related chest pain or discomfort.  States he has had similar episodes in the past which usually last about a minute but today this lasted much longer.  Symptoms resolved after he received adenosine.  He does not drink any energy drinks or heavily caffeinated beverages.  He was feeling well prior to this episode today and was not having any fevers, cough, shortness of breath, nausea, vomiting, abdominal pain, diarrhea, or urinary symptoms.  He does not take any cardiac medications at home.  Review of Systems:  ROS  Past Medical History:  Diagnosis Date   Allergic rhinitis, cause unspecified    Anxiety    Aortic valve calcification    Cancer (HCC)    Prostate CA, Dr Jeffie Pollock 22 years ago   Constipation    Depressive disorder     Dyspepsia and other specified disorders of function of stomach    GERD (gastroesophageal reflux disease)    Hypoglycemia    Memory loss    Otitis    externa of right ear   Prostate cancer ([place])    Spinal stenosis    Unspecified pruritic disorder     Past Surgical History:  Procedure Laterality Date   PROSTATE SURGERY       reports that he has been smoking cigars. He has never used smokeless tobacco. He reports current alcohol use. He reports that he does not use drugs.  Allergies  Allergen Reactions   Doxycycline     Other reaction(s): burning on tongue   Neosporin [Bacitracin-Polymyxin B] Other (See Comments)    Redness   Penicillins Rash    Has patient had a PCN reaction causing immediate rash, facial/tongue/throat swelling, SOB or lightheadedness with hypotension: No Has patient had a PCN reaction causing severe rash involving mucus membranes or skin necrosis: No Has patient had a PCN reaction that required hospitalization: No Has patient had a PCN reaction occurring within the last 10 years: No If all of the above answers are "NO", then may proceed with Cephalosporin use.    Family History  Problem Relation Age of Onset   Hyperlipidemia Mother    Diabetes Mother    Skin cancer Father        part of right ear removed   Prostate cancer Brother    Breast cancer Neg Hx    Colon cancer  Neg Hx    Pancreatic cancer Neg Hx     Prior to Admission medications   Medication Sig Start Date End Date Taking? Authorizing Provider  fluticasone (FLONASE) 50 MCG/ACT nasal spray 1 spray in each nostril    [provider]  Melatonin 10 MG SUBL 1 tablet at bedtime as needed with food    [provider]  meloxicam (MOBIC) 15 MG tablet Take 15 mg by mouth daily. 08/26/21   [provider]    Physical Exam: Vitals:   03/04/22 1915 03/04/22 2000 03/04/22 2200 03/04/22 2323  BP: 131/71 120/74  106/65  Pulse: 75 79  76  Resp: '17 14  18  '$ Temp:  97.8 F (36.6  C) 98.6 F (37 C) 98.6 F (37 C)  TempSrc:   Oral Oral  SpO2: 99% 97%  94%    Physical Exam Vitals reviewed.  Constitutional:      General: He is not in acute distress. HENT:     Head: Normocephalic and atraumatic.  Eyes:     Extraocular Movements: Extraocular movements intact.  Cardiovascular:     Rate and Rhythm: Normal rate and regular rhythm.     Pulses: Normal pulses.     Heart sounds: Murmur heard.  Pulmonary:     Effort: Pulmonary effort is normal. No respiratory distress.     Breath sounds: Normal breath sounds. No wheezing or rales.  Abdominal:     General: Bowel sounds are normal. There is no distension.     Palpations: Abdomen is soft.     Tenderness: There is no abdominal tenderness.  Musculoskeletal:     Cervical back: Normal range of motion.     Right lower leg: No edema.     Left lower leg: No edema.  Skin:    General: Skin is warm and dry.  Neurological:     General: No focal deficit present.     Mental Status: He is alert and oriented to person, place, and time.     Labs on Admission: I have personally reviewed following labs and imaging studies  CBC: Recent Labs  Lab 03/04/22 1430  WBC 8.8  NEUTROABS 5.2  HGB 13.0  HCT 37.6*  MCV 96.9  PLT 419   Basic Metabolic Panel: Recent Labs  Lab 03/04/22 1430  NA 134*  K 3.7  CL 100  CO2 19*  GLUCOSE 160*  BUN 19  CREATININE 1.34*  CALCIUM 9.0  MG 2.1   GFR: CrCl cannot be calculated (Unknown ideal weight.). Liver Function Tests: No results for input(s): "AST", "ALT", "ALKPHOS", "BILITOT", "PROT", "ALBUMIN" in the last 168 hours. No results for input(s): "LIPASE", "AMYLASE" in the last 168 hours. No results for input(s): "AMMONIA" in the last 168 hours. Coagulation Profile: No results for input(s): "INR", "PROTIME" in the last 168 hours. Cardiac Enzymes: No results for input(s): "CKTOTAL", "CKMB", "CKMBINDEX", "TROPONINI" in the last 168 hours. BNP (last 3 results) No results for  input(s): "PROBNP" in the last 8760 hours. HbA1C: No results for input(s): "HGBA1C" in the last 72 hours. CBG: Recent Labs  Lab 03/04/22 1410  GLUCAP 170*   Lipid Profile: No results for input(s): "CHOL", "HDL", "LDLCALC", "TRIG", "CHOLHDL", "LDLDIRECT" in the last 72 hours. Thyroid Function Tests: No results for input(s): "TSH", "T4TOTAL", "FREET4", "T3FREE", "THYROIDAB" in the last 72 hours. Anemia Panel: No results for input(s): "VITAMINB12", "FOLATE", "FERRITIN", "TIBC", "IRON", "RETICCTPCT" in the last 72 hours. Urine analysis:    Component Value Date/Time   COLORURINE  YELLOW 10/18/2008 1519   APPEARANCEUR CLOUDY (A) 10/18/2008 1519   LABSPEC 1.024 10/18/2008 1519   PHURINE 7.5 10/18/2008 1519   GLUCOSEU NEGATIVE 10/18/2008 1519   HGBUR NEGATIVE 10/18/2008 1519   BILIRUBINUR NEGATIVE 10/18/2008 1519   KETONESUR NEGATIVE 10/18/2008 1519   PROTEINUR NEGATIVE 10/18/2008 1519   UROBILINOGEN 0.2 10/18/2008 1519   NITRITE NEGATIVE 10/18/2008 1519   LEUKOCYTESUR  10/18/2008 1519    NEGATIVE MICROSCOPIC NOT DONE ON URINES WITH NEGATIVE PROTEIN, BLOOD, LEUKOCYTES, NITRITE, OR GLUCOSE <1000 mg/dL.    Radiological Exams on Admission: DG Chest Portable 1 View  Result Date: 03/04/2022 CLINICAL DATA:  Chest pain, shortness of breath for 2-3 hours, occasional smoker, history GERD EXAM: PORTABLE CHEST 1 VIEW COMPARISON:  Portable exam 1515 hours compared to 02/19/2017 FINDINGS: External pacing leads project over chest. Normal heart size, mediastinal contours, and pulmonary vascularity. Atherosclerotic calcification aorta. Lungs clear. No pulmonary infiltrate, pleural effusion, or pneumothorax. No acute osseous findings. IMPRESSION: No acute abnormalities. Aortic Atherosclerosis (ICD10-I70.0). Electronically Signed   By: Lavonia Dana M.D.   On: 03/04/2022 15:46    EKG: Independently reviewed.  Initial EKG showing SVT.  Repeat EKG showing sinus rhythm and no significant change since prior  tracing from July 2023.  Assessment and Plan  SVT Rate initially in the 180s and converted to sinus rhythm after adenosine.  Currently in sinus rhythm with rate in the 70s and asymptomatic.  Patient does report history of similar episodes of palpitations in the past.  No signs of infection.  PE less likely given negative D-dimer and no hypoxia.  Magnesium 2.1. -Cardiac monitoring -Check TSH -I spoke to Dr. Vickki Muff with cardiology.  Patient's most recent blood pressure is 106/65.  He recommends starting metoprolol 12.5 mg twice daily.  He is not able to place the patient on cardiology rounding team list and recommends that daytime hospitalist contact cardiology team directly.  Elevated troponin Could be due to demand ischemia in the setting of SVT but cardiology concerned about possible NSTEMI.  Troponin 19> 273.  EKG without acute ischemic changes.  Patient denies having any chest pain or discomfort when he went into SVT and even now. -Cardiac monitoring -Continue IV heparin -Patient took full dose aspirin prior to EMS arrival. -Echocardiogram -Trend troponin  Mild AKI Creatinine 1.3, baseline 0.8-0.9. -Gentle IV fluid hydration.  Last echo done in July 2023 was showing preserved EF but grade 2 diastolic dysfunction.  BNP 169.  Monitor volume status closely. -Repeat BMP in a.m. -Avoid nephrotoxic agents  Mild metabolic acidosis Likely due to AKI.  Bicarb 19. -Continue IV fluid hydration and repeat BMP in a.m. Replace bicarb if metabolic acidosis worsens.  Mild hyponatremia Sodium 134. -Continue gentle IV fluid hydration and repeat BMP in a.m.  Aortic and mitral valve regurgitation Last echo in July showing mild to moderate aortic regurgitation and mild mitral regurgitation. -Repeat echocardiogram  DVT prophylaxis: IV heparin gtt Code Status: Full Code (discussed with the patient) Level of care: Telemetry bed Admission status: It is my clinical opinion that referral for  OBSERVATION is reasonable and necessary in this patient based on the above information provided. The aforementioned taken together are felt to place the patient at high risk for further clinical deterioration. However, it is anticipated that the patient may be medically stable for discharge from the hospital within 24 to 48 hours.   Shela Leff MD [facility]  If 7PM-7AM, please contact night-coverage [email or web address]  03/05/2022, 12:09 AM

## 2022-03-05 NOTE — Discharge Summary (Signed)
Physician Discharge Summary  Logan Wolfe HYW:[id number] DOB: 1945-12-14 DOA: 03/04/2022  PCP: Lawerance Cruel, MD  Admit date: 03/04/2022 Discharge date: 03/05/2022 Discharging to: Home   Consults:  cardiology Procedures:  2 D eCHO   Discharge Diagnoses:   Principal Problem:   SVT (supraventricular tachycardia) Active Problems:   Elevated troponin   AKI (acute kidney injury) ([place])   Metabolic acidosis   Hyponatremia   Aortic stenosis- moderate   Mitral valve regurgitation- mild     Hospital Course:  77 y/o male with MVR, prostate CA, anxiety/depression, GERD presented to the ED when he noticed sudden onset palpitations and shortness of breath and checked a pulse ox at home which showed a heart rate in the 170s. 12 mg of adenosine given in the ED with SVT converting to sinus tach at 100 - 110.   Troponin noted to rise from 19to 273.  Heparin infusion started  Principal Problem:   SVT (supraventricular tachycardia) Moderate aortic stenosis, mild mitral regurgitation -Status post chemical cardioversion with adenosine-started on metoprolol -TSH 1.5-6 - 2D echo shows chronic mild MR and moderate AS which I have discussed with the patient - Toprol 25 mg daily being started and needs to be continued-the patient is allowed to take an extra half tab if he feels palpitations -I have explained to him the side effects of Toprol and have also asked him to discuss it when he picks it up from the pharmacy with the pharmacist.  Active Problems:   Elevated troponin - Troponin 19, 273 and 270 -demand ischemia secondary to above    AKI (acute kidney injury) ([place]) -Creatinine was 1.34 - Has improved to 4.85    Metabolic acidosis -Bicarb was 19 and has improved to 22    Hyponatremia -Sodium was only mildly low at 134 and has improved to 137         Discharge Instructions  Discharge Instructions     Diet - low sodium heart healthy   Complete by: As directed    Increase  activity slowly   Complete by: As directed       Allergies as of 03/05/2022       Reactions   Doxycycline    Other reaction(s): burning on tongue   Neosporin [bacitracin-polymyxin B] Other (See Comments)   Redness   Penicillins Rash   Has patient had a PCN reaction causing immediate rash, facial/tongue/throat swelling, SOB or lightheadedness with hypotension: No Has patient had a PCN reaction causing severe rash involving mucus membranes or skin necrosis: No Has patient had a PCN reaction that required hospitalization: No Has patient had a PCN reaction occurring within the last 10 years: No If all of the above answers are "NO", then may proceed with Cephalosporin use.        Medication List     TAKE these medications    fluticasone 50 MCG/ACT nasal spray Commonly known as: FLONASE 1 spray in each nostril   Melatonin 10 MG Subl 1 tablet at bedtime as needed with food   meloxicam 15 MG tablet Commonly known as: MOBIC Take 15 mg by mouth daily.   metoprolol succinate 25 MG 24 hr tablet Commonly known as: TOPROL-XL Take 1 tablet (25 mg total) by mouth daily. Start taking on: March 06, 2022        Follow-up Information     Elgie Collard, [place] Follow up on 03/13/2022.   Specialty: Cardiology Why: 10:55 am PA for Dr. Carlis Stable information: [id number] [place] [address] [phone]                    The results of significant diagnostics from this hospitalization (including imaging, microbiology, ancillary and laboratory) are listed below for reference.    ECHOCARDIOGRAM COMPLETE  Result Date: 03/05/2022    ECHOCARDIOGRAM REPORT   Patient Name:   Logan Wolfe Date of Exam: 03/05/2022 Medical Rec #:  [id number]      Height:       74.0 in Accession #:    [id number]     Weight:       224.0 lb Date of Birth:  1946-01-05      BSA:          2.281 m Patient Age:    77 years       BP:           121/75 mmHg Patient Gender: M               HR:           56 bpm. Exam Location:  Inpatient Procedure: 2D Echo, Cardiac Doppler and Color Doppler Indications:    Elevated troponin  History:        Patient has prior history of Echocardiogram examinations, most                 recent 09/02/2021. Aortic Valve Disease; Signs/Symptoms:Chest                 Pain and Shortness of Breath. GERD. Palpitations.  Sonographer:    Clayton Lefort RDCS (AE) Referring Phys: Wandra Feinstein RATHORE IMPRESSIONS  1. Left ventricular ejection fraction, by estimation, is 55 to 60%. The left ventricle has normal function. The left ventricle has no regional wall motion abnormalities. There is moderate left ventricular hypertrophy. Left ventricular diastolic parameters are consistent with Grade II diastolic dysfunction (pseudonormalization).  2. Right ventricular systolic function is normal. The right ventricular size is normal.  3. Left atrial size was mildly dilated.  4. The mitral valve is degenerative. Mild mitral valve regurgitation. No evidence of mitral stenosis. The mean mitral valve gradient is 2.0 mmHg. Moderate mitral annular calcification.  5. The aortic valve is calcified. There is severe calcifcation of the aortic valve. There is severe thickening of the aortic valve. Aortic valve regurgitation is mild. Moderate aortic valve stenosis. Aortic regurgitation PHT measures 758 msec. Aortic valve area, by VTI measures 1.12 cm. Aortic valve mean gradient measures 22.0 mmHg. Aortic valve Vmax measures 2.78 m/s.  6. The inferior vena cava is dilated in size with <50% respiratory variability, suggesting right atrial pressure of 15 mmHg. Comparison(s): No significant change from prior study. Prior images reviewed side by side. FINDINGS  Left Ventricle: Left ventricular ejection fraction, by estimation, is 55 to 60%. The left ventricle has normal function. The left ventricle has no regional wall motion abnormalities. The left ventricular internal cavity size was normal in size. There is   moderate left ventricular hypertrophy. Left ventricular diastolic parameters are consistent with Grade II diastolic dysfunction (pseudonormalization). Right Ventricle: The right ventricular size is normal. No increase in right ventricular wall thickness. Right ventricular systolic function is normal. Left Atrium: Left atrial size was mildly dilated. Right Atrium: Right atrial size was normal in size. Pericardium: There is no evidence of pericardial effusion. Mitral Valve: The mitral valve is degenerative in appearance. There is mild thickening of the mitral valve leaflet(s). There is moderate calcification of the  mitral valve leaflet(s). Moderate mitral annular calcification. Mild mitral valve regurgitation.  No evidence of mitral valve stenosis. MV peak gradient, 6.0 mmHg. The mean mitral valve gradient is 2.0 mmHg. Tricuspid Valve: The tricuspid valve is normal in structure. Tricuspid valve regurgitation is trivial. No evidence of tricuspid stenosis. Aortic Valve: The aortic valve is calcified. There is severe calcifcation of the aortic valve. There is severe thickening of the aortic valve. Aortic valve regurgitation is mild. Aortic regurgitation PHT measures 758 msec. Moderate aortic stenosis is present. Aortic valve mean gradient measures 22.0 mmHg. Aortic valve peak gradient measures 30.8 mmHg. Aortic valve area, by VTI measures 1.12 cm. Pulmonic Valve: The pulmonic valve was normal in structure. Pulmonic valve regurgitation is not visualized. No evidence of pulmonic stenosis. Aorta: The aortic root is normal in size and structure. Venous: The inferior vena cava is dilated in size with less than 50% respiratory variability, suggesting right atrial pressure of 15 mmHg. IAS/Shunts: No atrial level shunt detected by color flow Doppler.  LEFT VENTRICLE PLAX 2D LVIDd:         4.80 cm   Diastology LVIDs:         3.60 cm   LV e' medial:    5.03 cm/s LV PW:         1.40 cm   LV E/e' medial:  23.9 LV IVS:        1.20  cm   LV e' lateral:   7.28 cm/s LVOT diam:     2.00 cm   LV E/e' lateral: 16.5 LV SV:         80 LV SV Index:   35 LVOT Area:     3.14 cm  RIGHT VENTRICLE             IVC RV Basal diam:  3.30 cm     IVC diam: 2.30 cm RV S prime:     12.50 cm/s TAPSE (M-mode): 1.9 cm LEFT ATRIUM             Index        RIGHT ATRIUM           Index LA diam:        3.40 cm 1.49 cm/m   RA Area:     19.00 cm LA Vol (A2C):   73.1 ml 32.05 ml/m  RA Volume:   55.30 ml  24.25 ml/m LA Vol (A4C):   60.5 ml 26.53 ml/m LA Biplane Vol: 69.2 ml 30.34 ml/m  AORTIC VALVE AV Area (Vmax):    1.09 cm AV Area (Vmean):   0.97 cm AV Area (VTI):     1.12 cm AV Vmax:           277.67 cm/s AV Vmean:          216.000 cm/s AV VTI:            0.712 m AV Peak Grad:      30.8 mmHg AV Mean Grad:      22.0 mmHg LVOT Vmax:         96.20 cm/s LVOT Vmean:        67.000 cm/s LVOT VTI:          0.254 m LVOT/AV VTI ratio: 0.36 AI PHT:            758 msec  AORTA Ao Root diam: 3.30 cm Ao Asc diam:  3.30 cm MITRAL VALVE MV Area (PHT): 2.73 cm     SHUNTS MV Area VTI:  1.92 cm     Systemic VTI:  0.25 m MV Peak grad:  6.0 mmHg     Systemic Diam: 2.00 cm MV Mean grad:  2.0 mmHg MV Vmax:       1.22 m/s MV Vmean:      63.5 cm/s MV Decel Time: 278 msec MV E velocity: 120.00 cm/s MV A velocity: 107.00 cm/s MV E/A ratio:  1.12 Candee Furbish MD Electronically signed by Candee Furbish MD Signature Date/Time: 03/05/2022/11:02:24 AM    Final    DG Chest Portable 1 View  Result Date: 03/04/2022 CLINICAL DATA:  Chest pain, shortness of breath for 2-3 hours, occasional smoker, history GERD EXAM: PORTABLE CHEST 1 VIEW COMPARISON:  Portable exam 1515 hours compared to 02/19/2017 FINDINGS: External pacing leads project over chest. Normal heart size, mediastinal contours, and pulmonary vascularity. Atherosclerotic calcification aorta. Lungs clear. No pulmonary infiltrate, pleural effusion, or pneumothorax. No acute osseous findings. IMPRESSION: No acute abnormalities. Aortic  Atherosclerosis (ICD10-I70.0). Electronically Signed   By: Lavonia Dana M.D.   On: 03/04/2022 15:46   Labs:   Basic Metabolic Panel: Recent Labs  Lab 03/04/22 1430 03/05/22 0120  NA 134* 137  K 3.7 3.9  CL 100 106  CO2 19* 22  GLUCOSE 160* 101*  BUN 19 14  CREATININE 1.34* 1.06  CALCIUM 9.0 8.7*  MG 2.1  --      CBC: Recent Labs  Lab 03/04/22 1430  WBC 8.8  NEUTROABS 5.2  HGB 13.0  HCT 37.6*  MCV 96.9  PLT 265         SIGNED:   Debbe Odea, MD  [facility] 03/05/2022, 3:58 PM

## 2022-03-05 NOTE — Plan of Care (Signed)
  Problem: Clinical Measurements: Goal: Ability to maintain clinical measurements within normal limits will improve Outcome: Adequate for Discharge Goal: Will remain free from infection Outcome: Adequate for Discharge Goal: Diagnostic test results will improve Outcome: Adequate for Discharge Goal: Respiratory complications will improve Outcome: Adequate for Discharge Goal: Cardiovascular complication will be avoided Outcome: Adequate for Discharge   Problem: Health Behavior/Discharge Planning: Goal: Ability to manage health-related needs will improve Outcome: Adequate for Discharge   Problem: Elimination: Goal: Will not experience complications related to bowel motility Outcome: Adequate for Discharge Goal: Will not experience complications related to urinary retention Outcome: Adequate for Discharge   Problem: Coping: Goal: Level of anxiety will decrease Outcome: Adequate for Discharge   Problem: Pain Managment: Goal: General experience of comfort will improve Outcome: Adequate for Discharge   Problem: Safety: Goal: Ability to remain free from injury will improve Outcome: Adequate for Discharge   Problem: Skin Integrity: Goal: Risk for impaired skin integrity will decrease Outcome: Adequate for Discharge

## 2022-03-05 NOTE — Progress Notes (Signed)
Per cardiology, OK to give Metoprolol '25mg'$  with HR and BP as noted in vital signs.

## 2022-03-05 NOTE — Progress Notes (Signed)
ANTICOAGULATION CONSULT NOTE -   Pharmacy Consult for Heparin Indication: chest pain/ACS  Allergies  Allergen Reactions   Doxycycline     Other reaction(s): burning on tongue   Neosporin [Bacitracin-Polymyxin B] Other (See Comments)    Redness   Penicillins Rash    Has patient had a PCN reaction causing immediate rash, facial/tongue/throat swelling, SOB or lightheadedness with hypotension: No Has patient had a PCN reaction causing severe rash involving mucus membranes or skin necrosis: No Has patient had a PCN reaction that required hospitalization: No Has patient had a PCN reaction occurring within the last 10 years: No If all of the above answers are "NO", then may proceed with Cephalosporin use.    Patient Measurements:   Heparin Dosing Weight: 99 kg  Vital Signs: Temp: 97.9 F (36.6 C) (01/21 0315) Temp Source: Oral (01/21 0315) BP: 104/61 (01/21 0315) Pulse Rate: 80 (01/21 0150)  Labs: Recent Labs    03/04/22 1430 03/04/22 1734 03/05/22 0120  HGB 13.0  --   --   HCT 37.6*  --   --   PLT 265  --   --   HEPARINUNFRC  --   --  0.82*  CREATININE 1.34*  --  1.06  TROPONINIHS 19* 273* 270*     CrCl cannot be calculated (Unknown ideal weight.).   Medical History: Past Medical History:  Diagnosis Date   Allergic rhinitis, cause unspecified    Anxiety    Aortic valve calcification    Cancer (HCC)    Prostate CA, Dr Jeffie Pollock 22 years ago   Constipation    Depressive disorder    Dyspepsia and other specified disorders of function of stomach    GERD (gastroesophageal reflux disease)    Hypoglycemia    Memory loss    Otitis    externa of right ear   Prostate cancer ([place])    Spinal stenosis    Unspecified pruritic disorder     Medications:  Medications Prior to Admission  Medication Sig Dispense Refill Last Dose   fluticasone (FLONASE) 50 MCG/ACT nasal spray 1 spray in each nostril      Melatonin 10 MG SUBL 1 tablet at bedtime as needed with food       meloxicam (MOBIC) 15 MG tablet Take 15 mg by mouth daily.       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 Patient is presenting with chest pain, SOB, and palpitations. Heparin per pharmacy consult placed for chest pain/ACS. Patient is not on anticoagulation prior to arrival.  Initial heparin level above goal: 0.82 following bolus and 1300 units/hr infusion. RN reports no overt s/sx of bleeding.   Goal of Therapy:  Heparin level 0.3-0.7 units/ml Monitor platelets by anticoagulation protocol: Yes   Plan:  Reduce heparin infusion to 1150 units/hr Check anti-Xa level in 8 hours and daily while on heparin Continue to monitor H&H and platelets  Georga Bora, PharmD Clinical Pharmacist 03/05/2022 5:01 AM Please check AMION for all [facility] numbers

## 2022-03-05 NOTE — Progress Notes (Signed)
  Echocardiogram 2D Echocardiogram has been performed.  Logan Wolfe 03/05/2022, 9:54 AM

## 2022-03-05 NOTE — Consult Note (Signed)
Cardiology Consultation   Patient ID: Logan Wolfe MRN: [id number]; DOB: 12-08-1945  Admit date: 03/04/2022 Date of Consult: 03/05/2022  PCP:  Lawerance Cruel, MD   [facility] Prov[facility] Cardiologist:  Larae Grooms, MD        Patient Profile:   Logan Wolfe is a 77 y.o. male with a hx of aortic stenosis moderate and mitral valve regurgitation who is being seen 03/05/2022 for the evaluation of PSVT at the request of Dr. Wynelle Cleveland.  History of Present Illness:   Logan Wolfe 77 year old male with prior history of moderate aortic stenosis and mild mitral valve regurgitation, GERD, anxiety who presented to [place] with palpitations shortness of breath chest discomfort.  Yesterday it was approximately 2 PM when he was using the bathroom when his heart rate suddenly started to race and he felt poor.  Shortness of breath.  Breathing quicker.  In the past he has had similar episodes but usually they are short-lived.  No large intake of caffeine.  Feeling well prior to this situation.  No fevers chills nausea vomiting syncope bleeding.  When EMS arrived EKG showed SVT with heart rates in the 180s.  They tried vagal maneuvers but this was not successful.  He was given adenosine and converted to sinus tachycardia.  Troponin increased from 19-273.  IV heparin was started.  He was observed.  Chest x-ray unremarkable.  Currently laying comfortably in bed, pleasant.  Interestingly he says his wife had a similar condition and had ablation in the past.   Past Medical History:  Diagnosis Date   Allergic rhinitis, cause unspecified    Anxiety    Aortic valve calcification    Cancer ([place])    Prostate CA, Dr Jeffie Pollock 22 years ago   Constipation    Depressive disorder    Dyspepsia and other specified disorders of function of stomach    GERD (gastroesophageal reflux disease)    Hypoglycemia    Memory loss    Otitis    externa of right ear   Prostate cancer ([place])     Spinal stenosis    Unspecified pruritic disorder     Past Surgical History:  Procedure Laterality Date   PROSTATE SURGERY       Home Medications:  Prior to Admission medications   Medication Sig Start Date End Date Taking? Authorizing Provider  fluticasone (FLONASE) 50 MCG/ACT nasal spray 1 spray in each nostril    [provider]  Melatonin 10 MG SUBL 1 tablet at bedtime as needed with food    [provider]  meloxicam (MOBIC) 15 MG tablet Take 15 mg by mouth daily. 08/26/21   [provider]    Inpatient Medications: Scheduled Meds:  metoprolol tartrate  12.5 mg Oral BID   Continuous Infusions:  sodium chloride 75 mL/hr at 03/05/22 0158   heparin 1,150 Units/hr (03/05/22 0508)   PRN Meds: acetaminophen **OR** acetaminophen, melatonin  Allergies:    Allergies  Allergen Reactions   Doxycycline     Other reaction(s): burning on tongue   Neosporin [Bacitracin-Polymyxin B] Other (See Comments)    Redness   Penicillins Rash    Has patient had a PCN reaction causing immediate rash, facial/tongue/throat swelling, SOB or lightheadedness with hypotension: No Has patient had a PCN reaction causing severe rash involving mucus membranes or skin necrosis: No Has patient had a PCN reaction that required hospitalization: No Has patient had a PCN reaction occurring within the last 10 years:  No If all of the above answers are "NO", then may proceed with Cephalosporin use.    Social History:   Social History   Socioeconomic History   Marital status: Married    Spouse name: Rod Holler   Number of children: 2   Years of education: 13   Highest education level: Not on file  Occupational History    Employer: RETIRED  Tobacco Use   Smoking status: Light Smoker    Types: Cigars   Smokeless tobacco: Never  Vaping Use   Vaping Use: Never used  Substance and Sexual Activity   Alcohol use: Yes   Drug use: No   Sexual activity: Not Currently    Partners:  Female  Other Topics Concern   Not on file  Social History Narrative   Patient is married Rod Holler) and lives with his wife.   Patient has 2 children and his wife has 2 children.   Patient is semi-retired.   Patient has a college education.   Patient is right-handed.   Patient drinks one cup of coffee daily.   Social Determinants of Health   Financial Resource Strain: Not on file  Food Insecurity: Not on file  Transportation Needs: Not on file  Physical Activity: Not on file  Stress: Not on file  Social Connections: Not on file  Intimate Partner Violence: Not on file    Family History:    Family History  Problem Relation Age of Onset   Hyperlipidemia Mother    Diabetes Mother    Skin cancer Father        part of right ear removed   Prostate cancer Brother    Breast cancer Neg Hx    Colon cancer Neg Hx    Pancreatic cancer Neg Hx      ROS:  Please see the history of present illness.   All other ROS reviewed and negative.     Physical Exam/Data:   Vitals:   03/04/22 2323 03/05/22 0150 03/05/22 0315 03/05/22 0800  BP: 106/65 (!) 145/75 104/61 121/75  Pulse: 76 80  73  Resp: '18 14 14   '$ Temp: 98.6 F (37 C)  97.9 F (36.6 C) 97.8 F (36.6 C)  TempSrc: Oral  Oral Oral  SpO2: 94% 92% 90% 98%   No intake or output data in the 24 hours ending 03/05/22 0857    08/29/2021    3:21 PM 03/23/2020    8:27 AM 07/11/2019   10:14 AM  Last 3 Weights  Weight (lbs) 224 lb 224 lb 8 oz 215 lb 1.9 oz  Weight (kg) 101.606 kg 101.833 kg 97.578 kg     There is no height or weight on file to calculate BMI.  General:  Well nourished, well developed, in no acute distress HEENT: normal Neck: no JVD Vascular: No carotid bruits; Distal pulses 2+ bilaterally Cardiac:  normal S1, S2; RRR; soft systolic murmur  Lungs:  clear to auscultation bilaterally, no wheezing, rhonchi or rales  Abd: soft, nontender, no hepatomegaly  Ext: no edema Musculoskeletal:  No deformities, BUE and BLE  strength normal and equal Skin: warm and dry  Neuro:  CNs 2-12 intact, no focal abnormalities noted Psych:  Normal affect   EKG:  The EKG was personally reviewed and demonstrates: Original EKG shows heart rate of 176 bpm narrow complex/SVT.  Second EKG shows 117 sinus tachycardia.  30 EKG shows 90s sinus rhythm.  There are no significant ST segment changes.  Telemetry:  Telemetry was personally reviewed  and demonstrates: Sinus rhythm  Relevant CV Studies:  Echocardiogram 09/02/2021:  1. Left ventricular ejection fraction, by estimation, is 60 to 65%. The  left ventricle has normal function. The left ventricle has no regional  wall motion abnormalities. There is mild asymmetric left ventricular  hypertrophy of the basal-septal segment.  Left ventricular diastolic parameters are consistent with Grade II  diastolic dysfunction (pseudonormalization).   2. Right ventricular systolic function is normal. The right ventricular  size is normal.   3. The aortic valve is calcified. Aortic valve regurgitation is mild to  moderate but eccentric (unable to see VC well due to eccentricity).  Moderate aortic valve stenosis. Aortic valve mean gradient measures 23.0  mmHg. DVI 0.32, SVI 35.   4. The mitral valve is degenerative. Mild mitral valve regurgitation.  Mild mitral stenosis. 2D MVA 2.34 cm2 by planimetry, continuity equation  assessment defered in the setting of AI.      The mean mitral valve gradient is 3.2 mmHg with average heart rate of  69 bpm.   Comparison(s): Aortic valve gradients worse that prior.   Laboratory Data:  High Sensitivity Troponin:   Recent Labs  Lab 03/04/22 1430 03/04/22 1734 03/05/22 0120  TROPONINIHS 19* 273* 270*     Chemistry Recent Labs  Lab 03/04/22 1430 03/05/22 0120  NA 134* 137  K 3.7 3.9  CL 100 106  CO2 19* 22  GLUCOSE 160* 101*  BUN 19 14  CREATININE 1.34* 1.06  CALCIUM 9.0 8.7*  MG 2.1  --   GFRNONAA 54* >60  ANIONGAP 15 9    No  results for input(s): "PROT", "ALBUMIN", "AST", "ALT", "ALKPHOS", "BILITOT" in the last 168 hours. Lipids No results for input(s): "CHOL", "TRIG", "HDL", "LABVLDL", "LDLCALC", "CHOLHDL" in the last 168 hours.  Hematology Recent Labs  Lab 03/04/22 1430  WBC 8.8  RBC 3.88*  HGB 13.0  HCT 37.6*  MCV 96.9  MCH 33.5  MCHC 34.6  RDW 13.3  PLT 265   Thyroid  Recent Labs  Lab 03/05/22 0120  TSH 1.526    BNP Recent Labs  Lab 03/04/22 1430  BNP 169.6*    DDimer  Recent Labs  Lab 03/04/22 1430  DDIMER 0.42     Radiology/Studies:  DG Chest Portable 1 View  Result Date: 03/04/2022 CLINICAL DATA:  Chest pain, shortness of breath for 2-3 hours, occasional smoker, history GERD EXAM: PORTABLE CHEST 1 VIEW COMPARISON:  Portable exam 1515 hours compared to 02/19/2017 FINDINGS: External pacing leads project over chest. Normal heart size, mediastinal contours, and pulmonary vascularity. Atherosclerotic calcification aorta. Lungs clear. No pulmonary infiltrate, pleural effusion, or pneumothorax. No acute osseous findings. IMPRESSION: No acute abnormalities. Aortic Atherosclerosis (ICD10-I70.0). Electronically Signed   By: Lavonia Dana M.D.   On: 03/04/2022 15:46     Assessment and Plan:   Paroxysmal supraventricular tachycardia - Broke with adenosine -Agree with metoprolol, would consolidate to 25 mg XL once a day. -Recent echocardiogram in July showed normal EF. -TSH normal. -If SVT returns at home, he may take an additional Toprol-XL, use Valsalva type maneuvers.  If this becomes a recurring theme, we will have him set up with electrophysiology for ablation.  Explained to him the pathophysiology.  Elevated troponin/type 2 MI-troponin elevation 200 - In the setting of SVT heart rate 176 bpm with concomitant shortness of breath and chest pain.  This is a type 2 myocardial infarction.  This is what we used to call demand ischemia.  This is not acute  coronary syndrome.  No need for cardiac  rehabilitation antiplatelets etc.  Prior stress test in 2019 was overall low risk.  Obviously if symptoms of chest discomfort occur with exertional activities, further workup for possible flow-limiting coronary artery disease should take place as outpatient. -Okay to stop IV heparin.  Moderate aortic stenosis - Echocardiogram reviewed as above.  Murmur appreciated.  Nonsurgical at this point.  Okay with discharge. We will have follow-up in clinic.  Dr. Irish Lack.    Risk Assessment/Risk Scores:           For questions or updates, please contact [facility] Please consult [email or web address] for contact info under    Signed, Candee Furbish, MD  03/05/2022 8:57 AM

## 2022-03-09 ENCOUNTER — Ambulatory Visit (HOSPITAL_COMMUNITY)
Admission: RE | Admit: 2022-03-09 | Discharge: 2022-03-09 | Disposition: A | Discharge status: Home / Self Care | Payer: PPO | Source: Ambulatory Visit | Attending: Urology | Admitting: Urology

## 2022-03-09 DIAGNOSIS — N200 Calculus of kidney: Secondary | ICD-10-CM | POA: Diagnosis not present

## 2022-03-09 DIAGNOSIS — D3502 Benign neoplasm of left adrenal gland: Secondary | ICD-10-CM | POA: Diagnosis not present

## 2022-03-09 DIAGNOSIS — D3501 Benign neoplasm of right adrenal gland: Secondary | ICD-10-CM | POA: Diagnosis not present

## 2022-03-09 DIAGNOSIS — I2510 Atherosclerotic heart disease of native coronary artery without angina pectoris: Secondary | ICD-10-CM | POA: Diagnosis not present

## 2022-03-09 DIAGNOSIS — C775 Secondary and unspecified malignant neoplasm of intrapelvic lymph nodes: Secondary | ICD-10-CM

## 2022-03-09 DIAGNOSIS — C61 Malignant neoplasm of prostate: Secondary | ICD-10-CM | POA: Diagnosis not present

## 2022-03-09 MED ORDER — PIFLIFOLASTAT F 18 (PYLARIFY) INJECTION
9.0000 | Freq: Once | INTRAVENOUS | Status: AC
  Administered 2022-03-09: 8.75 via INTRAVENOUS

## 2022-03-12 NOTE — Progress Notes (Deleted)
Office Visit    Patient Name: Logan Wolfe Date of Encounter: 03/12/2022  PCP:  Lawerance Cruel, [place]  Cardiologist:  Larae Grooms, MD  Advanced Practice Provider:  No care team member to display Electrophysiologist:  None   HPI    Logan Wolfe is a 77 y.o. male  with transient confusion for 15 minutes in 2014, could not read certain words and had trouble spelling certain words, prior cerebellar infarct, mild mitral regurgitation, mild aortic stenosis presents today for follow-up appointment.  Over the past year he had rare chest pain episodes of tightness with very strenuous activities last seen 08/29/2021.  These episodes resolved after 5 minutes of rest.  Worse in hot weather.  3 episodes over the past year.  Was walking stairs 8-10 times a day.  He presented to the ED 03/04/2022 with sudden onset of palpitations and shortness of breath.  He checked his pulse ox at home which showed a heart rate in the 170s.  He was given 12 mg of adenosine in the ED with SVT converted to sinus tach 100 to 110 bpm.  Troponin noted to rise from 19-2 33.  Heparin infusion started.  Started on Toprol 25 mg daily.  He was found to have some mild hyponatremia (134) improved to 137 before discharge.  Today, he***  Past Medical History    Past Medical History:  Diagnosis Date   Allergic rhinitis, cause unspecified    Anxiety    Aortic valve calcification    Cancer (HCC)    Prostate CA, Dr Jeffie Pollock 22 years ago   Constipation    Depressive disorder    Dyspepsia and other specified disorders of function of stomach    GERD (gastroesophageal reflux disease)    Hypoglycemia    Memory loss    Otitis    externa of right ear   Prostate cancer ([place])    Spinal stenosis    Unspecified pruritic disorder    Past Surgical History:  Procedure Laterality Date   PROSTATE SURGERY      Allergies  Allergies  Allergen Reactions   Doxycycline     Other  reaction(s): burning on tongue   Neosporin [Bacitracin-Polymyxin B] Other (See Comments)    Redness   Penicillins Rash    Has patient had a PCN reaction causing immediate rash, facial/tongue/throat swelling, SOB or lightheadedness with hypotension: No Has patient had a PCN reaction causing severe rash involving mucus membranes or skin necrosis: No Has patient had a PCN reaction that required hospitalization: No Has patient had a PCN reaction occurring within the last 10 years: No If all of the above answers are "NO", then may proceed with Cephalosporin use.    EKGs/Labs/Other Studies Reviewed:   The following studies were reviewed today: ***  EKG:  EKG is *** ordered today.  The ekg ordered today demonstrates ***  Recent Labs: 03/04/2022: B Natriuretic Peptide 169.6; Hemoglobin 13.0; Magnesium 2.1; Platelets 265 03/05/2022: BUN 14; Creatinine, Ser 1.06; Potassium 3.9; Sodium 137; TSH 1.526  Recent Lipid Panel    Component Value Date/Time   CHOL 210 (H) 12/27/2012 1109   TRIG 142 12/27/2012 1109   HDL 56 12/27/2012 1109   CHOLHDL 3.8 12/27/2012 1109   LDLCALC 126 (H) 12/27/2012 1109    Risk Assessment/Calculations:  {Does this patient have ATRIAL FIBRILLATION?:[id number]}  Home Medications   No outpatient medications have been marked as taking for the 03/13/22 encounter (Appointment) with Harriet Pho,  Terance Hart, PA-C.     Review of Systems   ***   All other systems reviewed and are otherwise negative except as noted above.  Physical Exam    VS:  There were no vitals taken for this visit. , BMI There is no height or weight on file to calculate BMI.  Wt Readings from Last 3 Encounters:  08/29/21 224 lb (101.6 kg)  03/23/20 224 lb 8 oz (101.8 kg)  07/11/19 215 lb 1.9 oz (97.6 kg)     GEN: Well nourished, well developed, in no acute distress. HEENT: normal. Neck: Supple, no JVD, carotid bruits, or masses. Cardiac: ***RRR, no murmurs, rubs, or gallops. No clubbing, cyanosis,  edema.  ***Radials/PT 2+ and equal bilaterally.  Respiratory:  ***Respirations regular and unlabored, clear to auscultation bilaterally. GI: Soft, nontender, nondistended. MS: No deformity or atrophy. Skin: Warm and dry, no rash. Neuro:  Strength and sensation are intact. Psych: Normal affect.  Assessment & Plan    SVT Aortic stenosis Carotid artery disease Hyperlipidemia  No BP recorded.  {Refresh Note OR Click here to enter BP  :1}***      Disposition: Follow up {follow up:15908} with Larae Grooms, MD or APP.  Signed, Elgie Collard, PA-C 03/12/2022, 6:01 PM [facility]

## 2022-03-13 ENCOUNTER — Ambulatory Visit: Payer: PPO | Admitting: Physician Assistant

## 2022-03-13 DIAGNOSIS — I6523 Occlusion and stenosis of bilateral carotid arteries: Secondary | ICD-10-CM

## 2022-03-13 DIAGNOSIS — E782 Mixed hyperlipidemia: Secondary | ICD-10-CM

## 2022-03-13 DIAGNOSIS — I359 Nonrheumatic aortic valve disorder, unspecified: Secondary | ICD-10-CM

## 2022-03-13 DIAGNOSIS — I471 Supraventricular tachycardia, unspecified: Secondary | ICD-10-CM

## 2022-03-16 ENCOUNTER — Ambulatory Visit
Admission: RE | Admit: 2022-03-16 | Discharge: 2022-03-16 | Disposition: A | Discharge status: Home / Self Care | Payer: PPO | Source: Ambulatory Visit | Attending: Urology | Admitting: Urology

## 2022-03-16 ENCOUNTER — Encounter: Payer: Self-pay | Admitting: Urology

## 2022-03-16 ENCOUNTER — Other Ambulatory Visit: Payer: Self-pay

## 2022-03-16 VITALS — BP 130/63 | HR 71 | Temp 96.8°F | Resp 18 | Ht 74.0 in | Wt 228.2 lb

## 2022-03-16 DIAGNOSIS — C61 Malignant neoplasm of prostate: Secondary | ICD-10-CM | POA: Diagnosis not present

## 2022-03-16 DIAGNOSIS — C772 Secondary and unspecified malignant neoplasm of intra-abdominal lymph nodes: Secondary | ICD-10-CM

## 2022-03-16 NOTE — Progress Notes (Signed)
Cardiology Office Note   Date:  03/17/2022   ID:  Logan, Wolfe [birth year], MRN [id number]  PCP:  Lawerance Cruel, MD    No chief complaint on file.  Aortic stenosis  Wt Readings from Last 3 Encounters:  03/17/22 223 lb 3.2 oz (101.2 kg)  03/16/22 228 lb 4 oz (103.5 kg)  08/29/21 224 lb (101.6 kg)       History of Present Illness: Logan Wolfe is a 77 y.o. male   who had transient confusion for 15 minutes in 2014.  He could not read certain words.  He had trouble spelling certain words as well.  He saw Dr. Harrington Challenger who sent him for MRI of his brain. This did not show evidence of recent embolic phenomenon at the time.  He has had a prior cerebellar infarct.    2014 echo showed: Left ventricle: The cavity size was normal. There was mild   focal basal hypertrophy of the septum. Systolic function   was normal. The estimated ejection fraction was in the   range of 60% to 65%. Wall motion was normal; there were no   regional wall motion abnormalities. - Aortic valve: There was mild stenosis by velocities,   although leafelt excursion appearedmoderately restricted.   Trivial regurgitation. - Mitral valve: Calcified annulus. Mildly calcified leaflets   . Mild regurgitation.   Jan 2019: Normal LVEF. Aortic stenosis is still only mild.   2024 echo showed: "Left ventricular ejection fraction, by estimation, is 55 to 60%. The  left ventricle has normal function. The left ventricle has no regional  wall motion abnormalities. There is moderate left ventricular hypertrophy.  Left ventricular diastolic  parameters are consistent with Grade II diastolic dysfunction  (pseudonormalization).   2. Right ventricular systolic function is normal. The right ventricular  size is normal.   3. Left atrial size was mildly dilated.   4. The mitral valve is degenerative. Mild mitral valve regurgitation. No  evidence of mitral stenosis. The mean mitral valve gradient is 2.0 mmHg.  Moderate  mitral annular calcification.   5. The aortic valve is calcified. There is severe calcifcation of the  aortic valve. There is severe thickening of the aortic valve. Aortic valve  regurgitation is mild. Moderate aortic valve stenosis. Aortic  regurgitation PHT measures 758 msec. Aortic  valve area, by VTI measures 1.12 cm. Aortic valve mean gradient measures  22.0 mmHg. Aortic valve Vmax measures 2.78 m/s.   6. The inferior vena cava is dilated in size with <50% respiratory  variability, suggesting right atrial pressure of 15 mmHg. "  Hospitalized in Jan for SVT: "SVT (supraventricular tachycardia) Moderate aortic stenosis, mild mitral regurgitation -Status post chemical cardioversion with adenosine-started on metoprolol -TSH 1.5-6 - 2D echo shows chronic mild MR and moderate AS which I have discussed with the patient - Toprol 25 mg daily being started and needs to be continued-the patient is allowed to take an extra half tab if he feels palpitations -I have explained to him the side effects of Toprol and have also asked him to discuss it when he picks it up from the pharmacy with the pharmacist.   Active Problems:   Elevated troponin - Troponin 19, 273 and 270 -demand ischemia secondary to above     AKI (acute kidney injury) ([place]) -Creatinine was 1.34 - Has improved to 3.49     Metabolic acidosis -Bicarb was 19 and has improved to 22     Hyponatremia -Sodium was  only mildly low at 134 and has improved to 137"  Since leaving the hospital, he is felt well.  Denies : Chest pain. Dizziness. Leg edema. Nitroglycerin use. Orthopnea. Palpitations. Paroxysmal nocturnal dyspnea. Shortness of breath. Syncope.    He has more therapy planned for prostate cancer.  Past Medical History:  Diagnosis Date   Allergic rhinitis, cause unspecified    Anxiety    Aortic valve calcification    Cancer (HCC)    Prostate CA, Dr Jeffie Pollock 22 years ago   Constipation    Depressive disorder     Dyspepsia and other specified disorders of function of stomach    GERD (gastroesophageal reflux disease)    Hypoglycemia    Memory loss    Otitis    externa of right ear   Prostate cancer ([place])    Spinal stenosis    Unspecified pruritic disorder     Past Surgical History:  Procedure Laterality Date   PROSTATE SURGERY       Current Outpatient Medications  Medication Sig Dispense Refill   fluticasone (FLONASE) 50 MCG/ACT nasal spray 1 spray in each nostril     Melatonin 10 MG SUBL 1 tablet at bedtime as needed with food     meloxicam (MOBIC) 15 MG tablet Take 15 mg by mouth daily.     metoprolol succinate (TOPROL-XL) 25 MG 24 hr tablet Take 1 tablet (25 mg total) by mouth daily. 30 tablet 0   No current facility-administered medications for this visit.    Allergies:   Doxycycline, Neosporin [bacitracin-polymyxin b], and Penicillins    Social History:  The patient  reports that he has been smoking cigars. He has never used smokeless tobacco. He reports current alcohol use. He reports that he does not use drugs.   Family History:  The patient's family history includes Diabetes in his mother; Hyperlipidemia in his mother; Prostate cancer in his brother; Skin cancer in his father.    ROS:  Please see the history of present illness.   Otherwise, review of systems are positive for palpitations.   All other systems are reviewed and negative.    PHYSICAL EXAM: VS:  BP 112/62   Pulse 77   Ht '6\' 2"'$  (1.88 m)   Wt 223 lb 3.2 oz (101.2 kg)   SpO2 98%   BMI 28.66 kg/m  , BMI Body mass index is 28.66 kg/m. GEN: Well nourished, well developed, in no acute distress HEENT: normal Neck: no JVD, carotid bruits, or masses Cardiac: RRR; no murmurs, rubs, or gallops,no edema  Respiratory:  clear to auscultation bilaterally, normal work of breathing GI: soft, nontender, nondistended, + BS MS: no deformity or atrophy Skin: warm and dry, no rash Neuro:  Strength and sensation are  intact Psych: euthymic mood, full affect   EKG:   The ekg ordered today demonstrates NSR, no ST changes   Recent Labs: 03/04/2022: B Natriuretic Peptide 169.6; Hemoglobin 13.0; Magnesium 2.1; Platelets 265 03/05/2022: BUN 14; Creatinine, Ser 1.06; Potassium 3.9; Sodium 137; TSH 1.526   Lipid Panel    Component Value Date/Time   CHOL 210 (H) 12/27/2012 1109   TRIG 142 12/27/2012 1109   HDL 56 12/27/2012 1109   CHOLHDL 3.8 12/27/2012 1109   LDLCALC 126 (H) 12/27/2012 1109     Other studies Reviewed: Additional studies/ records that were reviewed today with results demonstrating: hospital records reviewed.   ASSESSMENT AND PLAN:  Aortic stenosis: moderate by Jan 2024 echo.  TOlerated episode of SVT with HR  in [place].  Carotid artery disease: Mild in 2014.  Healthy lifestyle and prevention.  Plant based, high fiber diet recommended.  Hyperlpidemia: LDL 130 in 2023.   SVT: Now on Toprol XL.  Will refill at 50 mg daily.  He does not drink caffeine at all.  If he has recurrent SVT, will refer to EP to consider ablation.  Hopefully, beta-blocker will control his symptoms.   Current medicines are reviewed at length with the patient today.  The patient concerns regarding his medicines were addressed.  The following changes have been made:  No change  Labs/ tests ordered today include:  No orders of the defined types were placed in this encounter.   Recommend 150 minutes/week of aerobic exercise Low fat, low carb, high fiber diet recommended  Disposition:   FU in 6 months   Signed, Larae Grooms, MD  03/17/2022 1:51 PM    [facility] [place], [place],   [address] Phone: [phone]; Fax: [phone]

## 2022-03-16 NOTE — Progress Notes (Signed)
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  I verified patient's identity and began nursing interview. Patient reports doing well. No discomfort a this time.  Meaningful use complete. No urinary management medications currently. Urology appt- 04/19/22 Dr. Jeffie Pollock [facility]  BP 130/63 (BP Location: Left Arm, Patient Position: Sitting, Cuff Size: Normal)   Pulse 71   Temp (!) 96.8 F (36 C) (Temporal)   Resp 18   Ht '6\' 2"'$  (1.88 m)   Wt 228 lb 4 oz (103.5 kg)   SpO2 99%   BMI 29.31 kg/m   This concludes the interview.   Leandra Kern, LPN

## 2022-03-16 NOTE — Progress Notes (Signed)
Radiation Oncology         [phone] ________________________________  Outpatient Follow up New visit  Name: Logan Wolfe MRN: [id number]  Date: 03/16/2022  DOB: August 12, 1945  OM:BTDH, Dwyane Luo, MD  Irine Seal, MD   REFERRING PHYSICIAN: Irine Seal, MD  DIAGNOSIS: 77 y.o. 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    ICD-10-CM   1. 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 male with a diagnosis of recurrent prostate cancer with oligometastatic disease involving two periaortic lymph nodes on the left. In summary, he was initially diagnosed with Gleason 3+3 prostate cancer back in 07/1989 by Dr. Jeffie Wolfe. Given his young age of 77 years old at the time of diagnosis, he opted to proceed with prostatectomy on 09/10/1989, with pathology confirming Gleason 3+3 prostate cancer and benign lymph nodes (0/8). He reportedly had salvage radiation therapy in 1992 and also received a year of Lupron as part of a research study with the last dose given in 09/2008.  He continued with close monitoring of the PSA under the care of Dr. Jeffie Wolfe and his PSA had slowly risen over the years, but his restaging scans had been negative. His PSA rose to 11.5 in 12/2016, prompting an Justin PET scan in 01/2017 that was negative. The PSA reached 21.3 in 07/2019, and he underwent staging CT and bone scan. Again, both scans were negative for any definite metastatic disease or recurrence, and a subsequent PSA decreased to 13.2 in 10/2019. By 01/2020, the PSA was back up to 17.8, so he proceeded with PSMA scan for further evaluation.  This exam was performed on 02/12/2020 and revealed new tracer uptake within the prostate bed as well as  subcentimeter hypermetabolic pelvic lymph nodes in the left posterior pelvis as well as right and left external iliac nodes. There was no evidence of solid organ or osseous metastatic disease.  We met with the patient in consult on 03/23/2020 to discuss the potential role of salvage reirradiation which he elected to proceed with and completed in April 2022.  He tolerated the radiation treatments well and he had an excellent response with the PSA decreasing down to 1.85 in April 2023.  However, since that time, the PSA has been gradually rising at 2.23 in August 2023 and 3.35 on 01/11/2022.  This prompted further evaluation with a repeat PSMA PET scan performed on 03/09/2022 confirming resolution of the activity in the prostatectomy bed and pelvic nodes but new activity in 2 tiny abdominal retroperitoneal nodes in the left periaortic region.  The patient reviewed the lab and imaging results with his urologist and he has kindly been referred back to [place] today for discussion of potential radiation treatment options.   PREVIOUS RADIATION THERAPY: Yes  04/07/20 - 05/31/20:  1. The prostate fossa and pelvic lymph nodes were initially treated to 45 Gy in 25 fractions of 1.8 Gy  2. The prostate fossa and PET positive pelvic nodes were boosted to 71 Gy with 13 additional fractions of 2 Gy    1992: Salvage prostate fossa radiation  PAST MEDICAL HISTORY:  Past Medical History:  Diagnosis Date   Allergic rhinitis, cause unspecified    Anxiety    Aortic valve calcification  Cancer [facility])    Prostate CA, Dr Logan Wolfe 22 years ago   Constipation    Depressive disorder    Dyspepsia and other specified disorders of function of stomach    GERD (gastroesophageal reflux disease)    Hypoglycemia    Memory loss    Otitis    externa of right ear   Prostate cancer ([place])    Spinal stenosis    Unspecified pruritic disorder       PAST SURGICAL HISTORY: Past Surgical History:  Procedure Laterality Date   PROSTATE  SURGERY      FAMILY HISTORY:  Family History  Problem Relation Age of Onset   Hyperlipidemia Mother    Diabetes Mother    Skin cancer Father        part of right ear removed   Prostate cancer Brother    Breast cancer Neg Hx    Colon cancer Neg Hx    Pancreatic cancer Neg Hx     SOCIAL HISTORY:  Social History   Socioeconomic History   Marital status: Married    Spouse name: Logan Wolfe   Number of children: 2   Years of education: 13   Highest education level: Not on file  Occupational History    Employer: RETIRED  Tobacco Use   Smoking status: Light Smoker    Types: Cigars   Smokeless tobacco: Never  Vaping Use   Vaping Use: Never used  Substance and Sexual Activity   Alcohol use: Yes   Drug use: No   Sexual activity: Not Currently    Partners: Female  Other Topics Concern   Not on file  Social History Narrative   Patient is married Logan Wolfe) and lives with his wife.   Patient has 2 children and his wife has 2 children.   Patient is semi-retired.   Patient has a college education.   Patient is right-handed.   Patient drinks one cup of coffee daily.   Social Determinants of Health   Financial Resource Strain: Not on file  Food Insecurity: Not on file  Transportation Needs: Not on file  Physical Activity: Not on file  Stress: Not on file  Social Connections: Not on file  Intimate Partner Violence: Not on file    ALLERGIES: Doxycycline, Neosporin [bacitracin-polymyxin b], and Penicillins  MEDICATIONS:  Current Outpatient Medications  Medication Sig Dispense Refill   fluticasone (FLONASE) 50 MCG/ACT nasal spray 1 spray in each nostril     Melatonin 10 MG SUBL 1 tablet at bedtime as needed with food     meloxicam (MOBIC) 15 MG tablet Take 15 mg by mouth daily.     metoprolol succinate (TOPROL-XL) 25 MG 24 hr tablet Take 1 tablet (25 mg total) by mouth daily. 30 tablet 0   No current facility-administered medications for this encounter.    REVIEW OF SYSTEMS:   On review of systems, the patient reports that he is doing well overall. He denies any chest pain, shortness of breath, cough, fevers, chills, night sweats, unintended weight changes. He denies any bowel disturbances, and denies abdominal pain, nausea or vomiting. He denies any new musculoskeletal or joint aches or pains. His IPSS was 9, indicating mild urinary symptoms. He reports nocturia x3, occasional nighttime urinary urgency, and urinary incontinence. 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 A complete review of systems is obtained and is otherwise negative.    PHYSICAL EXAM:  Wt Readings from Last 3 Encounters:  03/16/22 228 lb 4 oz (103.5 kg)  08/29/21 224 lb (101.6 kg)  03/23/20 224 lb 8 oz (101.8 kg)   Temp Readings from Last 3 Encounters:  03/16/22 (!) 96.8 F (36 C) (Temporal)  03/05/22 (!) 97.3 F (36.3 C) (Oral)  03/23/20 (!) 97 F (36.1 C) (Temporal)   BP Readings from Last 3 Encounters:  03/16/22 130/63  03/05/22 131/66  08/29/21 120/68   Pulse Readings from Last 3 Encounters:  03/16/22 71  03/05/22 66  08/29/21 74   Pain Assessment Pain Score: 0-No pain/10  In general this is a well appearing Caucasian male in no acute distress. He's alert and oriented x4 and appropriate throughout the examination. Cardiopulmonary assessment is negative for acute distress, and he exhibits normal effort.     KPS = 100  100 - Normal; no complaints; no evidence of disease. 90   - Able to carry on normal activity; minor signs or symptoms of disease. 80   - Normal activity with effort; some signs or symptoms of disease. 19   - Cares for self; unable to carry on normal activity or to do active work. 60   - Requires occasional assistance, but is able to care for most of his personal needs. 50   - Requires considerable assistance and frequent medical care. 52   - Disabled; requires special care and assistance. 8   - Severely disabled; hospital admission  is indicated although death not imminent. 26   - Very sick; hospital admission necessary; active supportive treatment necessary. 10   - Moribund; fatal processes progressing rapidly. 0     - Dead  Karnofsky DA, Abelmann [place], Craver LS and Burchenal JH [phone]) The use of the nitrogen mustards in the palliative treatment of carcinoma: with particular reference to bronchogenic carcinoma Cancer 1 [id number]  LABORATORY DATA:  Lab Results  Component Value Date   WBC 8.8 03/04/2022   HGB 13.0 03/04/2022   HCT 37.6 (L) 03/04/2022   MCV 96.9 03/04/2022   PLT 265 03/04/2022   Lab Results  Component Value Date   NA 137 03/05/2022   K 3.9 03/05/2022   CL 106 03/05/2022   CO2 22 03/05/2022   Lab Results  Component Value Date   ALT 32 10/18/2008   AST 33 10/18/2008   ALKPHOS 117 10/18/2008   BILITOT 0.3 10/18/2008     RADIOGRAPHY: NM PET (PSMA) SKULL TO MID THIGH  Result Date: 03/13/2022 CLINICAL DATA:  Prostate cancer with metastasis to pelvic lymph node. Remote prostatectomy. Radiation therapy. EXAM: NUCLEAR MEDICINE PET SKULL BASE TO THIGH TECHNIQUE: 8.8 mCi F18 Piflufolastat (Pylarify) was injected intravenously. Full-ring PET imaging was performed from the skull base to thigh after the radiotracer. CT data was obtained and used for attenuation correction and anatomic localization. COMPARISON:  02/12/2020 FINDINGS: NECK No radiotracer activity in neck lymph nodes. Incidental CT finding: No cervical adenopathy. CHEST No radiotracer accumulation within mediastinal or hilar lymph nodes. No suspicious pulmonary nodules on the CT scan. Incidental CT finding: Aortic valve calcification. Aortic and coronary artery calcification. ABDOMEN/PELVIS Prostate: The previously described tracer affinity within the prostatectomy bed has resolved. Lymph nodes: The pelvic nodal tracer affinity has resolved. A diminutive left periaortic node measures a S.U.V. max of 5.6 on 180/4 versus a S.U.V. max of 2.6 on the prior  exam. A more caudal left para-aortic node, just above the bifurcation, measures 1-2 mm and a S.U.V. max of 4.0 today, similar. Liver: No evidence of liver metastasis. Incidental CT finding: Hepatic cysts of up to 5.1 cm. Fatty  replaced pancreas. Lower pole right renal 2.8 cm low-density lesion is likely a cyst . In the absence of clinically indicated signs/symptoms require(s) no independent follow-up. 3 mm lower pole left renal collecting system calculus. Left greater than right adrenal nodularity including up to 1.4 cm and 8 HU on the left, consistent with adenomas. Abdominal aortic atherosclerosis. Pelvic node dissection. SKELETON No focal activity to suggest skeletal metastasis. IMPRESSION: 1. Mixed response to therapy. 2. Although the prostatectomy bed and pelvic nodal affinity have resolved, there is persistent, slightly increased tracer affinity within tiny abdominal retroperitoneal nodes. 3. No evidence of osseous or distant metastasis. 4. Incidental findings, including: Coronary artery atherosclerosis. Aortic Atherosclerosis (ICD10-I70.0). Left nephrolithiasis. 5. Bilateral adrenal adenomas . In the absence of clinically indicated signs/symptoms require(s) no independent follow-up. 6. Aortic valvular calcifications. Consider echocardiography to evaluate for valvular dysfunction. Electronically Signed   By: Abigail Miyamoto M.D.   On: 03/13/2022 09:12   ECHOCARDIOGRAM COMPLETE  Result Date: 03/05/2022    ECHOCARDIOGRAM REPORT   Patient Name:   Logan Wolfe Date of Exam: 03/05/2022 Medical Rec #:  [id number]      Height:       74.0 in Accession #:    [id number]     Weight:       224.0 lb Date of Birth:  [birth year]      BSA:          2.281 m Patient Age:    38 years       BP:           121/75 mmHg Patient Gender: M              HR:           56 bpm. Exam Location:  Inpatient Procedure: 2D Echo, Cardiac Doppler and Color Doppler Indications:    Elevated troponin  History:        Patient has prior history of  Echocardiogram examinations, most                 recent 09/02/2021. Aortic Valve Disease; Signs/Symptoms:Chest                 Pain and Shortness of Breath. GERD. Palpitations.  Sonographer:    Clayton Lefort RDCS (AE) Referring Phys: Wandra Feinstein RATHORE IMPRESSIONS  1. Left ventricular ejection fraction, by estimation, is 55 to 60%. The left ventricle has normal function. The left ventricle has no regional wall motion abnormalities. There is moderate left ventricular hypertrophy. Left ventricular diastolic parameters are consistent with Grade II diastolic dysfunction (pseudonormalization).  2. Right ventricular systolic function is normal. The right ventricular size is normal.  3. Left atrial size was mildly dilated.  4. The mitral valve is degenerative. Mild mitral valve regurgitation. No evidence of mitral stenosis. The mean mitral valve gradient is 2.0 mmHg. Moderate mitral annular calcification.  5. The aortic valve is calcified. There is severe calcifcation of the aortic valve. There is severe thickening of the aortic valve. Aortic valve regurgitation is mild. Moderate aortic valve stenosis. Aortic regurgitation PHT measures 758 msec. Aortic valve area, by VTI measures 1.12 cm. Aortic valve mean gradient measures 22.0 mmHg. Aortic valve Vmax measures 2.78 m/s.  6. The inferior vena cava is dilated in size with <50% respiratory variability, suggesting right atrial pressure of 15 mmHg. Comparison(s): No significant change from prior study. Prior images reviewed side by side. FINDINGS  Left Ventricle: Left ventricular ejection fraction, by estimation, is 55 to 60%. The left ventricle  has normal function. The left ventricle has no regional wall motion abnormalities. The left ventricular internal cavity size was normal in size. There is  moderate left ventricular hypertrophy. Left ventricular diastolic parameters are consistent with Grade II diastolic dysfunction (pseudonormalization). Right Ventricle: The right  ventricular size is normal. No increase in right ventricular wall thickness. Right ventricular systolic function is normal. Left Atrium: Left atrial size was mildly dilated. Right Atrium: Right atrial size was normal in size. Pericardium: There is no evidence of pericardial effusion. Mitral Valve: The mitral valve is degenerative in appearance. There is mild thickening of the mitral valve leaflet(s). There is moderate calcification of the mitral valve leaflet(s). Moderate mitral annular calcification. Mild mitral valve regurgitation.  No evidence of mitral valve stenosis. MV peak gradient, 6.0 mmHg. The mean mitral valve gradient is 2.0 mmHg. Tricuspid Valve: The tricuspid valve is normal in structure. Tricuspid valve regurgitation is trivial. No evidence of tricuspid stenosis. Aortic Valve: The aortic valve is calcified. There is severe calcifcation of the aortic valve. There is severe thickening of the aortic valve. Aortic valve regurgitation is mild. Aortic regurgitation PHT measures 758 msec. Moderate aortic stenosis is present. Aortic valve mean gradient measures 22.0 mmHg. Aortic valve peak gradient measures 30.8 mmHg. Aortic valve area, by VTI measures 1.12 cm. Pulmonic Valve: The pulmonic valve was normal in structure. Pulmonic valve regurgitation is not visualized. No evidence of pulmonic stenosis. Aorta: The aortic root is normal in size and structure. Venous: The inferior vena cava is dilated in size with less than 50% respiratory variability, suggesting right atrial pressure of 15 mmHg. IAS/Shunts: No atrial level shunt detected by color flow Doppler.  LEFT VENTRICLE PLAX 2D LVIDd:         4.80 cm   Diastology LVIDs:         3.60 cm   LV e' medial:    5.03 cm/s LV PW:         1.40 cm   LV E/e' medial:  23.9 LV IVS:        1.20 cm   LV e' lateral:   7.28 cm/s LVOT diam:     2.00 cm   LV E/e' lateral: 16.5 LV SV:         80 LV SV Index:   35 LVOT Area:     3.14 cm  RIGHT VENTRICLE             IVC RV  Basal diam:  3.30 cm     IVC diam: 2.30 cm RV S prime:     12.50 cm/s TAPSE (M-mode): 1.9 cm LEFT ATRIUM             Index        RIGHT ATRIUM           Index LA diam:        3.40 cm 1.49 cm/m   RA Area:     19.00 cm LA Vol (A2C):   73.1 ml 32.05 ml/m  RA Volume:   55.30 ml  24.25 ml/m LA Vol (A4C):   60.5 ml 26.53 ml/m LA Biplane Vol: 69.2 ml 30.34 ml/m  AORTIC VALVE AV Area (Vmax):    1.09 cm AV Area (Vmean):   0.97 cm AV Area (VTI):     1.12 cm AV Vmax:           277.67 cm/s AV Vmean:          216.000 cm/s AV VTI:  0.712 m AV Peak Grad:      30.8 mmHg AV Mean Grad:      22.0 mmHg LVOT Vmax:         96.20 cm/s LVOT Vmean:        67.000 cm/s LVOT VTI:          0.254 m LVOT/AV VTI ratio: 0.36 AI PHT:            758 msec  AORTA Ao Root diam: 3.30 cm Ao Asc diam:  3.30 cm MITRAL VALVE MV Area (PHT): 2.73 cm     SHUNTS MV Area VTI:   1.92 cm     Systemic VTI:  0.25 m MV Peak grad:  6.0 mmHg     Systemic Diam: 2.00 cm MV Mean grad:  2.0 mmHg MV Vmax:       1.22 m/s MV Vmean:      63.5 cm/s MV Decel Time: 278 msec MV E velocity: 120.00 cm/s MV A velocity: 107.00 cm/s MV E/A ratio:  1.12 Candee Furbish MD Electronically signed by Candee Furbish MD Signature Date/Time: 03/05/2022/11:02:24 AM    Final    DG Chest Portable 1 View  Result Date: 03/04/2022 CLINICAL DATA:  Chest pain, shortness of breath for 2-3 hours, occasional smoker, history GERD EXAM: PORTABLE CHEST 1 VIEW COMPARISON:  Portable exam 1515 hours compared to 02/19/2017 FINDINGS: External pacing leads project over chest. Normal heart size, mediastinal contours, and pulmonary vascularity. Atherosclerotic calcification aorta. Lungs clear. No pulmonary infiltrate, pleural effusion, or pneumothorax. No acute osseous findings. IMPRESSION: No acute abnormalities. Aortic Atherosclerosis (ICD10-I70.0). Electronically Signed   By: Lavonia Dana M.D.   On: 03/04/2022 15:46      IMPRESSION/PLAN: 1. 77 y.o. gentleman with recurrent prostate cancer with  oligometastatic disease involving the retroperitoneal lymph nodes, s/p RRP in 08/1989, salvage prostate fossa radiation in 1992 and repeat salvage fossa/pelvic node radiation in 2022.  Today, we talked to the patient about the findings and workup thus far. We discussed the natural history of oligometastatic recurrent prostate adenocarcinoma and general treatment, highlighting the role of radiotherapy in the management. We discussed the available radiation techniques, and focused on the details and logistics of delivery.  The recommendation is for a 10 fraction course of ultra hypofractionated radiotherapy (UHRT) to the involved left periaortic nodes and associated nodal echelon.  We reviewed the anticipated acute and late sequelae associated with radiation in this setting and the patient was encouraged to ask questions that were answered to his stated satisfaction.  We also discussed the pros and cons of 6 months of ADT in this setting.  Since this is now his third recurrence of disease, we do feel that 6 months of ADT, concurrent with the [facility] would improve the likelihood of getting good durable control of the prostate cancer.  He had a previous negative experience with ADT in the past, particularly with intolerance of the hot flashes, but is willing to proceed with a short course of ADT as recommended.  He does have significant heart disease and therefore, we have recommended consideration for Orgovyx and he will discuss this further with Dr. Jeffie Wolfe.    At the conclusion of our conversation, the patient is interested in moving forward with the recommended ST-ADT concurrent with a 2-week course of ultra hypofractionated radiotherapy (UHRT) to the involved left periaortic nodes and associated nodal echelon.  He appears to have a good understanding of his disease and our treatment recommendations which are of curative intent.  He is in agreement with the stated plan so we will share our discussion with Dr. Jeffie Wolfe  so that he can make arrangements to initiate ADT prior to the start of treatment.  We have tentatively scheduled him for CT simulation/treatment planning at 10 AM on Friday, 03/31/2022, in anticipation of beginning his daily treatment in the near future.  He knows that he is welcome to call at anytime with any questions or concerns in the interim.  We enjoyed meeting him today and look forward to continuing to participate in his care.    Nicholos Johns, PA-C    Tyler Pita, MD  [facility] Direct Dial: [phone]  Fax: [phone] [email or web address]  Skype  LinkedIn

## 2022-03-17 ENCOUNTER — Encounter: Payer: Self-pay | Admitting: Interventional Cardiology

## 2022-03-17 ENCOUNTER — Ambulatory Visit: Payer: HMO | Attending: Physician Assistant | Admitting: Interventional Cardiology

## 2022-03-17 VITALS — BP 112/62 | HR 77 | Ht 74.0 in | Wt 223.2 lb

## 2022-03-17 DIAGNOSIS — C61 Malignant neoplasm of prostate: Secondary | ICD-10-CM | POA: Diagnosis not present

## 2022-03-17 DIAGNOSIS — E782 Mixed hyperlipidemia: Secondary | ICD-10-CM | POA: Diagnosis not present

## 2022-03-17 DIAGNOSIS — I6523 Occlusion and stenosis of bilateral carotid arteries: Secondary | ICD-10-CM | POA: Diagnosis not present

## 2022-03-17 DIAGNOSIS — C772 Secondary and unspecified malignant neoplasm of intra-abdominal lymph nodes: Secondary | ICD-10-CM | POA: Diagnosis not present

## 2022-03-17 DIAGNOSIS — I359 Nonrheumatic aortic valve disorder, unspecified: Secondary | ICD-10-CM | POA: Diagnosis not present

## 2022-03-17 MED ORDER — METOPROLOL SUCCINATE ER 50 MG PO TB24: 50.0000 mg | ORAL_TABLET | Freq: Every day | ORAL | 3 refills | Status: DC

## 2022-03-17 NOTE — Patient Instructions (Addendum)
Medication Instructions:  Your physician has recommended you make the following change in your medication: Increase metoprolol succinate to  50 mg by mouth daily  *If you need a refill on your cardiac medications before your next appointment, please call your pharmacy*   Lab Work: none If you have labs (blood work) drawn today and your tests are completely normal, you will receive your results only by: [place] (if you have MyChart) OR A paper copy in the mail If you have any lab test that is abnormal or we need to change your treatment, we will call you to review the results.   Testing/Procedures: none   Follow-Up: At [facility], you and your health needs are our priority.  As part of our continuing mission to provide you with exceptional heart care, we have created designated Provider Care Teams.  These Care Teams include your primary Cardiologist (physician) and Advanced Practice Providers (APPs -  Physician Assistants and Nurse Practitioners) who all work together to provide you with the care you need, when you need it.  We recommend signing up for the patient portal called "MyChart".  Sign up information is provided on this After Visit Summary.  MyChart is used to connect with patients for Virtual Visits (Telemedicine).  Patients are able to view lab/test results, encounter notes, upcoming appointments, etc.  Non-urgent messages can be sent to your provider as well.   To learn more about what you can do with MyChart, go to [email or web address].    Your next appointment:    October 12, 2022 at 1:20  Provider:   Larae Grooms, MD     Other Instructions

## 2022-03-20 DIAGNOSIS — C61 Malignant neoplasm of prostate: Secondary | ICD-10-CM | POA: Diagnosis not present

## 2022-03-24 DIAGNOSIS — C778 Secondary and unspecified malignant neoplasm of lymph nodes of multiple regions: Secondary | ICD-10-CM | POA: Diagnosis not present

## 2022-03-24 DIAGNOSIS — C61 Malignant neoplasm of prostate: Secondary | ICD-10-CM | POA: Diagnosis not present

## 2022-03-28 DIAGNOSIS — M47816 Spondylosis without myelopathy or radiculopathy, lumbar region: Secondary | ICD-10-CM | POA: Diagnosis not present

## 2022-03-31 ENCOUNTER — Ambulatory Visit
Admission: RE | Admit: 2022-03-31 | Discharge: 2022-03-31 | Disposition: A | Discharge status: Home / Self Care | Payer: HMO | Source: Ambulatory Visit | Attending: Radiation Oncology | Admitting: Radiation Oncology

## 2022-03-31 DIAGNOSIS — C61 Malignant neoplasm of prostate: Secondary | ICD-10-CM | POA: Diagnosis not present

## 2022-03-31 DIAGNOSIS — C772 Secondary and unspecified malignant neoplasm of intra-abdominal lymph nodes: Secondary | ICD-10-CM

## 2022-03-31 NOTE — Progress Notes (Signed)
  Radiation Oncology         [phone] ________________________________  Name: Logan Wolfe MRN: [id number]  Date: 03/31/2022  DOB: [birth year]  ULTRAHYPOFRACTIONATED RADIOTHERAPY  SIMULATION AND TREATMENT PLANNING NOTE    ICD-10-CM   1. 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   NARRATIVE:  The patient was brought to the [facility].  Identity was confirmed.  All relevant records and images related to the planned course of therapy were reviewed.  The patient freely provided informed written consent to proceed with treatment after reviewing the details related to the planned course of therapy. The consent form was witnessed and verified by the simulation staff.  Then, the patient was set-up in a stable reproducible  supine position for radiation therapy.  A BodyFix immobilization pillow was fabricated for reproducible positioning.  Surface markings were placed.  The CT images were loaded into the planning software.  The gross target volumes (GTV) and planning target volumes (PTV) were delinieated, and avoidance structures were contoured.  Treatment planning then occurred.  The radiation prescription was entered and confirmed.  A total of two complex treatment devices were fabricated in the form of the BodyFix immobilization pillow and a neck accuform cushion.  I have requested : 3D Simulation  I have requested a DVH of the following structures: targets and all normal structures near the target including left kidney, right kidney, duodenum, spinal cord/nerve roots and skin as noted on the radiation plan to maintain doses in adherence with established limits  SPECIAL TREATMENT  PROCEDURE:  The planned course of therapy using radiation constitutes a special treatment procedure. Special care is required in the management of this patient for the following reasons. High dose per fraction requiring special monitoring for increased toxicities of treatment including daily imaging..  The special nature of the planned course of radiotherapy will require increased physician supervision and oversight to ensure patient's safety with optimal treatment outcomes.    This requires extended time and effort.    PLAN:  The patient will receive 50 Gy in 10 fractions to the involved left periaortic nodes with 30 Gy to the associated nodal echelon..  ________________________________  Sheral Apley Tammi Klippel, M.D.

## 2022-04-10 DIAGNOSIS — C61 Malignant neoplasm of prostate: Secondary | ICD-10-CM | POA: Diagnosis not present

## 2022-04-10 DIAGNOSIS — C772 Secondary and unspecified malignant neoplasm of intra-abdominal lymph nodes: Secondary | ICD-10-CM | POA: Diagnosis not present

## 2022-04-11 DIAGNOSIS — E7800 Pure hypercholesterolemia, unspecified: Secondary | ICD-10-CM | POA: Diagnosis not present

## 2022-04-11 DIAGNOSIS — G4700 Insomnia, unspecified: Secondary | ICD-10-CM | POA: Diagnosis not present

## 2022-04-13 ENCOUNTER — Other Ambulatory Visit: Payer: Self-pay

## 2022-04-13 ENCOUNTER — Ambulatory Visit
Admission: RE | Admit: 2022-04-13 | Discharge: 2022-04-13 | Disposition: A | Discharge status: Home / Self Care | Payer: HMO | Source: Ambulatory Visit | Attending: Radiation Oncology | Admitting: Radiation Oncology

## 2022-04-13 DIAGNOSIS — C61 Malignant neoplasm of prostate: Secondary | ICD-10-CM

## 2022-04-13 DIAGNOSIS — Z510 Encounter for antineoplastic radiation therapy: Secondary | ICD-10-CM | POA: Diagnosis not present

## 2022-04-13 DIAGNOSIS — C772 Secondary and unspecified malignant neoplasm of intra-abdominal lymph nodes: Secondary | ICD-10-CM

## 2022-04-13 LAB — RAD ONC ARIA SESSION SUMMARY
Course Elapsed Days: 0
Plan Fractions Treated to Date: 1
Plan Prescribed Dose Per Fraction: 5 Gy
Plan Total Fractions Prescribed: 10
Plan Total Prescribed Dose: 50 Gy
Reference Point Dosage Given to Date: 5 Gy
Reference Point Session Dosage Given: 5 Gy
Session Number: 1

## 2022-04-14 ENCOUNTER — Ambulatory Visit
Admission: RE | Admit: 2022-04-14 | Discharge: 2022-04-14 | Disposition: A | Discharge status: Home / Self Care | Payer: HMO | Source: Ambulatory Visit | Attending: Radiation Oncology | Admitting: Radiation Oncology

## 2022-04-14 ENCOUNTER — Other Ambulatory Visit: Payer: Self-pay

## 2022-04-14 ENCOUNTER — Telehealth: Payer: Self-pay

## 2022-04-14 DIAGNOSIS — C61 Malignant neoplasm of prostate: Secondary | ICD-10-CM | POA: Diagnosis not present

## 2022-04-14 DIAGNOSIS — C772 Secondary and unspecified malignant neoplasm of intra-abdominal lymph nodes: Secondary | ICD-10-CM | POA: Insufficient documentation

## 2022-04-14 DIAGNOSIS — Z510 Encounter for antineoplastic radiation therapy: Secondary | ICD-10-CM | POA: Diagnosis not present

## 2022-04-14 LAB — RAD ONC ARIA SESSION SUMMARY
Course Elapsed Days: 1
Plan Fractions Treated to Date: 2
Plan Prescribed Dose Per Fraction: 5 Gy
Plan Total Fractions Prescribed: 10
Plan Total Prescribed Dose: 50 Gy
Reference Point Dosage Given to Date: 10 Gy
Reference Point Session Dosage Given: 5 Gy
Session Number: 2

## 2022-04-14 NOTE — Telephone Encounter (Signed)
RN called patient to follow up concerns he stated that after leaving the clinic today he met with some friends and began to feel sick on his stomach.  Had only 2 radiation treatment to abdomen.  Took sip of Coke and vomit one time.  Denies fever, abdominal pain, or diarrhea.  Reports not having any problems at this time just wanted someone to know what happened.  RN advised patient to eat something light like a chicken broth with dry crackers.  Advised if should worsen or have fever, or abdominal pain to go to the emergency department as soon as possible.  Patient was happy to get a call back and verbalized if symptoms should return or worsen to go to the emergency department.

## 2022-04-17 ENCOUNTER — Ambulatory Visit
Admission: RE | Admit: 2022-04-17 | Discharge: 2022-04-17 | Disposition: A | Discharge status: Home / Self Care | Payer: HMO | Source: Ambulatory Visit | Attending: Radiation Oncology | Admitting: Radiation Oncology

## 2022-04-17 ENCOUNTER — Other Ambulatory Visit: Payer: Self-pay

## 2022-04-17 DIAGNOSIS — C772 Secondary and unspecified malignant neoplasm of intra-abdominal lymph nodes: Secondary | ICD-10-CM | POA: Diagnosis not present

## 2022-04-17 DIAGNOSIS — C61 Malignant neoplasm of prostate: Secondary | ICD-10-CM | POA: Diagnosis not present

## 2022-04-17 DIAGNOSIS — Z510 Encounter for antineoplastic radiation therapy: Secondary | ICD-10-CM | POA: Diagnosis not present

## 2022-04-17 LAB — RAD ONC ARIA SESSION SUMMARY
Course Elapsed Days: 4
Plan Fractions Treated to Date: 3
Plan Prescribed Dose Per Fraction: 5 Gy
Plan Total Fractions Prescribed: 10
Plan Total Prescribed Dose: 50 Gy
Reference Point Dosage Given to Date: 15 Gy
Reference Point Session Dosage Given: 5 Gy
Session Number: 3

## 2022-04-18 ENCOUNTER — Ambulatory Visit
Admission: RE | Admit: 2022-04-18 | Discharge: 2022-04-18 | Disposition: A | Discharge status: Home / Self Care | Payer: HMO | Source: Ambulatory Visit | Attending: Radiation Oncology | Admitting: Radiation Oncology

## 2022-04-18 ENCOUNTER — Other Ambulatory Visit: Payer: Self-pay

## 2022-04-18 DIAGNOSIS — Z510 Encounter for antineoplastic radiation therapy: Secondary | ICD-10-CM | POA: Diagnosis not present

## 2022-04-18 DIAGNOSIS — C61 Malignant neoplasm of prostate: Secondary | ICD-10-CM | POA: Diagnosis not present

## 2022-04-18 DIAGNOSIS — C772 Secondary and unspecified malignant neoplasm of intra-abdominal lymph nodes: Secondary | ICD-10-CM | POA: Diagnosis not present

## 2022-04-18 LAB — RAD ONC ARIA SESSION SUMMARY
Course Elapsed Days: 5
Plan Fractions Treated to Date: 4
Plan Prescribed Dose Per Fraction: 5 Gy
Plan Total Fractions Prescribed: 10
Plan Total Prescribed Dose: 50 Gy
Reference Point Dosage Given to Date: 20 Gy
Reference Point Session Dosage Given: 5 Gy
Session Number: 4

## 2022-04-19 ENCOUNTER — Other Ambulatory Visit: Payer: Self-pay

## 2022-04-19 ENCOUNTER — Telehealth: Payer: Self-pay

## 2022-04-19 ENCOUNTER — Other Ambulatory Visit: Payer: Self-pay | Admitting: Radiology

## 2022-04-19 ENCOUNTER — Ambulatory Visit
Admission: RE | Admit: 2022-04-19 | Discharge: 2022-04-19 | Disposition: A | Discharge status: Home / Self Care | Payer: HMO | Source: Ambulatory Visit | Attending: Radiation Oncology | Admitting: Radiation Oncology

## 2022-04-19 ENCOUNTER — Other Ambulatory Visit: Payer: Self-pay | Admitting: Radiation Oncology

## 2022-04-19 DIAGNOSIS — Z510 Encounter for antineoplastic radiation therapy: Secondary | ICD-10-CM | POA: Diagnosis not present

## 2022-04-19 DIAGNOSIS — C61 Malignant neoplasm of prostate: Secondary | ICD-10-CM | POA: Diagnosis not present

## 2022-04-19 DIAGNOSIS — C772 Secondary and unspecified malignant neoplasm of intra-abdominal lymph nodes: Secondary | ICD-10-CM | POA: Diagnosis not present

## 2022-04-19 LAB — RAD ONC ARIA SESSION SUMMARY
Course Elapsed Days: 6
Plan Fractions Treated to Date: 5
Plan Prescribed Dose Per Fraction: 5 Gy
Plan Total Fractions Prescribed: 10
Plan Total Prescribed Dose: 50 Gy
Reference Point Dosage Given to Date: 25 Gy
Reference Point Session Dosage Given: 5 Gy
Session Number: 5

## 2022-04-19 MED ORDER — ONDANSETRON HCL 8 MG PO TABS: 8.0000 mg | ORAL_TABLET | Freq: Three times a day (TID) | ORAL | 0 refills | Status: DC | PRN

## 2022-04-19 NOTE — Telephone Encounter (Signed)
RN called Logan Wolfe per Logan Gentry, PA-C to inform him of prescription for nausea that was sent in to his pharmacy.  Logan Wolfe wasn't available to answer call left message to voicemail.

## 2022-04-19 NOTE — Progress Notes (Signed)
I was called and informed of patient having nausea from radiation treatment today. I sent a prescription to his pharmacy for Zofran to take as needed for nausea. Patient's nurse called him to inform him of this.

## 2022-04-20 ENCOUNTER — Ambulatory Visit
Admission: RE | Admit: 2022-04-20 | Discharge: 2022-04-20 | Disposition: A | Discharge status: Home / Self Care | Payer: HMO | Source: Ambulatory Visit | Attending: Radiation Oncology | Admitting: Radiation Oncology

## 2022-04-20 ENCOUNTER — Other Ambulatory Visit: Payer: Self-pay

## 2022-04-20 ENCOUNTER — Other Ambulatory Visit: Payer: Self-pay | Admitting: Radiation Oncology

## 2022-04-20 DIAGNOSIS — C772 Secondary and unspecified malignant neoplasm of intra-abdominal lymph nodes: Secondary | ICD-10-CM | POA: Diagnosis not present

## 2022-04-20 DIAGNOSIS — C61 Malignant neoplasm of prostate: Secondary | ICD-10-CM | POA: Diagnosis not present

## 2022-04-20 DIAGNOSIS — Z510 Encounter for antineoplastic radiation therapy: Secondary | ICD-10-CM | POA: Diagnosis not present

## 2022-04-20 LAB — RAD ONC ARIA SESSION SUMMARY
Course Elapsed Days: 7
Plan Fractions Treated to Date: 6
Plan Prescribed Dose Per Fraction: 5 Gy
Plan Total Fractions Prescribed: 10
Plan Total Prescribed Dose: 50 Gy
Reference Point Dosage Given to Date: 30 Gy
Reference Point Session Dosage Given: 5 Gy
Session Number: 6

## 2022-04-20 MED ORDER — MEGESTROL ACETATE 20 MG PO TABS: 20.0000 mg | ORAL_TABLET | Freq: Every day | ORAL | 5 refills | Status: DC

## 2022-04-21 ENCOUNTER — Other Ambulatory Visit: Payer: Self-pay

## 2022-04-21 ENCOUNTER — Ambulatory Visit
Admission: RE | Admit: 2022-04-21 | Discharge: 2022-04-21 | Disposition: A | Discharge status: Home / Self Care | Payer: HMO | Source: Ambulatory Visit | Attending: Radiation Oncology | Admitting: Radiation Oncology

## 2022-04-21 DIAGNOSIS — C772 Secondary and unspecified malignant neoplasm of intra-abdominal lymph nodes: Secondary | ICD-10-CM | POA: Diagnosis not present

## 2022-04-21 DIAGNOSIS — Z510 Encounter for antineoplastic radiation therapy: Secondary | ICD-10-CM | POA: Diagnosis not present

## 2022-04-21 DIAGNOSIS — C61 Malignant neoplasm of prostate: Secondary | ICD-10-CM | POA: Diagnosis not present

## 2022-04-21 LAB — RAD ONC ARIA SESSION SUMMARY
Course Elapsed Days: 8
Plan Fractions Treated to Date: 7
Plan Prescribed Dose Per Fraction: 5 Gy
Plan Total Fractions Prescribed: 10
Plan Total Prescribed Dose: 50 Gy
Reference Point Dosage Given to Date: 35 Gy
Reference Point Session Dosage Given: 5 Gy
Session Number: 7

## 2022-04-24 ENCOUNTER — Ambulatory Visit
Admission: RE | Admit: 2022-04-24 | Discharge: 2022-04-24 | Disposition: A | Discharge status: Home / Self Care | Payer: HMO | Source: Ambulatory Visit | Attending: Radiation Oncology | Admitting: Radiation Oncology

## 2022-04-24 ENCOUNTER — Other Ambulatory Visit: Payer: Self-pay

## 2022-04-24 DIAGNOSIS — C61 Malignant neoplasm of prostate: Secondary | ICD-10-CM | POA: Diagnosis not present

## 2022-04-24 DIAGNOSIS — C772 Secondary and unspecified malignant neoplasm of intra-abdominal lymph nodes: Secondary | ICD-10-CM | POA: Diagnosis not present

## 2022-04-24 DIAGNOSIS — Z510 Encounter for antineoplastic radiation therapy: Secondary | ICD-10-CM | POA: Diagnosis not present

## 2022-04-24 LAB — RAD ONC ARIA SESSION SUMMARY
Course Elapsed Days: 11
Plan Fractions Treated to Date: 8
Plan Prescribed Dose Per Fraction: 5 Gy
Plan Total Fractions Prescribed: 10
Plan Total Prescribed Dose: 50 Gy
Reference Point Dosage Given to Date: 40 Gy
Reference Point Session Dosage Given: 5 Gy
Session Number: 8

## 2022-04-25 ENCOUNTER — Other Ambulatory Visit: Payer: Self-pay

## 2022-04-25 ENCOUNTER — Ambulatory Visit
Admission: RE | Admit: 2022-04-25 | Discharge: 2022-04-25 | Disposition: A | Discharge status: Home / Self Care | Payer: HMO | Source: Ambulatory Visit | Attending: Radiation Oncology | Admitting: Radiation Oncology

## 2022-04-25 DIAGNOSIS — Z510 Encounter for antineoplastic radiation therapy: Secondary | ICD-10-CM | POA: Diagnosis not present

## 2022-04-25 DIAGNOSIS — C772 Secondary and unspecified malignant neoplasm of intra-abdominal lymph nodes: Secondary | ICD-10-CM | POA: Diagnosis not present

## 2022-04-25 DIAGNOSIS — C61 Malignant neoplasm of prostate: Secondary | ICD-10-CM | POA: Diagnosis not present

## 2022-04-25 LAB — RAD ONC ARIA SESSION SUMMARY
Course Elapsed Days: 12
Plan Fractions Treated to Date: 9
Plan Prescribed Dose Per Fraction: 5 Gy
Plan Total Fractions Prescribed: 10
Plan Total Prescribed Dose: 50 Gy
Reference Point Dosage Given to Date: 45 Gy
Reference Point Session Dosage Given: 5 Gy
Session Number: 9

## 2022-04-26 ENCOUNTER — Other Ambulatory Visit: Payer: Self-pay

## 2022-04-26 ENCOUNTER — Ambulatory Visit
Admission: RE | Admit: 2022-04-26 | Discharge: 2022-04-26 | Disposition: A | Discharge status: Home / Self Care | Payer: HMO | Source: Ambulatory Visit | Attending: Radiation Oncology | Admitting: Radiation Oncology

## 2022-04-26 ENCOUNTER — Encounter: Payer: Self-pay | Admitting: Urology

## 2022-04-26 DIAGNOSIS — C772 Secondary and unspecified malignant neoplasm of intra-abdominal lymph nodes: Secondary | ICD-10-CM | POA: Diagnosis not present

## 2022-04-26 DIAGNOSIS — Z510 Encounter for antineoplastic radiation therapy: Secondary | ICD-10-CM | POA: Diagnosis not present

## 2022-04-26 DIAGNOSIS — C61 Malignant neoplasm of prostate: Secondary | ICD-10-CM | POA: Diagnosis not present

## 2022-04-26 DIAGNOSIS — C775 Secondary and unspecified malignant neoplasm of intrapelvic lymph nodes: Secondary | ICD-10-CM | POA: Diagnosis not present

## 2022-04-26 LAB — RAD ONC ARIA SESSION SUMMARY
Course Elapsed Days: 13
Plan Fractions Treated to Date: 10
Plan Prescribed Dose Per Fraction: 5 Gy
Plan Total Fractions Prescribed: 10
Plan Total Prescribed Dose: 50 Gy
Reference Point Dosage Given to Date: 50 Gy
Reference Point Session Dosage Given: 5 Gy
Session Number: 10

## 2022-05-01 DIAGNOSIS — M259 Joint disorder, unspecified: Secondary | ICD-10-CM | POA: Diagnosis not present

## 2022-05-01 DIAGNOSIS — M5451 Vertebrogenic low back pain: Secondary | ICD-10-CM | POA: Diagnosis not present

## 2022-05-01 DIAGNOSIS — M25551 Pain in right hip: Secondary | ICD-10-CM | POA: Diagnosis not present

## 2022-05-10 DIAGNOSIS — M533 Sacrococcygeal disorders, not elsewhere classified: Secondary | ICD-10-CM | POA: Diagnosis not present

## 2022-05-15 DIAGNOSIS — M25551 Pain in right hip: Secondary | ICD-10-CM | POA: Diagnosis not present

## 2022-05-24 DIAGNOSIS — M25551 Pain in right hip: Secondary | ICD-10-CM | POA: Diagnosis not present

## 2022-05-24 DIAGNOSIS — C61 Malignant neoplasm of prostate: Secondary | ICD-10-CM | POA: Diagnosis not present

## 2022-05-26 DIAGNOSIS — M25551 Pain in right hip: Secondary | ICD-10-CM | POA: Diagnosis not present

## 2022-05-29 DIAGNOSIS — M25551 Pain in right hip: Secondary | ICD-10-CM | POA: Diagnosis not present

## 2022-06-05 DIAGNOSIS — M25551 Pain in right hip: Secondary | ICD-10-CM | POA: Diagnosis not present

## 2022-06-08 DIAGNOSIS — M25551 Pain in right hip: Secondary | ICD-10-CM | POA: Diagnosis not present

## 2022-06-12 DIAGNOSIS — M25551 Pain in right hip: Secondary | ICD-10-CM | POA: Diagnosis not present

## 2022-06-21 DIAGNOSIS — E7800 Pure hypercholesterolemia, unspecified: Secondary | ICD-10-CM | POA: Diagnosis not present

## 2022-06-21 DIAGNOSIS — G4700 Insomnia, unspecified: Secondary | ICD-10-CM | POA: Diagnosis not present

## 2022-06-21 DIAGNOSIS — C61 Malignant neoplasm of prostate: Secondary | ICD-10-CM | POA: Diagnosis not present

## 2022-06-29 DIAGNOSIS — Z0000 Encounter for general adult medical examination without abnormal findings: Secondary | ICD-10-CM | POA: Diagnosis not present

## 2022-06-29 DIAGNOSIS — E669 Obesity, unspecified: Secondary | ICD-10-CM | POA: Diagnosis not present

## 2022-07-03 DIAGNOSIS — Z79899 Other long term (current) drug therapy: Secondary | ICD-10-CM | POA: Diagnosis not present

## 2022-07-03 DIAGNOSIS — L609 Nail disorder, unspecified: Secondary | ICD-10-CM | POA: Diagnosis not present

## 2022-07-03 DIAGNOSIS — C775 Secondary and unspecified malignant neoplasm of intrapelvic lymph nodes: Secondary | ICD-10-CM | POA: Diagnosis not present

## 2022-07-03 DIAGNOSIS — M461 Sacroiliitis, not elsewhere classified: Secondary | ICD-10-CM | POA: Diagnosis not present

## 2022-07-03 DIAGNOSIS — R232 Flushing: Secondary | ICD-10-CM | POA: Diagnosis not present

## 2022-07-03 DIAGNOSIS — E7800 Pure hypercholesterolemia, unspecified: Secondary | ICD-10-CM | POA: Diagnosis not present

## 2022-07-03 DIAGNOSIS — Z0000 Encounter for general adult medical examination without abnormal findings: Secondary | ICD-10-CM | POA: Diagnosis not present

## 2022-07-03 DIAGNOSIS — Z6830 Body mass index (BMI) 30.0-30.9, adult: Secondary | ICD-10-CM | POA: Diagnosis not present

## 2022-07-03 DIAGNOSIS — G479 Sleep disorder, unspecified: Secondary | ICD-10-CM | POA: Diagnosis not present

## 2022-07-31 ENCOUNTER — Ambulatory Visit (INDEPENDENT_AMBULATORY_CARE_PROVIDER_SITE_OTHER): Payer: HMO | Admitting: Podiatry

## 2022-07-31 ENCOUNTER — Encounter: Payer: Self-pay | Admitting: Podiatry

## 2022-07-31 DIAGNOSIS — M79675 Pain in left toe(s): Secondary | ICD-10-CM

## 2022-07-31 DIAGNOSIS — M79674 Pain in right toe(s): Secondary | ICD-10-CM

## 2022-07-31 DIAGNOSIS — B351 Tinea unguium: Secondary | ICD-10-CM

## 2022-07-31 NOTE — Progress Notes (Signed)
   Chief Complaint  Patient presents with   Nail Problem    "I have some ugly toes." N - toenails L - bilateral D - 5 yrs O - gradually worse C - thick, brittle, discolored A - dress shoes T - no tx    SUBJECTIVE Patient presents to office today complaining of elongated, thickened nails that cause pain while ambulating in shoes to the bilateral great toes.  Patient is unable to trim their own nails.  Patient has had a prior nail avulsion to the right hallux and she states that it actually grew back worse than what it was prior.  Patient is here for further evaluation and treatment.  Past Medical History:  Diagnosis Date   Allergic rhinitis, cause unspecified    Anxiety    Aortic valve calcification    Cancer (HCC)    Prostate CA, Dr Annabell Howells 22 years ago   Constipation    Depressive disorder    Dyspepsia and other specified disorders of function of stomach    GERD (gastroesophageal reflux disease)    Hypoglycemia    Memory loss    Otitis    externa of right ear   Prostate cancer (HCC)    Spinal stenosis    Unspecified pruritic disorder     Allergies  Allergen Reactions   Doxycycline     Other reaction(s): burning on tongue   Neosporin [Bacitracin-Polymyxin B] Other (See Comments)    Redness   Penicillins Rash    Has patient had a PCN reaction causing immediate rash, facial/tongue/throat swelling, SOB or lightheadedness with hypotension: No Has patient had a PCN reaction causing severe rash involving mucus membranes or skin necrosis: No Has patient had a PCN reaction that required hospitalization: No Has patient had a PCN reaction occurring within the last 10 years: No If all of the above answers are "NO", then may proceed with Cephalosporin use.     OBJECTIVE General Patient is awake, alert, and oriented x 3 and in no acute distress. Derm Skin is dry and supple bilateral. Negative open lesions or macerations. Remaining integument unremarkable. Nails are tender,  long, thickened and dystrophic with subungual debris, consistent with onychomycosis to the bilateral great toes.  Lesser digit toenails WNL. No signs of infection noted. Vasc  DP and PT pedal pulses palpable bilaterally. Temperature gradient within normal limits.  Neuro grossly intact via light touch Musculoskeletal Exam No symptomatic pedal deformities noted bilateral. Muscular strength within normal limits.  ASSESSMENT 1.  Pain due to onychomycosis of toenails bilateral great toes  PLAN OF CARE -Patient evaluated today.  Discussed the possibility of total permanent nail avulsions to the bilateral great toes but for now the patient would like to pursue conservative treatment for now -Instructed to maintain good pedal hygiene and foot care.  -Mechanical debridement of nails 1-5 bilaterally performed using a nail nipper. Filed with dremel without incident.  -Return to clinic in 3 mos.    Felecia Shelling, DPM [facility]  Dr. Felecia Shelling, DPM    [address] [place], [place] [address]                Office [phone]  Fax [phone]

## 2022-08-15 DIAGNOSIS — R232 Flushing: Secondary | ICD-10-CM | POA: Diagnosis not present

## 2022-08-15 DIAGNOSIS — G4700 Insomnia, unspecified: Secondary | ICD-10-CM | POA: Diagnosis not present

## 2022-08-15 DIAGNOSIS — Z6830 Body mass index (BMI) 30.0-30.9, adult: Secondary | ICD-10-CM | POA: Diagnosis not present

## 2022-08-15 DIAGNOSIS — K625 Hemorrhage of anus and rectum: Secondary | ICD-10-CM | POA: Diagnosis not present

## 2022-08-23 DIAGNOSIS — C61 Malignant neoplasm of prostate: Secondary | ICD-10-CM | POA: Diagnosis not present

## 2022-08-23 DIAGNOSIS — C775 Secondary and unspecified malignant neoplasm of intrapelvic lymph nodes: Secondary | ICD-10-CM | POA: Diagnosis not present

## 2022-08-30 DIAGNOSIS — C61 Malignant neoplasm of prostate: Secondary | ICD-10-CM | POA: Diagnosis not present

## 2022-09-20 DIAGNOSIS — R197 Diarrhea, unspecified: Secondary | ICD-10-CM | POA: Diagnosis not present

## 2022-10-11 DIAGNOSIS — H524 Presbyopia: Secondary | ICD-10-CM | POA: Diagnosis not present

## 2022-10-11 DIAGNOSIS — H40013 Open angle with borderline findings, low risk, bilateral: Secondary | ICD-10-CM | POA: Diagnosis not present

## 2022-10-11 DIAGNOSIS — H5203 Hypermetropia, bilateral: Secondary | ICD-10-CM | POA: Diagnosis not present

## 2022-10-11 DIAGNOSIS — H35031 Hypertensive retinopathy, right eye: Secondary | ICD-10-CM | POA: Diagnosis not present

## 2022-10-11 DIAGNOSIS — H0102A Squamous blepharitis right eye, upper and lower eyelids: Secondary | ICD-10-CM | POA: Diagnosis not present

## 2022-10-11 DIAGNOSIS — H25813 Combined forms of age-related cataract, bilateral: Secondary | ICD-10-CM | POA: Diagnosis not present

## 2022-10-11 DIAGNOSIS — H52223 Regular astigmatism, bilateral: Secondary | ICD-10-CM | POA: Diagnosis not present

## 2022-10-11 NOTE — Progress Notes (Deleted)
Cardiology Office Note   Date:  10/11/2022   ID:  Logan Wolfe [birth year], MRN [id number]  PCP:  Daisy Floro, MD    No chief complaint on file.    Wt Readings from Last 3 Encounters:  03/17/22 223 lb 3.2 oz (101.2 kg)  03/16/22 228 lb 4 oz (103.5 kg)  08/29/21 224 lb (101.6 kg)       History of Present Illness: Logan Wolfe is a 77 y.o. male  ***   who had transient confusion for 15 minutes in 2014.  He could not read certain words.  He had trouble spelling certain words as well.  He saw Dr. Tenny Craw who sent him for MRI of his brain. This did not show evidence of recent embolic phenomenon at the time.  He has had a prior cerebellar infarct.    2014 echo showed: Left ventricle: The cavity size was normal. There was mild   focal basal hypertrophy of the septum. Systolic function   was normal. The estimated ejection fraction was in the   range of 60% to 65%. Wall motion was normal; there were no   regional wall motion abnormalities. - Aortic valve: There was mild stenosis by velocities,   although leafelt excursion appearedmoderately restricted.   Trivial regurgitation. - Mitral valve: Calcified annulus. Mildly calcified leaflets   . Mild regurgitation.   Jan 2019: Normal LVEF. Aortic stenosis is still only mild.    2024 echo showed: "Left ventricular ejection fraction, by estimation, is 55 to 60%. The  left ventricle has normal function. The left ventricle has no regional  wall motion abnormalities. There is moderate left ventricular hypertrophy.  Left ventricular diastolic  parameters are consistent with Grade II diastolic dysfunction  (pseudonormalization).   2. Right ventricular systolic function is normal. The right ventricular  size is normal.   3. Left atrial size was mildly dilated.   4. The mitral valve is degenerative. Mild mitral valve regurgitation. No  evidence of mitral stenosis. The mean mitral valve gradient is 2.0 mmHg.  Moderate  mitral annular calcification.   5. The aortic valve is calcified. There is severe calcifcation of the  aortic valve. There is severe thickening of the aortic valve. Aortic valve  regurgitation is mild. Moderate aortic valve stenosis. Aortic  regurgitation PHT measures 758 msec. Aortic  valve area, by VTI measures 1.12 cm. Aortic valve mean gradient measures  22.0 mmHg. Aortic valve Vmax measures 2.78 m/s.   6. The inferior vena cava is dilated in size with <50% respiratory  variability, suggesting right atrial pressure of 15 mmHg. "   Hospitalized in Jan for SVT: "SVT (supraventricular tachycardia) Moderate aortic stenosis, mild mitral regurgitation -Status post chemical cardioversion with adenosine-started on metoprolol -TSH 1.5-6 - 2D echo shows chronic mild MR and moderate AS which I have discussed with the patient - Toprol 25 mg daily being started and needs to be continued-the patient is allowed to take an extra half tab if he feels palpitations -I have explained to him the side effects of Toprol and have also asked him to discuss it when he picks it up from the pharmacy with the pharmacist.   Active Problems:   Elevated troponin - Troponin 19, 273 and 270 -demand ischemia secondary to above     AKI (acute kidney injury) (HCC) -Creatinine was 1.34 - Has improved to 1.06     Metabolic acidosis -Bicarb was 19 and has improved to 22  Hyponatremia -Sodium was only mildly low at 134 and has improved to 137"  Being treated for PrCA. Past Medical History:  Diagnosis Date   Allergic rhinitis, cause unspecified    Anxiety    Aortic valve calcification    Cancer (HCC)    Prostate CA, Dr Annabell Howells 22 years ago   Constipation    Depressive disorder    Dyspepsia and other specified disorders of function of stomach    GERD (gastroesophageal reflux disease)    Hypoglycemia    Memory loss    Otitis    externa of right ear   Prostate cancer (HCC)    Spinal stenosis     Unspecified pruritic disorder     Past Surgical History:  Procedure Laterality Date   PROSTATE SURGERY       Current Outpatient Medications  Medication Sig Dispense Refill   apalutamide (ERLEADA) 60 MG tablet Take 240 mg by mouth daily.     fluticasone (FLONASE) 50 MCG/ACT nasal spray 1 spray in each nostril (Patient not taking: Reported on 07/31/2022)     megestrol (MEGACE) 20 MG tablet Take 1-3 tablets (20-60 mg total) by mouth daily. 45 tablet 5   Melatonin 10 MG SUBL 1 tablet at bedtime as needed with food     meloxicam (MOBIC) 15 MG tablet Take 15 mg by mouth daily.     metoprolol succinate (TOPROL-XL) 50 MG 24 hr tablet Take 1 tablet (50 mg total) by mouth daily. Take with or immediately following a meal. 90 tablet 3   ondansetron (ZOFRAN) 8 MG tablet Take 1 tablet (8 mg total) by mouth every 8 (eight) hours as needed for nausea or vomiting. (Patient not taking: Reported on 07/31/2022) 20 tablet 0   oxyBUTYnin (DITROPAN) 5 MG/5ML solution Take 5 mg by mouth 2 (two) times daily.     relugolix (ORGOVYX) 120 MG tablet Take 120 mg by mouth daily.     No current facility-administered medications for this visit.    Allergies:   Doxycycline, Neosporin [bacitracin-polymyxin b], and Penicillins    Social History:  The patient  reports that he has been smoking cigars. He has never used smokeless tobacco. He reports current alcohol use of about 1.0 standard drink of alcohol per week. He reports that he does not use drugs.   Family History:  The patient's ***family history includes Diabetes in his mother; Hyperlipidemia in his mother; Prostate cancer in his brother; Skin cancer in his father.    ROS:  Please see the history of present illness.   Otherwise, review of systems are positive for ***.   All other systems are reviewed and negative.    PHYSICAL EXAM: VS:  There were no vitals taken for this visit. , BMI There is no height or weight on file to calculate BMI. GEN: Well nourished,  well developed, in no acute distress HEENT: normal Neck: no JVD, carotid bruits, or masses Cardiac: ***RRR; no murmurs, rubs, or gallops,no edema  Respiratory:  clear to auscultation bilaterally, normal work of breathing GI: soft, nontender, nondistended, + BS MS: no deformity or atrophy Skin: warm and dry, no rash Neuro:  Strength and sensation are intact Psych: euthymic mood, full affect   EKG:   The ekg ordered today demonstrates ***   Recent Labs: 03/04/2022: B Natriuretic Peptide 169.6; Hemoglobin 13.0; Magnesium 2.1; Platelets 265 03/05/2022: BUN 14; Creatinine, Ser 1.06; Potassium 3.9; Sodium 137; TSH 1.526   Lipid Panel    Component Value Date/Time   CHOL  210 (H) 12/27/2012 1109   TRIG 142 12/27/2012 1109   HDL 56 12/27/2012 1109   CHOLHDL 3.8 12/27/2012 1109   LDLCALC 126 (H) 12/27/2012 1109     Other studies Reviewed: Additional studies/ records that were reviewed today with results demonstrating: ***.   ASSESSMENT AND PLAN:  Aortic stenosis: Moderate by January 2024 echo. Carotid artery disease: Mild in 2014. Hyperlipidemia: SVT: Now on Toprol-XL.  If you have recurrent SVT, would refer to EP to consider ablation.   Current medicines are reviewed at length with the patient today.  The patient concerns regarding his medicines were addressed.  The following changes have been made:  No change***  Labs/ tests ordered today include: *** No orders of the defined types were placed in this encounter.   Recommend 150 minutes/week of aerobic exercise Low fat, low carb, high fiber diet recommended  Disposition:   FU in ***   Signed, Lance Muss, MD  10/11/2022 12:51 PM    [facility] [address] [place], [place], [place]  [address] Phone: [phone]; Fax: [phone]

## 2022-10-12 ENCOUNTER — Ambulatory Visit: Payer: HMO | Admitting: Interventional Cardiology

## 2022-10-12 DIAGNOSIS — I359 Nonrheumatic aortic valve disorder, unspecified: Secondary | ICD-10-CM

## 2022-10-12 DIAGNOSIS — E782 Mixed hyperlipidemia: Secondary | ICD-10-CM

## 2022-10-12 DIAGNOSIS — I471 Supraventricular tachycardia, unspecified: Secondary | ICD-10-CM

## 2022-10-12 DIAGNOSIS — I6523 Occlusion and stenosis of bilateral carotid arteries: Secondary | ICD-10-CM

## 2022-10-26 NOTE — Progress Notes (Signed)
  Radiation Oncology         [phone]) [phone] ________________________________  Name: Logan Wolfe MRN: [id number]  Date: 04/26/2022  DOB: [birth year]  End of Treatment Note  Diagnosis:   77 y.o. 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      Indication for treatment:  Curative, Definitive UHRT       Radiation treatment dates:   04/13/22 - 04/26/22  Site/dose:   The involved left periaortic nodes were treated to 50 Gy in 10 fractions of 5 Gy with 30 Gy to the associated nodal echelon.  Beams/energy:   The patient was treated using stereotactic body radiotherapy according to a 3D conformal radiotherapy plan.  Volumetric arc fields were employed to deliver 6 MV X-rays.  Image guidance was performed with per fraction cone beam CT prior to treatment under personal MD supervision.  Immobilization was achieved using BodyFix Pillow.  Narrative: The patient tolerated radiation treatment relatively well with some modest fatigue.  Plan: The patient has completed radiation treatment. The patient will return to radiation oncology clinic for routine followup in one month. I advised them to call or return sooner if they have any questions or concerns related to their recovery or treatment. ________________________________  Artist Pais. Kathrynn Running, M.D.

## 2022-11-01 ENCOUNTER — Ambulatory Visit: Payer: HMO | Admitting: Physician Assistant

## 2022-11-03 NOTE — Radiation Completion Notes (Signed)
Patient Name: LAFE, MATIAS MRN: [id number] Date of Birth: [birth year] Referring Physician: Bjorn Pippin, M.D. Date of Service: 2022-11-03 Radiation Oncologist: Margaretmary Bayley, M.D. [facility]                             RADIATION ONCOLOGY END OF TREATMENT NOTE     Diagnosis: C61 Malignant neoplasm of prostate Intent: Curative     ==========DELIVERED PLANS==========  First Treatment Date: 2022-04-13 - Last Treatment Date: 2022-04-26   Plan Name: Abd_UHRT Site: Abdomen Technique: IMRT Mode: Photon Dose Per Fraction: 5 Gy Prescribed Dose (Delivered / Prescribed): 50 Gy / 50 Gy Prescribed Fxs (Delivered / Prescribed): 10 / 10     ==========ON TREATMENT VISIT DATES========== 2022-04-14, 2022-04-20     ==========UPCOMING VISITS==========       ==========APPENDIX - ON TREATMENT VISIT NOTES==========   See weekly On Treatment Notes in Epic for details.

## 2022-11-06 ENCOUNTER — Ambulatory Visit: Payer: HMO | Attending: Interventional Cardiology | Admitting: Interventional Cardiology

## 2022-11-06 ENCOUNTER — Encounter: Payer: Self-pay | Admitting: Interventional Cardiology

## 2022-11-06 VITALS — BP 102/54 | HR 57 | Ht 73.0 in | Wt 227.0 lb

## 2022-11-06 DIAGNOSIS — E782 Mixed hyperlipidemia: Secondary | ICD-10-CM

## 2022-11-06 DIAGNOSIS — I471 Supraventricular tachycardia, unspecified: Secondary | ICD-10-CM

## 2022-11-06 DIAGNOSIS — I359 Nonrheumatic aortic valve disorder, unspecified: Secondary | ICD-10-CM | POA: Diagnosis not present

## 2022-11-06 DIAGNOSIS — I6523 Occlusion and stenosis of bilateral carotid arteries: Secondary | ICD-10-CM

## 2022-11-06 MED ORDER — ROSUVASTATIN CALCIUM 10 MG PO TABS: 10.0000 mg | ORAL_TABLET | Freq: Every day | ORAL | 3 refills | Status: DC

## 2022-11-06 NOTE — Patient Instructions (Signed)
Medication Instructions:  Your physician has recommended you make the following change in your medication:  Start Rosuvastatin 10 mg by mouth daily   *If you need a refill on your cardiac medications before your next appointment, please call your pharmacy*   Lab Work: Your physician recommends that you return for lab work in: about 3 months.  Lipid and liver profiles.  This will be fasting  If you have labs (blood work) drawn today and your tests are completely normal, you will receive your results only by: MyChart Message (if you have MyChart) OR A paper copy in the mail If you have any lab test that is abnormal or we need to change your treatment, we will call you to review the results.   Testing/Procedures: none   Follow-Up: At [facility], you and your health needs are our priority.  As part of our continuing mission to provide you with exceptional heart care, we have created designated Provider Care Teams.  These Care Teams include your primary Cardiologist (physician) and Advanced Practice Providers (APPs -  Physician Assistants and Nurse Practitioners) who all work together to provide you with the care you need, when you need it.  We recommend signing up for the patient portal called "MyChart".  Sign up information is provided on this After Visit Summary.  MyChart is used to connect with patients for Virtual Visits (Telemedicine).  Patients are able to view lab/test results, encounter notes, upcoming appointments, etc.  Non-urgent messages can be sent to your provider as well.   To learn more about what you can do with MyChart, go to [email or web address].    Your next appointment:   6 month(s)  Provider:   Dr Flora Lipps, Dr Bjorn Pippin, Dr Anne Fu, Dr Izora Ribas     Other Instructions

## 2022-11-06 NOTE — Progress Notes (Signed)
Cardiology Office Note   Date:  11/06/2022   ID:  Karey, [place] 10-08-1945, MRN [id number]  PCP:  Daisy Floro, MD    No chief complaint on file.  Aortic stenosis  Wt Readings from Last 3 Encounters:  11/06/22 227 lb (103 kg)  03/17/22 223 lb 3.2 oz (101.2 kg)  03/16/22 228 lb 4 oz (103.5 kg)       History of Present Illness: Logan Wolfe is a 77 y.o. male  who had transient confusion for 15 minutes in 2014.  He could not read certain words.  He had trouble spelling certain words as well.  He saw Dr. Tenny Craw who sent him for MRI of his brain. This did not show evidence of recent embolic phenomenon at the time.  He has had a prior cerebellar infarct.    2014 echo showed: Left ventricle: The cavity size was normal. There was mild   focal basal hypertrophy of the septum. Systolic function   was normal. The estimated ejection fraction was in the   range of 60% to 65%. Wall motion was normal; there were no   regional wall motion abnormalities. - Aortic valve: There was mild stenosis by velocities,   although leafelt excursion appearedmoderately restricted.   Trivial regurgitation. - Mitral valve: Calcified annulus. Mildly calcified leaflets   . Mild regurgitation.   Jan 2019: Normal LVEF. Aortic stenosis is still only mild.    2024 echo showed: "Left ventricular ejection fraction, by estimation, is 55 to 60%. The  left ventricle has normal function. The left ventricle has no regional  wall motion abnormalities. There is moderate left ventricular hypertrophy.  Left ventricular diastolic  parameters are consistent with Grade II diastolic dysfunction  (pseudonormalization).   2. Right ventricular systolic function is normal. The right ventricular  size is normal.   3. Left atrial size was mildly dilated.   4. The mitral valve is degenerative. Mild mitral valve regurgitation. No  evidence of mitral stenosis. The mean mitral valve gradient is 2.0 mmHg.  Moderate  mitral annular calcification.   5. The aortic valve is calcified. There is severe calcifcation of the  aortic valve. There is severe thickening of the aortic valve. Aortic valve  regurgitation is mild. Moderate aortic valve stenosis. Aortic  regurgitation PHT measures 758 msec. Aortic  valve area, by VTI measures 1.12 cm. Aortic valve mean gradient measures  22.0 mmHg. Aortic valve Vmax measures 2.78 m/s.   6. The inferior vena cava is dilated in size with <50% respiratory  variability, suggesting right atrial pressure of 15 mmHg. "   Hospitalized in Jan 2024 for SVT: "SVT (supraventricular tachycardia) Moderate aortic stenosis, mild mitral regurgitation -Status post chemical cardioversion with adenosine-started on metoprolol -TSH 1.5-6 - 2D echo shows chronic mild MR and moderate AS which I have discussed with the patient - Toprol 25 mg daily being started and needs to be continued-the patient is allowed to take an extra half tab if he feels palpitations -I have explained to him the side effects of Toprol and have also asked him to discuss it when he picks it up from the pharmacy with the pharmacist.   Active Problems:   Elevated troponin - Troponin 19, 273 and 270 -demand ischemia secondary to above     AKI (acute kidney injury) (HCC) -Creatinine was 1.34 - Has improved to 1.06     Metabolic acidosis -Bicarb was 19 and has improved to 22     Hyponatremia -  Sodium was only mildly low at 134 and has improved to 137"  SVT has been managed with Toprol-XL 50 mg daily.  Denies : Chest pain. Dizziness. Leg edema. Nitroglycerin use. Orthopnea. Palpitations. Paroxysmal nocturnal dyspnea. Shortness of breath. Syncope.    Some dietary indiscretion.   Past Medical History:  Diagnosis Date   Allergic rhinitis, cause unspecified    Anxiety    Aortic valve calcification    Cancer (HCC)    Prostate CA, Dr Annabell Howells 22 years ago   Constipation    Depressive disorder    Dyspepsia and  other specified disorders of function of stomach    GERD (gastroesophageal reflux disease)    Hypoglycemia    Memory loss    Otitis    externa of right ear   Prostate cancer (HCC)    Spinal stenosis    Unspecified pruritic disorder     Past Surgical History:  Procedure Laterality Date   PROSTATE SURGERY       Current Outpatient Medications  Medication Sig Dispense Refill   apalutamide (ERLEADA) 60 MG tablet Take 240 mg by mouth daily.     budesonide (ENTOCORT EC) 3 MG 24 hr capsule Take by mouth.     fluticasone (FLONASE) 50 MCG/ACT nasal spray      megestrol (MEGACE) 20 MG tablet Take 1-3 tablets (20-60 mg total) by mouth daily. 45 tablet 5   Melatonin 10 MG SUBL 1 tablet at bedtime as needed with food     metoprolol succinate (TOPROL-XL) 50 MG 24 hr tablet Take 1 tablet (50 mg total) by mouth daily. Take with or immediately following a meal. 90 tablet 3   oxybutynin (DITROPAN) 5 MG tablet Take 5 mg by mouth 2 (two) times daily as needed.     polyethylene glycol (MIRALAX / GLYCOLAX) 17 g packet Take by mouth.     relugolix (ORGOVYX) 120 MG tablet Take 120 mg by mouth daily.     meloxicam (MOBIC) 15 MG tablet Take 15 mg by mouth daily.     ondansetron (ZOFRAN) 8 MG tablet Take 1 tablet (8 mg total) by mouth every 8 (eight) hours as needed for nausea or vomiting. 20 tablet 0   No current facility-administered medications for this visit.    Allergies:   Doxycycline, Neosporin [bacitracin-polymyxin b], and Penicillins    Social History:  The patient  reports that he has been smoking cigars. He has never used smokeless tobacco. He reports current alcohol use of about 1.0 standard drink of alcohol per week. He reports that he does not use drugs.   Family History:  The patient's family history includes Diabetes in his mother; Hyperlipidemia in his mother; Prostate cancer in his brother; Skin cancer in his father.    ROS:  Please see the history of present illness.   Otherwise,  review of systems are positive for difficulty losing weight.   All other systems are reviewed and negative.    PHYSICAL EXAM: VS:  BP (!) 102/54   Pulse (!) 57   Ht 6\' 1"  (1.854 m)   Wt 227 lb (103 kg)   SpO2 95%   BMI 29.95 kg/m  , BMI Body mass index is 29.95 kg/m. GEN: Well nourished, well developed, in no acute distress HEENT: normal Neck: no JVD, carotid bruits, or masses Cardiac: RRR; no murmurs, rubs, or gallops,no edema  Respiratory:  clear to auscultation bilaterally, normal work of breathing GI: soft, nontender, nondistended, + BS MS: no deformity or atrophy Skin:  warm and dry, no rash Neuro:  Strength and sensation are intact Psych: euthymic mood, full affect   EKG:   The ekg ordered today demonstrates NSR, no ST changes   Recent Labs: 03/04/2022: B Natriuretic Peptide 169.6; Hemoglobin 13.0; Magnesium 2.1; Platelets 265 03/05/2022: BUN 14; Creatinine, Ser 1.06; Potassium 3.9; Sodium 137; TSH 1.526   Lipid Panel    Component Value Date/Time   CHOL 210 (H) 12/27/2012 1109   TRIG 142 12/27/2012 1109   HDL 56 12/27/2012 1109   CHOLHDL 3.8 12/27/2012 1109   LDLCALC 126 (H) 12/27/2012 1109     Other studies Reviewed: Additional studies/ records that were reviewed today with results demonstrating: labs reviewed.   ASSESSMENT AND PLAN:  Aortic stenosis: Moderate by January 2024 echocardiogram.  No sx of severe AS. Continue routine f/u.   SVT: Heart rate has been as high as the 170s when he had episodes.  Managed currently with Toprol-XL 50 mg daily.  Symptoms well-controlled. if symptoms worsen, would have to consider EP referral. Carotid artery disease: Mild in 2014.  Also had aortic atherosclerosis noted on a prior CT scan. Hyperlipidemia: LDL 130 in May 2023.  In 2024 total cholesterol 230 and HDL 53 triglycerides 200.  Has not tried a statin.   I recommended trying rosuvastatin 10 mg daily followed by liver and lipid blood tests in about 3  months.    Current medicines are reviewed at length with the patient today.  The patient concerns regarding his medicines were addressed.  The following changes have been made:  rosuvastatin 10 mg daily  Labs/ tests ordered today include: liver and lipids in 3 months No orders of the defined types were placed in this encounter.   Recommend 150 minutes/week of aerobic exercise Low fat, low carb, high fiber diet recommended  Disposition:   FU in 6 month   Signed, Lance Muss, MD  11/06/2022 10:04 AM    [facility] [address] [place], [place], [place]  [address] Phone: [phone]; Fax: [phone]

## 2022-12-06 ENCOUNTER — Ambulatory Visit (INDEPENDENT_AMBULATORY_CARE_PROVIDER_SITE_OTHER): Payer: HMO | Admitting: Podiatry

## 2022-12-06 ENCOUNTER — Encounter: Payer: Self-pay | Admitting: Podiatry

## 2022-12-06 DIAGNOSIS — M79675 Pain in left toe(s): Secondary | ICD-10-CM | POA: Diagnosis not present

## 2022-12-06 DIAGNOSIS — B351 Tinea unguium: Secondary | ICD-10-CM | POA: Diagnosis not present

## 2022-12-06 DIAGNOSIS — M79674 Pain in right toe(s): Secondary | ICD-10-CM | POA: Diagnosis not present

## 2022-12-06 NOTE — Progress Notes (Signed)
   Chief Complaint  Patient presents with   Debridement    Trim toenails/calluses    SUBJECTIVE Patient presents to office today complaining of elongated, thickened nails that cause pain while ambulating in shoes.  Patient is unable to trim their own nails. Patient is here for further evaluation and treatment.  Past Medical History:  Diagnosis Date   Allergic rhinitis, cause unspecified    Anxiety    Aortic valve calcification    Cancer (HCC)    Prostate CA, Dr Annabell Howells 22 years ago   Constipation    Depressive disorder    Dyspepsia and other specified disorders of function of stomach    GERD (gastroesophageal reflux disease)    Hypoglycemia    Memory loss    Otitis    externa of right ear   Prostate cancer (HCC)    Spinal stenosis    Unspecified pruritic disorder     Allergies  Allergen Reactions   Doxycycline     Other reaction(s): burning on tongue   Neosporin [Bacitracin-Polymyxin B] Other (See Comments)    Redness   Penicillins Rash    Has patient had a PCN reaction causing immediate rash, facial/tongue/throat swelling, SOB or lightheadedness with hypotension: No Has patient had a PCN reaction causing severe rash involving mucus membranes or skin necrosis: No Has patient had a PCN reaction that required hospitalization: No Has patient had a PCN reaction occurring within the last 10 years: No If all of the above answers are "NO", then may proceed with Cephalosporin use.     OBJECTIVE General Patient is awake, alert, and oriented x 3 and in no acute distress. Derm Skin is dry and supple bilateral. Negative open lesions or macerations. Remaining integument unremarkable. Nails are tender, long, thickened and dystrophic with subungual debris, consistent with onychomycosis, 1-5 bilateral. No signs of infection noted. Vasc  DP and PT pedal pulses palpable bilaterally. Temperature gradient within normal limits.  Neuro Epicritic and protective threshold sensation grossly  intact bilaterally.  Musculoskeletal Exam No symptomatic pedal deformities noted bilateral. Muscular strength within normal limits.  ASSESSMENT 1.  Pain due to onychomycosis of toenails both  PLAN OF CARE 1. Patient evaluated today.  2. Instructed to maintain good pedal hygiene and foot care.  3. Mechanical debridement of nails 1-5 bilaterally performed using a nail nipper. Filed with dremel without incident.  4. Return to clinic in 3 mos.    Felecia Shelling, DPM [facility]  Dr. Felecia Shelling, DPM    [address] [place], [place] [address]                Office [phone]  Fax [phone]

## 2023-01-03 DIAGNOSIS — R197 Diarrhea, unspecified: Secondary | ICD-10-CM | POA: Diagnosis not present

## 2023-01-03 DIAGNOSIS — D500 Iron deficiency anemia secondary to blood loss (chronic): Secondary | ICD-10-CM | POA: Diagnosis not present

## 2023-01-03 DIAGNOSIS — R531 Weakness: Secondary | ICD-10-CM | POA: Diagnosis not present

## 2023-01-03 DIAGNOSIS — K922 Gastrointestinal hemorrhage, unspecified: Secondary | ICD-10-CM | POA: Diagnosis not present

## 2023-01-03 DIAGNOSIS — R2689 Other abnormalities of gait and mobility: Secondary | ICD-10-CM | POA: Diagnosis not present

## 2023-01-03 DIAGNOSIS — Z6830 Body mass index (BMI) 30.0-30.9, adult: Secondary | ICD-10-CM | POA: Diagnosis not present

## 2023-01-08 ENCOUNTER — Inpatient Hospital Stay (HOSPITAL_COMMUNITY)
Admission: EM | Admit: 2023-01-08 | Discharge: 2023-01-10 | DRG: 377 | Disposition: A | Discharge status: Home / Self Care | Payer: HMO | Attending: Internal Medicine | Admitting: Internal Medicine

## 2023-01-08 ENCOUNTER — Inpatient Hospital Stay (HOSPITAL_COMMUNITY): Payer: HMO

## 2023-01-08 ENCOUNTER — Emergency Department (HOSPITAL_COMMUNITY): Payer: HMO

## 2023-01-08 ENCOUNTER — Other Ambulatory Visit: Payer: Self-pay

## 2023-01-08 ENCOUNTER — Encounter (HOSPITAL_COMMUNITY): Payer: Self-pay | Admitting: *Deleted

## 2023-01-08 DIAGNOSIS — Z808 Family history of malignant neoplasm of other organs or systems: Secondary | ICD-10-CM

## 2023-01-08 DIAGNOSIS — Z79899 Other long term (current) drug therapy: Secondary | ICD-10-CM

## 2023-01-08 DIAGNOSIS — I129 Hypertensive chronic kidney disease with stage 1 through stage 4 chronic kidney disease, or unspecified chronic kidney disease: Secondary | ICD-10-CM | POA: Diagnosis not present

## 2023-01-08 DIAGNOSIS — K5730 Diverticulosis of large intestine without perforation or abscess without bleeding: Secondary | ICD-10-CM | POA: Diagnosis present

## 2023-01-08 DIAGNOSIS — Z923 Personal history of irradiation: Secondary | ICD-10-CM | POA: Diagnosis not present

## 2023-01-08 DIAGNOSIS — F32A Depression, unspecified: Secondary | ICD-10-CM | POA: Diagnosis present

## 2023-01-08 DIAGNOSIS — D62 Acute posthemorrhagic anemia: Secondary | ICD-10-CM | POA: Diagnosis present

## 2023-01-08 DIAGNOSIS — K921 Melena: Secondary | ICD-10-CM | POA: Diagnosis not present

## 2023-01-08 DIAGNOSIS — D649 Anemia, unspecified: Principal | ICD-10-CM

## 2023-01-08 DIAGNOSIS — I350 Nonrheumatic aortic (valve) stenosis: Secondary | ICD-10-CM

## 2023-01-08 DIAGNOSIS — K5521 Angiodysplasia of colon with hemorrhage: Principal | ICD-10-CM | POA: Diagnosis present

## 2023-01-08 DIAGNOSIS — K219 Gastro-esophageal reflux disease without esophagitis: Secondary | ICD-10-CM | POA: Diagnosis present

## 2023-01-08 DIAGNOSIS — Z8546 Personal history of malignant neoplasm of prostate: Secondary | ICD-10-CM | POA: Diagnosis not present

## 2023-01-08 DIAGNOSIS — K922 Gastrointestinal hemorrhage, unspecified: Secondary | ICD-10-CM

## 2023-01-08 DIAGNOSIS — F419 Anxiety disorder, unspecified: Secondary | ICD-10-CM | POA: Diagnosis not present

## 2023-01-08 DIAGNOSIS — E538 Deficiency of other specified B group vitamins: Secondary | ICD-10-CM | POA: Insufficient documentation

## 2023-01-08 DIAGNOSIS — K6389 Other specified diseases of intestine: Secondary | ICD-10-CM | POA: Diagnosis not present

## 2023-01-08 DIAGNOSIS — N182 Chronic kidney disease, stage 2 (mild): Secondary | ICD-10-CM | POA: Diagnosis not present

## 2023-01-08 DIAGNOSIS — D500 Iron deficiency anemia secondary to blood loss (chronic): Secondary | ICD-10-CM | POA: Diagnosis not present

## 2023-01-08 DIAGNOSIS — K648 Other hemorrhoids: Secondary | ICD-10-CM | POA: Diagnosis not present

## 2023-01-08 DIAGNOSIS — Z9079 Acquired absence of other genital organ(s): Secondary | ICD-10-CM | POA: Diagnosis not present

## 2023-01-08 DIAGNOSIS — I214 Non-ST elevation (NSTEMI) myocardial infarction: Secondary | ICD-10-CM | POA: Diagnosis not present

## 2023-01-08 DIAGNOSIS — E785 Hyperlipidemia, unspecified: Secondary | ICD-10-CM | POA: Diagnosis present

## 2023-01-08 DIAGNOSIS — N170 Acute kidney failure with tubular necrosis: Secondary | ICD-10-CM | POA: Diagnosis not present

## 2023-01-08 DIAGNOSIS — R079 Chest pain, unspecified: Secondary | ICD-10-CM | POA: Diagnosis not present

## 2023-01-08 DIAGNOSIS — N183 Chronic kidney disease, stage 3 unspecified: Secondary | ICD-10-CM | POA: Diagnosis not present

## 2023-01-08 DIAGNOSIS — Z883 Allergy status to other anti-infective agents status: Secondary | ICD-10-CM

## 2023-01-08 DIAGNOSIS — Z8042 Family history of malignant neoplasm of prostate: Secondary | ICD-10-CM

## 2023-01-08 DIAGNOSIS — I471 Supraventricular tachycardia, unspecified: Secondary | ICD-10-CM | POA: Diagnosis present

## 2023-01-08 DIAGNOSIS — K644 Residual hemorrhoidal skin tags: Secondary | ICD-10-CM | POA: Diagnosis not present

## 2023-01-08 DIAGNOSIS — K52839 Microscopic colitis, unspecified: Secondary | ICD-10-CM | POA: Diagnosis present

## 2023-01-08 DIAGNOSIS — I700 Atherosclerosis of aorta: Secondary | ICD-10-CM | POA: Diagnosis not present

## 2023-01-08 DIAGNOSIS — I509 Heart failure, unspecified: Secondary | ICD-10-CM

## 2023-01-08 DIAGNOSIS — K6289 Other specified diseases of anus and rectum: Secondary | ICD-10-CM | POA: Diagnosis not present

## 2023-01-08 DIAGNOSIS — K625 Hemorrhage of anus and rectum: Secondary | ICD-10-CM | POA: Diagnosis not present

## 2023-01-08 DIAGNOSIS — Z83438 Family history of other disorder of lipoprotein metabolism and other lipidemia: Secondary | ICD-10-CM

## 2023-01-08 DIAGNOSIS — R0602 Shortness of breath: Secondary | ICD-10-CM | POA: Diagnosis not present

## 2023-01-08 DIAGNOSIS — I2510 Atherosclerotic heart disease of native coronary artery without angina pectoris: Secondary | ICD-10-CM | POA: Diagnosis not present

## 2023-01-08 DIAGNOSIS — I080 Rheumatic disorders of both mitral and aortic valves: Secondary | ICD-10-CM | POA: Diagnosis not present

## 2023-01-08 DIAGNOSIS — Z79890 Hormone replacement therapy (postmenopausal): Secondary | ICD-10-CM | POA: Diagnosis not present

## 2023-01-08 DIAGNOSIS — F17290 Nicotine dependence, other tobacco product, uncomplicated: Secondary | ICD-10-CM | POA: Diagnosis present

## 2023-01-08 DIAGNOSIS — Z880 Allergy status to penicillin: Secondary | ICD-10-CM

## 2023-01-08 DIAGNOSIS — I110 Hypertensive heart disease with heart failure: Secondary | ICD-10-CM | POA: Diagnosis not present

## 2023-01-08 DIAGNOSIS — N1831 Chronic kidney disease, stage 3a: Secondary | ICD-10-CM | POA: Insufficient documentation

## 2023-01-08 DIAGNOSIS — Z833 Family history of diabetes mellitus: Secondary | ICD-10-CM

## 2023-01-08 DIAGNOSIS — Z881 Allergy status to other antibiotic agents status: Secondary | ICD-10-CM

## 2023-01-08 DIAGNOSIS — R7989 Other specified abnormal findings of blood chemistry: Secondary | ICD-10-CM | POA: Diagnosis present

## 2023-01-08 DIAGNOSIS — I25119 Atherosclerotic heart disease of native coronary artery with unspecified angina pectoris: Secondary | ICD-10-CM | POA: Diagnosis not present

## 2023-01-08 LAB — BASIC METABOLIC PANEL
Anion gap: 9 (ref 5–15)
BUN: 21 mg/dL (ref 8–23)
CO2: 20 mmol/L — ABNORMAL LOW (ref 22–32)
Calcium: 9 mg/dL (ref 8.9–10.3)
Chloride: 109 mmol/L (ref 98–111)
Creatinine, Ser: 1.31 mg/dL — ABNORMAL HIGH (ref 0.61–1.24)
GFR, Estimated: 55 mL/min — ABNORMAL LOW (ref >60)
Glucose, Bld: 103 mg/dL — ABNORMAL HIGH (ref 70–99)
Potassium: 4.1 mmol/L (ref 3.5–5.1)
Sodium: 138 mmol/L (ref 135–145)

## 2023-01-08 LAB — CBC
HCT: 23.5 % — ABNORMAL LOW (ref 39.0–52.0)
Hemoglobin: 7.8 g/dL — ABNORMAL LOW (ref 13.0–17.0)
MCH: 34.2 pg — ABNORMAL HIGH (ref 26.0–34.0)
MCHC: 33.2 g/dL (ref 30.0–36.0)
MCV: 103.1 fL — ABNORMAL HIGH (ref 80.0–100.0)
Platelets: 222 10*3/uL (ref 150–400)
RBC: 2.28 MIL/uL — ABNORMAL LOW (ref 4.22–5.81)
RDW: 13.5 % (ref 11.5–15.5)
WBC: 4.6 10*3/uL (ref 4.0–10.5)
nRBC: 0 % (ref 0.0–0.2)

## 2023-01-08 LAB — TSH: TSH: 3.841 u[IU]/mL (ref 0.350–4.500)

## 2023-01-08 LAB — TROPONIN I (HIGH SENSITIVITY)
Troponin I (High Sensitivity): 310 ng/L (ref <18)
Troponin I (High Sensitivity): 1025 ng/L (ref <18)
Troponin I (High Sensitivity): 1919 ng/L (ref <18)
Troponin I (High Sensitivity): 1921 ng/L (ref <18)
Troponin I (High Sensitivity): 1840 ng/L (ref <18)

## 2023-01-08 LAB — ECHOCARDIOGRAM COMPLETE
AR max vel: 0.94 cm2
AV Area VTI: 0.95 cm2
AV Area mean vel: 0.97 cm2
AV Mean grad: 29 mm[Hg]
AV Peak grad: 43.6 mm[Hg]
AV Vena cont: 0.4 cm
Ao pk vel: 3.3 m/s
Area-P 1/2: 3.91 cm2
Height: 73 in
P 1/2 time: 638 ms
S' Lateral: 2.9 cm
Weight: 3633.18 [oz_av]

## 2023-01-08 LAB — HEPATIC FUNCTION PANEL
ALT: 14 U/L (ref 0–44)
AST: 22 U/L (ref 15–41)
Albumin: 3.6 g/dL (ref 3.5–5.0)
Alkaline Phosphatase: 50 U/L (ref 38–126)
Bilirubin, Direct: <0.1 mg/dL (ref 0.0–0.2)
Total Bilirubin: 0.3 mg/dL (ref <1.2)
Total Protein: 6.3 g/dL — ABNORMAL LOW (ref 6.5–8.1)

## 2023-01-08 LAB — HEMOGLOBIN A1C
Hgb A1c MFr Bld: 4.9 % (ref 4.8–5.6)
Mean Plasma Glucose: 93.93 mg/dL

## 2023-01-08 LAB — ABO/RH: ABO/RH(D): A POS

## 2023-01-08 LAB — LIPID PANEL
Cholesterol: 147 mg/dL (ref 0–200)
HDL: 72 mg/dL (ref >40)
LDL Cholesterol: 65 mg/dL (ref 0–99)
Total CHOL/HDL Ratio: 2 {ratio}
Triglycerides: 51 mg/dL (ref <150)
VLDL: 10 mg/dL (ref 0–40)

## 2023-01-08 LAB — HEMOGLOBIN AND HEMATOCRIT, BLOOD
HCT: 27.4 % — ABNORMAL LOW (ref 39.0–52.0)
HCT: 25.7 % — ABNORMAL LOW (ref 39.0–52.0)
Hemoglobin: 8.7 g/dL — ABNORMAL LOW (ref 13.0–17.0)
Hemoglobin: 8.5 g/dL — ABNORMAL LOW (ref 13.0–17.0)

## 2023-01-08 LAB — IRON AND TIBC
Iron: 55 ug/dL (ref 45–182)
Saturation Ratios: 15 % — ABNORMAL LOW (ref 17.9–39.5)
TIBC: 377 ug/dL (ref 250–450)
UIBC: 322 ug/dL

## 2023-01-08 LAB — TRANSFERRIN: Transferrin: 269 mg/dL (ref 180–329)

## 2023-01-08 LAB — POC OCCULT BLOOD, ED: Fecal Occult Bld: POSITIVE — AB

## 2023-01-08 LAB — VITAMIN B12: Vitamin B-12: 129 pg/mL — ABNORMAL LOW (ref 180–914)

## 2023-01-08 LAB — FOLATE: Folate: 8.5 ng/mL (ref >5.9)

## 2023-01-08 LAB — LACTATE DEHYDROGENASE: LDH: 192 U/L (ref 98–192)

## 2023-01-08 LAB — BRAIN NATRIURETIC PEPTIDE: B Natriuretic Peptide: 610 pg/mL — ABNORMAL HIGH (ref 0.0–100.0)

## 2023-01-08 LAB — CBG MONITORING, ED: Glucose-Capillary: 95 mg/dL (ref 70–99)

## 2023-01-08 LAB — PREPARE RBC (CROSSMATCH)

## 2023-01-08 MED ORDER — ASPIRIN 81 MG PO CHEW
162.0000 mg | CHEWABLE_TABLET | Freq: Once | ORAL | Status: DC
  Filled 2023-01-08: qty 2

## 2023-01-08 MED ORDER — ROSUVASTATIN CALCIUM 20 MG PO TABS
20.0000 mg | ORAL_TABLET | Freq: Every day | ORAL | Status: DC
  Administered 2023-01-08 – 2023-01-10 (×3): 20 mg via ORAL
  Filled 2023-01-08 (×3): qty 1

## 2023-01-08 MED ORDER — VITAMIN B-12 1000 MCG PO TABS
1000.0000 ug | ORAL_TABLET | Freq: Every day | ORAL | Status: DC
  Administered 2023-01-08 – 2023-01-10 (×3): 1000 ug via ORAL
  Filled 2023-01-08 (×3): qty 1

## 2023-01-08 MED ORDER — APALUTAMIDE 60 MG PO TABS: 240.0000 mg | ORAL_TABLET | Freq: Every day | ORAL | Status: DC

## 2023-01-08 MED ORDER — FUROSEMIDE 10 MG/ML IJ SOLN
20.0000 mg | Freq: Once | INTRAMUSCULAR | Status: AC
  Administered 2023-01-08: 20 mg via INTRAVENOUS
  Filled 2023-01-08: qty 2

## 2023-01-08 MED ORDER — RELUGOLIX 120 MG PO TABS: 120.0000 mg | ORAL_TABLET | Freq: Every day | ORAL | Status: DC

## 2023-01-08 MED ORDER — METOPROLOL SUCCINATE ER 50 MG PO TB24
50.0000 mg | ORAL_TABLET | Freq: Every day | ORAL | Status: DC
  Administered 2023-01-08 – 2023-01-10 (×3): 50 mg via ORAL
  Filled 2023-01-08: qty 1
  Filled 2023-01-08: qty 2
  Filled 2023-01-08: qty 1

## 2023-01-08 MED ORDER — BUDESONIDE 3 MG PO CPEP
9.0000 mg | ORAL_CAPSULE | Freq: Every day | ORAL | Status: DC
  Administered 2023-01-08 – 2023-01-10 (×3): 9 mg via ORAL
  Filled 2023-01-08 (×3): qty 3

## 2023-01-08 MED ORDER — ASPIRIN 81 MG PO CHEW: 324.0000 mg | CHEWABLE_TABLET | Freq: Once | ORAL | Status: DC

## 2023-01-08 MED ORDER — ASPIRIN 81 MG PO CHEW
324.0000 mg | CHEWABLE_TABLET | Freq: Once | ORAL | Status: DC
  Filled 2023-01-08: qty 4

## 2023-01-08 MED ORDER — PAROXETINE HCL 20 MG PO TABS
20.0000 mg | ORAL_TABLET | Freq: Every morning | ORAL | Status: DC
  Administered 2023-01-08 – 2023-01-10 (×3): 20 mg via ORAL
  Filled 2023-01-08 (×3): qty 1

## 2023-01-08 MED ORDER — ACETAMINOPHEN 500 MG PO TABS: 1000.0000 mg | ORAL_TABLET | Freq: Four times a day (QID) | ORAL | Status: DC | PRN

## 2023-01-08 MED ORDER — SODIUM CHLORIDE 0.9% FLUSH
3.0000 mL | Freq: Two times a day (BID) | INTRAVENOUS | Status: DC
  Administered 2023-01-08 – 2023-01-09 (×3): 3 mL via INTRAVENOUS

## 2023-01-08 MED ORDER — SODIUM CHLORIDE 0.9% IV SOLUTION: Freq: Once | INTRAVENOUS | Status: AC

## 2023-01-08 NOTE — ED Provider Notes (Signed)
[place] EMERGENCY DEPARTMENT AT [facility] Provider Note   CSN: [id number] Arrival date & time: 01/08/23  0151     History  Chief Complaint  Patient presents with   Shortness of Breath    Logan Wolfe is a 77 y.o. male.  The history is provided by the patient.  Patient with history of aortic stenosis, prostate cancer presents with shortness of breath.  Patient reports over the past several days he has had increasing shortness of breath.  He is now having fatigue and dyspnea on exertion of walking up steps.  He also reports some chest tightness.  Symptoms have worsened tonight so he came to the ER for evaluation.  No active chest pain at this time.  No fevers, no cough    Past Medical History:  Diagnosis Date   Allergic rhinitis, cause unspecified    Anxiety    Aortic valve calcification    Cancer (HCC)    Prostate CA, Dr Annabell Howells 22 years ago   Constipation    Depressive disorder    Dyspepsia and other specified disorders of function of stomach    GERD (gastroesophageal reflux disease)    Hypoglycemia    Memory loss    Otitis    externa of right ear   Prostate cancer (HCC)    Spinal stenosis    Unspecified pruritic disorder     Home Medications Prior to Admission medications   Medication Sig Start Date End Date Taking? Authorizing Provider  apalutamide (ERLEADA) 60 MG tablet Take 240 mg by mouth daily.   Yes [provider]  budesonide (ENTOCORT EC) 3 MG 24 hr capsule Take 6-12 mg by mouth See admin instructions. Take 3 CAPSULES BY MOUTH DAILY FOR 4 WEEKS THEN 2 CAPSULES BY MOUTH DAILY FOR 2 WEEKS THEN 1 CAPSULE FOR 2 WEEKS   Yes [provider]  fluticasone (FLONASE) 50 MCG/ACT nasal spray Place 2 sprays into both nostrils daily as needed for allergies.   Yes [provider]  iron polysaccharides (NIFEREX) 150 MG capsule Take 150 mg by mouth 2 (two) times daily. 01/03/23  Yes [provider]  Melatonin 10 MG SUBL Take  10 mg by mouth at bedtime.   Yes [provider]  metoprolol succinate (TOPROL-XL) 50 MG 24 hr tablet Take 1 tablet (50 mg total) by mouth daily. Take with or immediately following a meal. 03/17/22  Yes Corky Crafts, MD  ondansetron (ZOFRAN) 8 MG tablet Take 1 tablet (8 mg total) by mouth every 8 (eight) hours as needed for nausea or vomiting. 04/19/22  Yes Erven Colla, PA-C  oxybutynin (DITROPAN) 5 MG tablet Take 5 mg by mouth 2 (two) times daily as needed. 06/16/22  Yes [provider]  PARoxetine (PAXIL) 20 MG tablet Take 20 mg by mouth every morning. 11/23/22  Yes [provider]  polyethylene glycol (MIRALAX / GLYCOLAX) 17 g packet Take 17 g by mouth daily as needed for mild constipation. 12/31/15  Yes [provider]  relugolix (ORGOVYX) 120 MG tablet Take 120 mg by mouth daily.   Yes [provider]  rosuvastatin (CRESTOR) 10 MG tablet Take 1 tablet (10 mg total) by mouth daily. 11/06/22  Yes Corky Crafts, MD  megestrol (MEGACE) 20 MG tablet Take 1-3 tablets (20-60 mg total) by mouth daily. Patient not taking: Reported on 01/08/2023 04/20/22   Margaretmary Dys, MD      Allergies    Doxycycline, Neosporin [bacitracin-polymyxin b], and Penicillins  Review of Systems   Review of Systems  Constitutional:  Negative for fever.  Respiratory:  Positive for shortness of breath.   Cardiovascular:  Negative for leg swelling.    Physical Exam Updated Vital Signs BP 122/61 (BP Location: Right Arm)   Pulse 64   Temp (!) 97.3 F (36.3 C) (Oral)   Resp (!) 24   Ht 1.854 m (6\' 1" )   Wt 103 kg   SpO2 100%   BMI 29.96 kg/m  Physical Exam CONSTITUTIONAL: Elderly, ill-appearing HEAD: Normocephalic/atraumatic ENMT: Mucous membranes moist NECK: supple no meningeal signs CV: S1/S2 noted, no heart sounds or murmurs are noted LUNGS: Tachypnea, decreased breath sounds bilaterally but overall clear ABDOMEN: soft, nontender Rectal - bright  red blood, no mass/abscess, no hemorrhoids, nurse present for exam NEURO: Pt is awake/alert/appropriate, moves all extremitiesx4.  No facial droop.   EXTREMITIES: pulses normal/equal, full ROM, no lower extremity edema SKIN: warm, color normal PSYCH anxious ED Results / Procedures / Treatments   Labs (all labs ordered are listed, but only abnormal results are displayed) Labs Reviewed  BASIC METABOLIC PANEL - Abnormal; Notable for the following components:      Result Value   CO2 20 (*)    Glucose, Bld 103 (*)    Creatinine, Ser 1.31 (*)    GFR, Estimated 55 (*)    All other components within normal limits  CBC - Abnormal; Notable for the following components:   RBC 2.28 (*)    Hemoglobin 7.8 (*)    HCT 23.5 (*)    MCV 103.1 (*)    MCH 34.2 (*)    All other components within normal limits  BRAIN NATRIURETIC PEPTIDE - Abnormal; Notable for the following components:   B Natriuretic Peptide 610.0 (*)    All other components within normal limits  POC OCCULT BLOOD, ED - Abnormal; Notable for the following components:   Fecal Occult Bld POSITIVE (*)    All other components within normal limits  TROPONIN I (HIGH SENSITIVITY) - Abnormal; Notable for the following components:   Troponin I (High Sensitivity) 310 (*)    All other components within normal limits  CBG MONITORING, ED  TYPE AND SCREEN  ABO/RH  TROPONIN I (HIGH SENSITIVITY)    EKG EKG Interpretation Date/Time:  Monday January 08 2023 03:37:44 EST Ventricular Rate:  59 PR Interval:  205 QRS Duration:  96 QT Interval:  444 QTC Calculation: 440 R Axis:   45  Text Interpretation: Sinus rhythm RSR' in V1 or V2, probably normal variant Borderline ST depression, diffuse leads Confirmed by Zadie Rhine ([id number]) on 01/08/2023 4:48:09 AM  Radiology DG Chest Portable 1 View  Result Date: 01/08/2023 CLINICAL DATA:  Shortness of breath and chest pain EXAM: PORTABLE CHEST 1 VIEW COMPARISON:  Radiograph 03/04/2022 FINDINGS:  Stable cardiomediastinal silhouette. Aortic atherosclerotic calcification. Diffuse interstitial coarsening greatest in the mid and lower lungs is new since 03/04/2022. No focal consolidation, pleural effusion, or pneumothorax. No displaced rib fractures. IMPRESSION: Diffuse interstitial coarsening may be due to atypical infection or edema. Electronically Signed   By: Minerva Fester M.D.   On: 01/08/2023 03:17    Procedures .Critical Care  Performed by: Zadie Rhine, MD Authorized by: Zadie Rhine, MD   Critical care provider statement:    Critical care time (minutes):  30   Critical care start time:  01/08/2023 4:00 AM   Critical care end time:  01/08/2023 4:30 AM   Critical care time was exclusive of:  Separately  billable procedures and treating other patients   Critical care was time spent personally by me on the following activities:  Obtaining history from patient or surrogate, examination of patient, discussions with consultants, development of treatment plan with patient or surrogate, pulse oximetry, ordering and review of radiographic studies and ordering and review of laboratory studies   I assumed direction of critical care for this patient from another provider in my specialty: no     Care discussed with: admitting provider       Medications Ordered in ED Medications - No data to display   ED Course/ Medical Decision Making/ A&P Clinical Course as of 01/08/23 0449  Mon Jan 08, 2023  0230 Patient presents for chest tightness and shortness of breath and increasing dyspnea on exertion.  He appears tachypneic but overall vitals are appropriate.  No active chest pain at this time.  Labs and imaging are pending at this time [DW]  0321 Hemoglobin(!): 7.8 Acute anemia [DW]  0321 Patient now reports has had intermittent rectal bleeding since June.  He is scheduled for gastroenterology next week ([facility]) This is likely contributing to his symptoms. [DW]  7829 Creatinine(!):  1.31 Renal insufficiency [DW]  5621 Discussed the case with Dr. Laurelyn Sickle with cardiology.  He has reviewed EKGs and labs.  Due to underlying GI bleed with acute anemia, he recommends hospitalist admission and first priority treating the bleeding.  Cardiology can be reconsulted, and they also recommend echocardiogram. [DW]  [phone] Discussed with Dr. Lazarus Salines for admit He may consider diuresis then giving blood transfusion. [DW]    Clinical Course User Index [DW] Zadie Rhine, MD                                 Medical Decision Making Amount and/or Complexity of Data Reviewed Labs: ordered. Decision-making details documented in ED Course. Radiology: ordered. ECG/medicine tests: ordered.  Risk OTC drugs. Decision regarding hospitalization.   This patient presents to the ED for concern of shortness of breath, this involves an extensive number of treatment options, and is a complaint that carries with it a high risk of complications and morbidity.  The differential diagnosis includes but is not limited to Acute coronary syndrome, pneumonia, acute pulmonary edema, pneumothorax, acute anemia, pulmonary embolism    Comorbidities that complicate the patient evaluation: Patient's presentation is complicated by their history of aortic stenosis and prostate cancer  Social Determinants of Health: Patient's  previous tobacco use   increases the complexity of managing their presentation  Additional history obtained: Additional history obtained from spouse Records reviewed  cardiology notes reviewed  Lab Tests: I Ordered, and personally interpreted labs.  The pertinent results include: Acute anemia  Imaging Studies ordered: I ordered imaging studies including X-ray chest   I independently visualized and interpreted imaging which showed pulmonary edema I agree with the radiologist interpretation  Cardiac Monitoring: The patient was maintained on a cardiac monitor.  I personally viewed and  interpreted the cardiac monitor which showed an underlying rhythm of:  sinus rhythm   Critical Interventions:   admission and monitoring  Consultations Obtained: I requested consultation with the admitting physician [facility] and consultant cardiology , and discussed  findings as well as pertinent plan - they recommend: Admit  Reevaluation: After the interventions noted above, I reevaluated the patient and found that they have :improved  Complexity of problems addressed: Patient's presentation is most consistent with  acute presentation  with potential threat to life or bodily function  Disposition: After consideration of the diagnostic results and the patient's response to treatment,  I feel that the patent would benefit from admission   .           Final Clinical Impression(s) / ED Diagnoses Final diagnoses:  Acute anemia  Acute congestive heart failure, unspecified heart failure type [facility])  Rectal bleeding    Rx / DC Orders ED Discharge Orders     None         Zadie Rhine, MD 01/08/23 [phone]

## 2023-01-08 NOTE — Progress Notes (Addendum)
PROGRESS NOTE    Logan Wolfe  OZD:[id number] DOB: Oct 17, 1945 DOA: 01/08/2023 PCP: Daisy Floro, MD    Brief Narrative:   Logan Wolfe is a 77 y.o. male with past medical history significant for prostate cancer s/p resection, radiation therapy last February 2024 on hormonal therapy currently, moderate aortic stenosis, mitral regurgitation, CAD, history of SVT, CKD stage II, HLD, GERD who presents to [facility] ED on 11/25 with progressive shortness of breath over the last 2 weeks.  Dyspnea worse with exertion, now unable to walk 15 feet without stopping to catch his breath.  Also reports associated chest tightness during exertion as well with worsening of the symptoms over the last 3 days.  Given his progressive symptoms, he was worried he would not wake up from sleep so sought further care in the ED.  Additionally patient reports ongoing rectal bleeding since June 2024.  Reports as bright red blood.  Following with gastroenterology outpatient; initially worked up with outpatient stool studies that were reported negative and then was placed on budesonide for concerns of his symptoms are related to an inflammatory response versus complication from his radiation from prostate cancer.  Despite use of budesonide, his symptoms continued also associated with some mild dizziness.  Patient endorses use of aspirin, not on any other anticoagulants/antiplatelets.  Denies orthopnea, no lower extremity edema, no cough/congestion.  In the ED, temperature 97.3 F, HR 64, RR 24, BP 122/61, SpO2 100% on room air.  WBC 4.6, hemoglobin 7.8, platelet count 222.  Sodium 138, potassium 4.1, chloride 109, CO2 20, glucose 103, BUN 21, creat 1.31.  BNP 610.0.  High sensitive troponin 310 followed by 1025. TSH 3.841. FOBT positive.  Anemia panel with iron 55, TIBC 377, folate 8.5, vitamin B12 129.  Chest x-ray with diffuse interstitial coarsening may be due to atypical infection versus edema.  EDP consulted cardiology and  gastroenterology.  1 unit PRBC ordered.  TRH consulted for admission for further evaluation and management of GI bleed, elevated troponin  Assessment & Plan:   Lower GI bleed Presenting with persistent bright red blood per rectum since June 2024.  Has been seen by GI outpatient with reported negative stool studies for infectious etiology and was placed on budesonide.  Despite treatment with budesonide symptoms have continued to persist; with hemoglobin noted to be low at 7.8 on admission; baseline hemoglobin 13.0 03/04/2022. -- [facility] consulted -- transfuse 1u pRBC -- Repeat H&H 2 hours following transfusion; every 8 hours thereafter -- N.p.o. until GI evaluation -- Transfuse for hemoglobin less than 8.0 given cardiac history -- Monitor on telemetry  B12 deficiency Vitamin B12 low at 129. --Vitamin B12 1000 mcg p.o. daily  Elevated troponin likely secondary to type II demand ischemia in the setting of GI bleed Hx CAD Patient endorses progressive dyspnea, chest pain with exertion.  Likely complicated in the setting of acute on chronic blood loss secondary to GI bleed ongoing since June 2024 as above.  Patient currently does not endorse chest pain.  Troponins initially elevated at 310 on admission, up trended to 1025. -- Cardiology following, appreciate assistance -- TTE: Pending -- Continue to trend troponin -- Cardiology to consider LHC after GI bleeding addressed/resolved -- Monitor on telemetry  History of moderate aortic stenosis/mitral regurgitation -- TTE: Pending as above  Hx SVT -- Metoprolol succinate 50 mg p.o. daily  Acute renal failure on CKD stage II Creatinine 1.31 on admission.  Baseline creatinine 1-1.1.  Etiology likely secondary to ATN from  acute blood loss anemia as above with GI bleed. -- Transfusing 1 unit PRBC as above -- Avoid nephrotoxins, renal dose all medications -- BMP daily  Hyperlipidemia Lipid profile with total cholesterol 147, HDL 72, LDL 65,  triglycerides 51. -- Crestor 20 mg p.o. daily  Anxiety/depression -- Paxil 20 mg p.o. daily  History of prostate cancer s/p resection/radiation Follows with urology, Dr. Mena Goes outpatient.  Radiation, last February 2024 could be related to his ongoing bloody stools as above. --Continue outpatient follow-up with urology    DVT prophylaxis: SCDs Start: 01/08/23 0519    Code Status: Full Code Family Communication: No family present at bedside this morning  Disposition Plan:  Level of care: Progressive Status is: Inpatient Remains inpatient appropriate because: Active GI bleed, needs evaluation by GI, further cardiology recommendations, transfusing 1 unit PRBC and needs a stability of hemoglobin before ready for discharge home.  May end up needing left heart catheterization.    Consultants:  Cardiology [facility] gastroenterology  Procedures:  TTE: Pending  Antimicrobials:  None   Subjective: Patient seen examined bedside, resting calmly.  Sitting at edge of bed.  Remains in the ED holding area.  ED RN present at bedside.  Attempting to use urinal.  Patient reports been dealing with intermittent bright red blood per rectum since June 2024.  States he follows with [facility] gastroenterology, previously Dr. Randa Evens.  Has been in discussion with GI and underwent stool study that was reported negative for infection and was started on budesonide for concerns of radiation-induced colitis causing his blood in his stool.  Despite use of budesonide, patient reports continued bleeding.  Now experiencing dyspnea, chest pain with exertion and dizziness at times.  Being transfused 1 unit PRBC this morning.  Consulted [facility] for evaluation.  Patient with no other specific complaints, concerns, questions at this time.  Denies headache, no current dizziness, no current chest pain, no nausea/vomiting/diarrhea, no focal weakness, no cough/congestion, no fever/chills/night sweats, no fatigue, no paresthesias.   No acute events overnight per nursing staff.  Objective: Vitals:   01/08/23 0606 01/08/23 0700 01/08/23 0715 01/08/23 0906  BP:  135/64 124/64 (!) 153/76  Pulse:  69 66 64  Resp:  13 16 19   Temp: 98 F (36.7 C) 98 F (36.7 C) 98.2 F (36.8 C) 98.2 F (36.8 C)  TempSrc: Oral Oral Oral Oral  SpO2:   98% 100%  Weight:      Height:        Intake/Output Summary (Last 24 hours) at 01/08/2023 1004 Last data filed at 01/08/2023 0908 Gross per 24 hour  Intake 319.83 ml  Output --  Net 319.83 ml   Filed Weights   01/08/23 0210  Weight: 103 kg    Examination:  Physical Exam: GEN: NAD, alert and oriented x 3, elderly/chronically ill in appearance HEENT: NCAT, PERRL, EOMI, sclera clear, MMM PULM: Conversational dyspnea, speaking in short sentences, tachypneic, CTAB w/o wheezes/crackles, on room air CV: RRR w/ 3/6 RUSB SEM no gallop/rub GI: abd soft, NTND, NABS, no R/G/M MSK: 1+ edema BLE, muscle strength globally intact 5/5 bilateral upper/lower extremities NEURO: CN II-XII intact, no focal deficits, sensation to light touch intact PSYCH: normal mood/affect Integumentary: dry/intact, no rashes or wounds    Data Reviewed: I have personally reviewed following labs and imaging studies  CBC: Recent Labs  Lab 01/08/23 0224  WBC 4.6  HGB 7.8*  HCT 23.5*  MCV 103.1*  PLT 222   Basic Metabolic Panel: Recent Labs  Lab  01/08/23 0224  NA 138  K 4.1  CL 109  CO2 20*  GLUCOSE 103*  BUN 21  CREATININE 1.31*  CALCIUM 9.0   GFR: Estimated Creatinine Clearance: 57.6 mL/min (A) (by C-G formula based on SCr of 1.31 mg/dL (H)). Liver Function Tests: Recent Labs  Lab 01/08/23 0245  AST 22  ALT 14  ALKPHOS 50  BILITOT 0.3  PROT 6.3*  ALBUMIN 3.6   No results for input(s): "LIPASE", "AMYLASE" in the last 168 hours. No results for input(s): "AMMONIA" in the last 168 hours. Coagulation Profile: No results for input(s): "INR", "PROTIME" in the last 168 hours. Cardiac  Enzymes: No results for input(s): "CKTOTAL", "CKMB", "CKMBINDEX", "TROPONINI" in the last 168 hours. BNP (last 3 results) No results for input(s): "PROBNP" in the last 8760 hours. HbA1C: Recent Labs    01/08/23 0224  HGBA1C 4.9   CBG: Recent Labs  Lab 01/08/23 0223  GLUCAP 95   Lipid Profile: Recent Labs    01/08/23 0224  CHOL 147  HDL 72  LDLCALC 65  TRIG 51  CHOLHDL 2.0   Thyroid Function Tests: Recent Labs    01/08/23 0310  TSH 3.841   Anemia Panel: Recent Labs    01/08/23 0245 01/08/23 0310  VITAMINB12  --  129*  FOLATE  --  8.5  TIBC 377  --   IRON 55  --    Sepsis Labs: No results for input(s): "PROCALCITON", "LATICACIDVEN" in the last 168 hours.  No results found for this or any previous visit (from the past 240 hour(s)).       Radiology Studies: DG Chest Portable 1 View  Result Date: 01/08/2023 CLINICAL DATA:  Shortness of breath and chest pain EXAM: PORTABLE CHEST 1 VIEW COMPARISON:  Radiograph 03/04/2022 FINDINGS: Stable cardiomediastinal silhouette. Aortic atherosclerotic calcification. Diffuse interstitial coarsening greatest in the mid and lower lungs is new since 03/04/2022. No focal consolidation, pleural effusion, or pneumothorax. No displaced rib fractures. IMPRESSION: Diffuse interstitial coarsening may be due to atypical infection or edema. Electronically Signed   By: Minerva Fester M.D.   On: 01/08/2023 03:17        Scheduled Meds:  aspirin  162 mg Oral Once   budesonide  9 mg Oral Daily   metoprolol succinate  50 mg Oral Daily   PARoxetine  20 mg Oral q morning   rosuvastatin  20 mg Oral Daily   sodium chloride flush  3 mL Intravenous Q12H   Continuous Infusions:   LOS: 0 days    Time spent: 52 minutes spent on chart review, discussion with nursing staff, consultants, updating family and interview/physical exam; more than 50% of that time was spent in counseling and/or coordination of care.    Alvira Philips [place],  DO [facility] Available via Epic secure chat 7am-7pm After these hours, please refer to coverage provider listed on [email or web address] 01/08/2023, 10:04 AM

## 2023-01-08 NOTE — ED Notes (Signed)
Blood transfusion consent obtained.

## 2023-01-08 NOTE — ED Notes (Signed)
Home medications taken to main pharmacy.

## 2023-01-08 NOTE — H&P (Signed)
History and Physical    HIRVING Wolfe UUV:[id number] DOB: 1945/11/22 DOA: 01/08/2023  PCP: Daisy Floro, MD   Patient coming from: Home   Chief Complaint:  Chief Complaint  Patient presents with   Shortness of Breath    HPI:  Logan Wolfe is a 77 y.o. male with hx of metastatic prostate cancer with a history of remote prostate resection, radiation therapy last in 2/'24, currently on hormonal therapy, moderate aortic stenosis, MR, carotid artery disease, SVT, CKD 2, hyperlipidemia, GERD, who presents with 2 weeks of worsening shortness of breath.  He begins by telling me about rectal bleeding which has been ongoing since 6/' 24.  Per his report he has bright red rectal bleeding every time he uses the bathroom, it waxes and wanes in terms of severity sometimes he says filling up the toilet bowl with blood.  Appears that he has been on budesonide EC which he says has been prescribed by GI although has not been established with a GI provider yet.  He was planned to have an initial appointment with GI tomorrow.  Since onset of his symptoms he had not had an endoscopy, was seen by outpatient providers and referred to GI.  In the past 3 to 4 weeks his bleeding has worsened, having 3-4 episodes per day.  Then in the past 2 weeks he began having shortness of breath with exertion, unable to walk 15 feet without stopping to catch his breath.  He does have associated chest tightness during exertion as well. Symptoms have been especially worse in the past 3 days, and the evening prior to his presentation he was afraid that if he went to sleep he would not wake up so sought care in the ED.  He chewed 2 baby aspirin around midnight. Denies orthopnea, lower extremity edema.  No other recent illness.  He is not on anticoagulants.  He is short of breath during her interview but is not having active chest pain.   Review of Systems:  ROS complete and negative except as marked above   Allergies  Allergen  Reactions   Doxycycline     burning on tongue   Neosporin [Bacitracin-Polymyxin B] Other (See Comments)    Redness   Penicillins Rash    Has patient had a PCN reaction causing immediate rash, facial/tongue/throat swelling, SOB or lightheadedness with hypotension: No Has patient had a PCN reaction causing severe rash involving mucus membranes or skin necrosis: No Has patient had a PCN reaction that required hospitalization: No Has patient had a PCN reaction occurring within the last 10 years: No If all of the above answers are "NO", then may proceed with Cephalosporin use.    Prior to Admission medications   Medication Sig Start Date End Date Taking? Authorizing Provider  apalutamide (ERLEADA) 60 MG tablet Take 240 mg by mouth daily.   Yes [provider]  budesonide (ENTOCORT EC) 3 MG 24 hr capsule Take 6-12 mg by mouth See admin instructions. Take 3 CAPSULES BY MOUTH DAILY FOR 4 WEEKS THEN 2 CAPSULES BY MOUTH DAILY FOR 2 WEEKS THEN 1 CAPSULE FOR 2 WEEKS   Yes [provider]  fluticasone (FLONASE) 50 MCG/ACT nasal spray Place 2 sprays into both nostrils daily as needed for allergies.   Yes [provider]  iron polysaccharides (NIFEREX) 150 MG capsule Take 150 mg by mouth 2 (two) times daily. 01/03/23  Yes [provider]  Melatonin 10 MG SUBL Take 10 mg by mouth at  bedtime.   Yes [provider]  metoprolol succinate (TOPROL-XL) 50 MG 24 hr tablet Take 1 tablet (50 mg total) by mouth daily. Take with or immediately following a meal. 03/17/22  Yes Corky Crafts, MD  ondansetron (ZOFRAN) 8 MG tablet Take 1 tablet (8 mg total) by mouth every 8 (eight) hours as needed for nausea or vomiting. 04/19/22  Yes Erven Colla, PA-C  oxybutynin (DITROPAN) 5 MG tablet Take 5 mg by mouth 2 (two) times daily as needed. 06/16/22  Yes [provider]  PARoxetine (PAXIL) 20 MG tablet Take 20 mg by mouth every morning. 11/23/22  Yes [provider]  polyethylene glycol (MIRALAX / GLYCOLAX) 17 g packet Take 17 g by mouth daily as needed for mild constipation. 12/31/15  Yes [provider]  relugolix (ORGOVYX) 120 MG tablet Take 120 mg by mouth daily.   Yes [provider]  rosuvastatin (CRESTOR) 10 MG tablet Take 1 tablet (10 mg total) by mouth daily. 11/06/22  Yes Corky Crafts, MD  megestrol (MEGACE) 20 MG tablet Take 1-3 tablets (20-60 mg total) by mouth daily. Patient not taking: Reported on 01/08/2023 04/20/22   Margaretmary Dys, MD    Past Medical History:  Diagnosis Date   Allergic rhinitis, cause unspecified    Anxiety    Aortic valve calcification    Cancer [facility])    Prostate CA, Dr Annabell Howells 22 years ago   Constipation    Depressive disorder    Dyspepsia and other specified disorders of function of stomach    GERD (gastroesophageal reflux disease)    Hypoglycemia    Memory loss    Otitis    externa of right ear   Prostate cancer [facility])    Spinal stenosis    Unspecified pruritic disorder     Past Surgical History:  Procedure Laterality Date   PROSTATE SURGERY       reports that he has been smoking cigars. He has never used smokeless tobacco. He reports current alcohol use of about 1.0 standard drink of alcohol per week. He reports that he does not use drugs.  Family History  Problem Relation Age of Onset   Hyperlipidemia Mother    Diabetes Mother    Skin cancer Father        part of right ear removed   Prostate cancer Brother    Breast cancer Neg Hx    Colon cancer Neg Hx    Pancreatic cancer Neg Hx      Physical Exam: Vitals:   01/08/23 0530 01/08/23 0545 01/08/23 0600 01/08/23 0606  BP: 101/64 112/61 123/64   Pulse: 63 65 64   Resp: 17 17 15    Temp:    98 F (36.7 C)  TempSrc:    Oral  SpO2: 94% 94% 100%   Weight:      Height:        Gen: Awake, alert, acutely ill and chronically ill-appearing CV: Regular, normal S1, S2, 2/6 crescendo decrescendo murmur Resp: Speaking  in short sentences and tachypneic, when he is not talking at rest is normal work of breathing. Rales in the bases Abd: Flat, normoactive, nontender MSK: Symmetric, 2+ pitting edema to the knee Skin: No rashes or lesions to exposed skin  Neuro: Alert and interactive  Psych: euthymic, appropriate    Data review:   Labs reviewed, notable for:   Bicarb 20, anion gap 9 Creatinine 1.3, baseline 1 BNP 610, high-sensitivity troponin 310 -> 1025 Hemoglobin 7.8  macrocytic, other cell counts normal   Micro:  Results for orders placed or performed in visit on 01/02/19  Novel Coronavirus, NAA ([facility])     Status: None   Collection Time: 01/02/19 12:00 AM   Specimen: Nasopharyngeal(NP) swabs in vial transport medium   NASOPHARYNGE  TESTING  Result Value Ref Range Status   SARS-CoV-2, NAA Not Detected Not Detected Final    Comment: Testing was performed using the cobas(R) SARS-CoV-2 test. This nucleic acid amplification test was developed and its performance characteristics determined by [employer]. Nucleic acid amplification tests include PCR and TMA. This test has not been FDA cleared or approved. This test has been authorized by FDA under an Emergency Use Authorization (EUA). This test is only authorized for the duration of time the declaration that circumstances exist justifying the authorization of the emergency use of in vitro diagnostic tests for detection of SARS-CoV-2 virus and/or diagnosis of COVID-19 infection under section 564(b)(1) of the Act, 21 U.S.C. 161WRU-0(A) (1), unless the authorization is terminated or revoked sooner. When diagnostic testing is negative, the possibility of a false negative result should be considered in the context of a patient's recent exposures and the presence of clinical signs and symptoms consistent with COVID-19. An individual without symptoms  of COVID-19 and who is not shedding SARS-CoV-2 virus would expect to have a negative (not  detected) result in this assay.     Imaging reviewed:  DG Chest Portable 1 View  Result Date: 01/08/2023 CLINICAL DATA:  Shortness of breath and chest pain EXAM: PORTABLE CHEST 1 VIEW COMPARISON:  Radiograph 03/04/2022 FINDINGS: Stable cardiomediastinal silhouette. Aortic atherosclerotic calcification. Diffuse interstitial coarsening greatest in the mid and lower lungs is new since 03/04/2022. No focal consolidation, pleural effusion, or pneumothorax. No displaced rib fractures. IMPRESSION: Diffuse interstitial coarsening may be due to atypical infection or edema. Electronically Signed   By: Minerva Fester M.D.   On: 01/08/2023 03:17    EKG:  Sinus rhythm, slight scooping of ST segments laterally,  ED Course:  EDP did rectal exam with bright red blood noted.  Case was initially discussed with cardiology re: elevated troponin, felt to be likely demand event in the setting of GI bleeding and recommended management of this underlying condition.  With his worsening troponin trend I discussed again with cardiology and asked for formal consultation this morning.   Assessment/Plan:  78 y.o. male with hx metastatic prostate cancer with a history of remote prostate resection, radiation therapy last in 2/'24, currently on hormonal therapy, moderate aortic stenosis, MR, carotid artery disease, SVT, CKD 2, hyperlipidemia, GERD, who presents with 2 weeks of worsening shortness of breath, in setting of worsening chronic lower GI bleeding.  Found to have symptomatic anemia, acute myocardial injury and acute heart failure.  Acute myocardial injury Acute heart failure, previously preserved ejection fraction History of moderate AS Overall presenting symptoms of 2 wks exertional dyspnea difficult to attributed to either his cardiac issues versus symptomatic blood loss anemia. I suspect the anemia occurred first but may have caused demand ischemia, low output with AS leading to his heart failure, acute  myocardial injury and now multifactorial dyspnea.  See anemia below. BNP 610, high-sensitivity troponin uptrending 310 -> 1025.  EKG with minimal scooping of ST segments laterally.  Chest x-ray with interstitial infiltrates suspect pulmonary edema in the setting. Most recent TTE 1/24 with preserved EF, grade 2 diastolic dysfunction, moderate AS, mild AR, mild MR.  -Cardiology consult, discussed briefly with fellow overnight. Suspected demand  event with anemia, GI bleeding being precipitating cause.  Recommending management directed mainly at GI bleeding, obtain TTE. Cards day team to see this morning.  -Patient took aspirin 162 mg at home, give additional 162 mg aspirin considering his worsening troponin trend. - Hold off on heparin given GI bleeding -Give Lasix 20 mg IV for gentle diuresis and follow with blood transfusion for anemia -Increase rosuvastatin to 20 mg daily -Trend troponin to peak -Check lipids and A1c -TTE ordered -On my review his hormonal therapies for prostate cancer can be associated with major adverse cardiac events including MI.  Temporarily hold these medications as discussed below.  Acute on chronic lower GI bleeding Acute on chronic blood loss anemia, symptomatic Bright red rectal bleeding since 6/'24, worsened in past 3 weeks. Has not had endoscopy since onset.  Has been treated with budesonide EC empirically.  Hemoglobin baseline 13 last checked in 1/'24 -> 7.8 on admission.  Does have a history of radiation therapy for prostate cancer last in 3/'24.  Was planned to have an initial visit with [facility] tomorrow.  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 -GI consult, not sure if it should be unassigned messaged [facility] Dr. Tomasa Rand overnight.  May need to be switched over to [facility] considering planned outpatient visit.  -Ordered 1 unit RBC  -Transfusion threshold >8 given acute myocardial injury -Continue his  previously prescribed budesonide EC currently on 9 mg daily -Check iron panel, folate, B12, LDH, haptoglobin  History of metastatic prostate cancer, Follows with [facility]  s/p resection, radiation.  Currently on hormonal therapy with apalutamide, relugolix -Due to his acute myocardial injury temporarily stop his apalutamide and relugolix.  He is understandably concerned about stopping his medications for prostate cancer. -Please contact [facility] on-call re: cardiac issues and when to resume therapy for prostate cancer.  AKI stage I, background CKD stage II Baseline creatinine approximately 1.  Elevated to 1.3 on admission.  Suspect this is possibly cardiorenal in the setting of heart failure.  -Diuresis and blood products per above. -Check PVR, held oxybutynin for now   Chronic medical problems: Carotid artery disease: See management of acute myocardial injury above History of SVT: Continue home metoprolol 50 mg daily Hyperlipidemia: See statin above Mood disorder: Continue home paroxetine  Body mass index is 29.96 kg/m.    DVT prophylaxis:  SCDs Code Status:  Full Code Diet:  Diet Orders (From admission, onward)     Start     Ordered   01/08/23 0520  Diet NPO time specified Except for: Sips with Meds, Ice Chips  Diet effective now       Question Answer Comment  Except for Sips with Meds   Except for Ice Chips      01/08/23 0525           Family Communication:  No   Consults:  GI, cardiology   Admission status:   Inpatient, Step Down Unit  Severity of Illness: The appropriate patient status for this patient is INPATIENT. Inpatient status is judged to be reasonable and necessary in order to provide the required intensity of service to ensure the patient's safety. The patient's presenting symptoms, physical exam findings, and initial radiographic and laboratory data in the context of their chronic comorbidities is felt to place them at high risk for further  clinical deterioration. Furthermore, it is not anticipated that the patient will be medically stable for discharge from the hospital within 2 midnights  of admission.   * I certify that at the point of admission it is my clinical judgment that the patient will require inpatient hospital care spanning beyond 2 midnights from the point of admission due to high intensity of service, high risk for further deterioration and high frequency of surveillance required.*   Dolly Rias, MD [facility]  How to contact the [facility] Attending or Consulting provider 7A - 7P or covering provider during after hours 7P -7A, for this patient.  Check the care team in [facility] and look for a) attending/consulting [facility] provider listed and b) the [facility] team listed Log into [email or web address] and use [place]'s universal password to access. If you do not have the password, please contact the hospital operator. Locate the [facility] provider you are looking for under [facility] and page to a number that you can be directly reached. If you still have difficulty reaching the provider, please page the [facility] (Director on Call) for the Hospitalists listed on amion for assistance.  01/08/2023, 6:06 AM

## 2023-01-08 NOTE — ED Triage Notes (Signed)
The pt has shortness of breath especially when he walks then he has chest pain and he was unable to sleep

## 2023-01-08 NOTE — ED Notes (Signed)
ED TO INPATIENT HANDOFF REPORT  ED Nurse Name and Phone #: Morrie Sheldon RN [phone]  S Name/Age/Gender Logan Wolfe 77 y.o. male Room/Bed: 043C/043C  Code Status   Code Status: Full Code  Home/SNF/Other Home Patient oriented to: self, place, time, and situation Is this baseline? Yes   Triage Complete: Triage complete  Chief Complaint Lower GI bleed [K92.2]  Triage Note The pt has shortness of breath especially when he walks then he has chest pain and he was unable to sleep   Allergies Allergies  Allergen Reactions   Doxycycline     burning on tongue   Neosporin [Bacitracin-Polymyxin B] Other (See Comments)    Redness   Penicillins Rash    Has patient had a PCN reaction causing immediate rash, facial/tongue/throat swelling, SOB or lightheadedness with hypotension: No Has patient had a PCN reaction causing severe rash involving mucus membranes or skin necrosis: No Has patient had a PCN reaction that required hospitalization: No Has patient had a PCN reaction occurring within the last 10 years: No If all of the above answers are "NO", then may proceed with Cephalosporin use.    Level of Care/Admitting Diagnosis ED Disposition     ED Disposition  Admit   Condition  --   Comment  Hospital [facility] [[id number]] Level of Care: Progressive [102] Admit to Progressive based on following criteria: CARDIOVASCULAR & THORACIC of moderate stability with acute coronary syndrome symptoms/low risk myocardial infarc tion/hypertensive urgency/arrhythmias/heart failure potentially compromising stability and stable post cardiovascular intervention patients. May admit patient to [facility] or [facility] if equivalent level of care is available:: No Covid Evaluati on: Asymptomatic - no recent exposure (last 10 days) testing not required Diagnosis: Lower GI bleed [454098] Admitting Physician: Dolly Rias [[id number]] Attending Physician: Dolly Rias  [[id number]] Certification:: I certify this patient wi ll need inpatient services for at least 2 midnights Expected Medical Readiness: 01/11/2023          B Medical/Surgery History Past Medical History:  Diagnosis Date   Allergic rhinitis, cause unspecified    Anxiety    Aortic valve calcification    Cancer (HCC)    Prostate CA, Dr Annabell Howells 22 years ago   Constipation    Depressive disorder    Dyspepsia and other specified disorders of function of stomach    GERD (gastroesophageal reflux disease)    Hypoglycemia    Memory loss    Otitis    externa of right ear   Prostate cancer (HCC)    Spinal stenosis    Unspecified pruritic disorder    Past Surgical History:  Procedure Laterality Date   PROSTATE SURGERY       A IV Location/Drains/Wounds Patient Lines/Drains/Airways Status     Active Line/Drains/Airways     Name Placement date Placement time Site Days   Peripheral IV 01/08/23 20 G Right Antecubital 01/08/23  0314  Antecubital  less than 1   Peripheral IV 01/08/23 20 G 1.25" Anterior;Left Forearm 01/08/23  1504  Forearm  less than 1            Intake/Output Last 24 hours  Intake/Output Summary (Last 24 hours) at 01/08/2023 1559 Last data filed at 01/08/2023 0908 Gross per 24 hour  Intake 319.83 ml  Output --  Net 319.83 ml    Labs/Imaging Results for orders placed or performed during the hospital encounter of 01/08/23 (from the past 48 hour(s))  Brain natriuretic peptide (order if patient c/o SOB ONLY)  Status: Abnormal   Collection Time: 01/08/23  2:20 AM  Result Value Ref Range   B Natriuretic Peptide 610.0 (H) 0.0 - 100.0 pg/mL    Comment: Performed at [facility], [address], [place], [place] [address]  CBG monitoring, ED     Status: None   Collection Time: 01/08/23  2:23 AM  Result Value Ref Range   Glucose-Capillary 95 70 - 99 mg/dL    Comment: Glucose reference range applies only to samples taken after fasting for at least 8  hours.  Basic metabolic panel     Status: Abnormal   Collection Time: 01/08/23  2:24 AM  Result Value Ref Range   Sodium 138 135 - 145 mmol/L   Potassium 4.1 3.5 - 5.1 mmol/L   Chloride 109 98 - 111 mmol/L   CO2 20 (L) 22 - 32 mmol/L   Glucose, Bld 103 (H) 70 - 99 mg/dL    Comment: Glucose reference range applies only to samples taken after fasting for at least 8 hours.   BUN 21 8 - 23 mg/dL   Creatinine, Ser 7.25 (H) 0.61 - 1.24 mg/dL   Calcium 9.0 8.9 - 36.6 mg/dL   GFR, Estimated 55 (L) >60 mL/min    Comment: (NOTE) Calculated using the CKD-EPI Creatinine Equation (2021)    Anion gap 9 5 - 15    Comment: Performed at [facility], [address] [place], [place] [address]  Troponin I (High Sensitivity)     Status: Abnormal   Collection Time: 01/08/23  2:24 AM  Result Value Ref Range   Troponin I (High Sensitivity) 310 (HH) <18 ng/L    Comment: CRITICAL RESULT CALLED TO, READ BACK BY AND VERIFIED WITH S. GASQUE RN 01/08/23 @0323  BY J. WHITE (NOTE) Elevated high sensitivity troponin I (hsTnI) values and significant  changes across serial measurements may suggest ACS but many other  chronic and acute conditions are known to elevate hsTnI results.  Refer to the "Links" section for chest pain algorithms and additional  guidance. Performed at [facility], [address], [place], [place] [address]   CBC     Status: Abnormal   Collection Time: 01/08/23  2:24 AM  Result Value Ref Range   WBC 4.6 4.0 - 10.5 K/uL   RBC 2.28 (L) 4.22 - 5.81 MIL/uL   Hemoglobin 7.8 (L) 13.0 - 17.0 g/dL   HCT 56.3 (L) 87.5 - 64.3 %   MCV 103.1 (H) 80.0 - 100.0 fL   MCH 34.2 (H) 26.0 - 34.0 pg   MCHC 33.2 30.0 - 36.0 g/dL   RDW 32.9 51.8 - 84.1 %   Platelets 222 150 - 400 K/uL   nRBC 0.0 0.0 - 0.2 %    Comment: Performed at [facility], [address], [place], [place] [address]  Lipid panel     Status: None   Collection Time: 01/08/23  2:24 AM  Result Value Ref Range    Cholesterol 147 0 - 200 mg/dL   Triglycerides 51 <016 mg/dL   HDL 72 >01 mg/dL   Total CHOL/HDL Ratio 2.0 RATIO   VLDL 10 0 - 40 mg/dL   LDL Cholesterol 65 0 - 99 mg/dL    Comment:        Total Cholesterol/HDL:CHD Risk Coronary Heart Disease Risk Table                     Men   Women  1/2 Average Risk   3.4   3.3  Average Risk       5.0   4.4  2 X Average Risk   9.6   7.1  3 X Average Risk  23.4   11.0        Use the calculated Patient Ratio above and the CHD Risk Table to determine the patient's CHD Risk.        ATP III CLASSIFICATION (LDL):  <100     mg/dL   Optimal  027-253  mg/dL   Near or Above                    Optimal  130-159  mg/dL   Borderline  664-403  mg/dL   High  >474     mg/dL   Very High Performed at [facility], [address] [place], [place] [address]   Hemoglobin A1c     Status: None   Collection Time: 01/08/23  2:24 AM  Result Value Ref Range   Hgb A1c MFr Bld 4.9 4.8 - 5.6 %    Comment: (NOTE) Pre diabetes:          5.7%-6.4%  Diabetes:              >6.4%  Glycemic control for   <7.0% adults with diabetes    Mean Plasma Glucose 93.93 mg/dL    Comment: Performed at [facility], [address] [place], [place] [address]  Iron and TIBC     Status: Abnormal   Collection Time: 01/08/23  2:45 AM  Result Value Ref Range   Iron 55 45 - 182 ug/dL   TIBC 433 295 - 188 ug/dL   Saturation Ratios 15 (L) 17.9 - 39.5 %   UIBC 322 ug/dL    Comment: Performed at [facility], [address], [place], [place] [address]  Transferrin     Status: None   Collection Time: 01/08/23  2:45 AM  Result Value Ref Range   Transferrin 269 180 - 329 mg/dL    Comment: Performed at [facility], [address], [place], [place] [address]  Hepatic function panel     Status: Abnormal   Collection Time: 01/08/23  2:45 AM  Result Value Ref Range   Total Protein 6.3 (L) 6.5 - 8.1 g/dL   Albumin 3.6 3.5 - 5.0 g/dL   AST 22 15 - 41 U/L   ALT 14 0  - 44 U/L   Alkaline Phosphatase 50 38 - 126 U/L   Total Bilirubin 0.3 <1.2 mg/dL   Bilirubin, Direct <0.1 0.0 - 0.2 mg/dL   Indirect Bilirubin NOT CALCULATED 0.3 - 0.9 mg/dL    Comment: Performed at [facility], [address] [place], [place] [address]  POC occult blood, ED     Status: Abnormal   Collection Time: 01/08/23  3:03 AM  Result Value Ref Range   Fecal Occult Bld POSITIVE (A) NEGATIVE  Type and screen [facility]     Status: None (Preliminary result)   Collection Time: 01/08/23  3:05 AM  Result Value Ref Range   ABO/RH(D) A POS    Antibody Screen NEG    Sample Expiration 01/11/2023,2359    Unit Number [id number]    Blood Component Type RED CELLS,LR    Unit division 00    Status of Unit ISSUED    Transfusion Status OK TO TRANSFUSE  Crossmatch Result      Compatible Performed at [facility], [address], [place], [place] [address]   ABO/Rh     Status: None   Collection Time: 01/08/23  3:07 AM  Result Value Ref Range   ABO/RH(D)      A POS Performed at [facility], [address], [place], [place] [address]   TSH     Status: None   Collection Time: 01/08/23  3:10 AM  Result Value Ref Range   TSH 3.841 0.350 - 4.500 uIU/mL    Comment: Performed by a 3rd Generation assay with a functional sensitivity of <=0.01 uIU/mL. Performed at [facility], [address] [place], [place] [address]   Folate     Status: None   Collection Time: 01/08/23  3:10 AM  Result Value Ref Range   Folate 8.5 >5.9 ng/mL    Comment: Performed at [facility], [address], [place], [place] [address]  Vitamin B12     Status: Abnormal   Collection Time: 01/08/23  3:10 AM  Result Value Ref Range   Vitamin B-12 129 (L) 180 - 914 pg/mL    Comment: (NOTE) This assay is not validated for testing neonatal or myeloproliferative syndrome specimens for Vitamin B12 levels. Performed at [facility], [address]  [place], [place] [address]   Troponin I (High Sensitivity)     Status: Abnormal   Collection Time: 01/08/23  4:36 AM  Result Value Ref Range   Troponin I (High Sensitivity) 1,025 (HH) <18 ng/L    Comment: CRITICAL VALUE NOTED. VALUE IS CONSISTENT WITH PREVIOUSLY REPORTED/CALLED VALUE (NOTE) Elevated high sensitivity troponin I (hsTnI) values and significant  changes across serial measurements may suggest ACS but many other  chronic and acute conditions are known to elevate hsTnI results.  Refer to the "Links" section for chest pain algorithms and additional  guidance. Performed at [facility], [address] [place], [place] [address]   Prepare RBC (crossmatch)     Status: None   Collection Time: 01/08/23  5:22 AM  Result Value Ref Range   Order Confirmation      ORDER PROCESSED BY BLOOD BANK Performed at [facility], [address], [place], [place] [address]   Lactate dehydrogenase     Status: None   Collection Time: 01/08/23  8:50 AM  Result Value Ref Range   LDH 192 98 - 192 U/L    Comment: Performed at [facility], [address], [place], [place] [address]  Troponin I (High Sensitivity)     Status: Abnormal   Collection Time: 01/08/23  8:50 AM  Result Value Ref Range   Troponin I (High Sensitivity) 1,919 (HH) <18 ng/L    Comment: CRITICAL VALUE NOTED. VALUE IS CONSISTENT WITH PREVIOUSLY REPORTED/CALLED VALUE (NOTE) Elevated high sensitivity troponin I (hsTnI) values and significant  changes across serial measurements may suggest ACS but many other  chronic and acute conditions are known to elevate hsTnI results.  Refer to the "Links" section for chest pain algorithms and additional  guidance. Performed at [facility], [address] [place], [place] [address]   Hemoglobin and hematocrit, blood     Status: Abnormal   Collection Time: 01/08/23 11:25 AM  Result Value Ref Range   Hemoglobin 8.7 (L) 13.0 - 17.0 g/dL   HCT 33.2 (L) 95.1 - 88.4 %     Comment: Performed  at [facility], [address] [place], [place] [address]  Troponin I (High Sensitivity)     Status: Abnormal   Collection Time: 01/08/23 11:25 AM  Result Value Ref Range   Troponin I (High Sensitivity) 1,921 (HH) <18 ng/L    Comment: CRITICAL VALUE NOTED. VALUE IS CONSISTENT WITH PREVIOUSLY REPORTED/CALLED VALUE (NOTE) Elevated high sensitivity troponin I (hsTnI) values and significant  changes across serial measurements may suggest ACS but many other  chronic and acute conditions are known to elevate hsTnI results.  Refer to the "Links" section for chest pain algorithms and additional  guidance. Performed at [facility], [address], [place], [place] [address]   Troponin I (High Sensitivity)     Status: Abnormal   Collection Time: 01/08/23  1:29 PM  Result Value Ref Range   Troponin I (High Sensitivity) 1,840 (HH) <18 ng/L    Comment: CRITICAL VALUE NOTED. VALUE IS CONSISTENT WITH PREVIOUSLY REPORTED/CALLED VALUE (NOTE) Elevated high sensitivity troponin I (hsTnI) values and significant  changes across serial measurements may suggest ACS but many other  chronic and acute conditions are known to elevate hsTnI results.  Refer to the "Links" section for chest pain algorithms and additional  guidance. Performed at [facility], [address], [place], [place] [address]    ECHOCARDIOGRAM COMPLETE  Result Date: 01/08/2023    ECHOCARDIOGRAM REPORT   Patient Name:   DARVEL DANCEL Date of Exam: 01/08/2023 Medical Rec #:  [id number]      Height:       73.0 in Accession #:    [id number]     Weight:       227.1 lb Date of Birth:  [birth year]      BSA:          2.271 m Patient Age:    77 years       BP:           120/70 mmHg Patient Gender: M              HR:           56 bpm. Exam Location:  Inpatient Procedure: 2D Echo, Color Doppler and Cardiac Doppler Indications:    Aortic stenosis I35.0  History:        Patient has prior history of  Echocardiogram examinations, most                 recent 03/05/2022. CHF, Aortic Valve Disease; Risk                 Factors:Diabetes, Dyslipidemia and Current Smoker.  Sonographer:    Dondra Prader RVT RCS Referring Phys: [id number] JONATHAN SEGARS IMPRESSIONS  1. Left ventricular ejection fraction, by estimation, is 60 to 65%. The left ventricle has normal function. The left ventricle has no regional wall motion abnormalities. Left ventricular diastolic parameters are consistent with Grade II diastolic dysfunction (pseudonormalization).  2. Right ventricular systolic function is normal. The right ventricular size is normal. There is moderately elevated pulmonary artery systolic pressure. The estimated right ventricular systolic pressure is 56.0 mmHg.  3. Left atrial size was mildly dilated.  4. The mitral valve is degenerative. Mild mitral valve regurgitation. No evidence of mitral stenosis. Moderate mitral annular calcification.  5. The aortic valve is tricuspid. There is severe calcifcation of the aortic valve. Aortic valve regurgitation is mild to moderate. 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 Aortic valve area, by VTI measures 0.95 cm.  Aortic valve  mean gradient measures 29.0 mmHg.  6. The inferior vena cava is dilated in size with <50% respiratory variability, suggesting right atrial pressure of 15 mmHg. FINDINGS  Left Ventricle: Left ventricular ejection fraction, by estimation, is 60 to 65%. The left ventricle has normal function. The left ventricle has no regional wall motion abnormalities. The left ventricular internal cavity size was normal in size. There is  no left ventricular hypertrophy. Left ventricular diastolic parameters are consistent with Grade II diastolic dysfunction (pseudonormalization). Right Ventricle: The right ventricular size is normal. No increase in right ventricular wall thickness. Right ventricular systolic function is normal. There is moderately elevated  pulmonary artery systolic pressure. The tricuspid regurgitant velocity is 3.20 m/s, and with an assumed right atrial pressure of 15 mmHg, the estimated right ventricular systolic pressure is 56.0 mmHg. Left Atrium: Left atrial size was mildly dilated. Right Atrium: Right atrial size was normal in size. Pericardium: There is no evidence of pericardial effusion. Mitral Valve: The mitral valve is degenerative in appearance. There is moderate calcification of the mitral valve leaflet(s). Moderate mitral annular calcification. Mild mitral valve regurgitation. No evidence of mitral valve stenosis. Tricuspid Valve: The tricuspid valve is normal in structure. Tricuspid valve regurgitation is trivial. Aortic Valve: The aortic valve is tricuspid. There is severe calcifcation of the aortic valve. Aortic valve regurgitation is mild to moderate. Aortic regurgitation PHT measures 638 msec. Severe aortic stenosis is present. Aortic valve mean gradient measures 29.0 mmHg. Aortic valve peak gradient measures 43.6 mmHg. Aortic valve area, by VTI measures 0.95 cm. Pulmonic Valve: The pulmonic valve was normal in structure. Pulmonic valve regurgitation is not visualized. Aorta: The aortic root is normal in size and structure. Venous: The inferior vena cava is dilated in size with less than 50% respiratory variability, suggesting right atrial pressure of 15 mmHg. IAS/Shunts: No atrial level shunt detected by color flow Doppler.  LEFT VENTRICLE PLAX 2D LVIDd:         5.10 cm   Diastology LVIDs:         2.90 cm   LV e' medial:    5.57 cm/s LV PW:         1.30 cm   LV E/e' medial:  12.3 LV IVS:        1.00 cm   LV e' lateral:   7.97 cm/s LVOT diam:     1.70 cm   LV E/e' lateral: 8.6 LV SV:         73 LV SV Index:   32 LVOT Area:     2.27 cm  RIGHT VENTRICLE             IVC RV Basal diam:  3.70 cm     IVC diam: 2.10 cm RV S prime:     11.40 cm/s TAPSE (M-mode): 3.2 cm LEFT ATRIUM             Index        RIGHT ATRIUM           Index LA  diam:        4.20 cm 1.85 cm/m   RA Area:     15.07 cm LA Vol (A2C):   60.4 ml 26.58 ml/m  RA Volume:   38.40 ml  16.91 ml/m LA Vol (A4C):   73.7 ml 32.45 ml/m LA Biplane Vol: 73.5 ml 32.37 ml/m  AORTIC VALVE  PULMONIC VALVE AV Area (Vmax):    0.94 cm      PV Vmax:       0.83 m/s AV Area (Vmean):   0.97 cm      PV Peak grad:  2.8 mmHg AV Area (VTI):     0.95 cm AV Vmax:           330.00 cm/s AV Vmean:          226.667 cm/s AV VTI:            0.765 m AV Peak Grad:      43.6 mmHg AV Mean Grad:      29.0 mmHg LVOT Vmax:         137.00 cm/s LVOT Vmean:        97.300 cm/s LVOT VTI:          0.320 m LVOT/AV VTI ratio: 0.42 AI PHT:            638 msec AR Vena Contracta: 0.40 cm  AORTA Ao Root diam: 2.80 cm Ao Asc diam:  3.40 cm MITRAL VALVE               TRICUSPID VALVE MV Area (PHT): 3.91 cm    TR Peak grad:   41.0 mmHg MV Decel Time: 194 msec    TR Vmax:        320.00 cm/s MV E velocity: 68.30 cm/s MV A velocity: 66.90 cm/s  SHUNTS MV E/A ratio:  1.02        Systemic VTI:  0.32 m                            Systemic Diam: 1.70 cm Dalton McleanMD Electronically signed by Wilfred Lacy Signature Date/Time: 01/08/2023/1:09:49 PM    Final    DG Chest Portable 1 View  Result Date: 01/08/2023 CLINICAL DATA:  Shortness of breath and chest pain EXAM: PORTABLE CHEST 1 VIEW COMPARISON:  Radiograph 03/04/2022 FINDINGS: Stable cardiomediastinal silhouette. Aortic atherosclerotic calcification. Diffuse interstitial coarsening greatest in the mid and lower lungs is new since 03/04/2022. No focal consolidation, pleural effusion, or pneumothorax. No displaced rib fractures. IMPRESSION: Diffuse interstitial coarsening may be due to atypical infection or edema. Electronically Signed   By: Minerva Fester M.D.   On: 01/08/2023 03:17    Pending Labs Unresulted Labs (From admission, onward)     Start     Ordered   01/09/23 0500  Basic metabolic panel  Tomorrow morning,   R        01/08/23 0525    01/09/23 0500  CBC  Tomorrow morning,   R        01/08/23 0525   01/09/23 0500  Magnesium  Tomorrow morning,   R        01/08/23 0525   01/09/23 0500  Phosphorus  Tomorrow morning,   R        01/08/23 0525   01/09/23 0500  Protime-INR  Tomorrow morning,   R        01/08/23 0525   01/08/23 1100  Hemoglobin and hematocrit, blood  Now then every 8 hours,   R (with TIMED occurrences)      01/08/23 1026   01/08/23 0525  Haptoglobin  Add-on,   AD        01/08/23 0525            Vitals/Pain Today's Vitals   01/08/23 1454 01/08/23 1515 01/08/23 1530  01/08/23 1537  BP:  127/84 (!) 144/68   Pulse:  66 68   Resp:  19 20   Temp: 97.6 F (36.4 C)  98.5 F (36.9 C)   TempSrc: Oral  Oral   SpO2:  96% 99%   Weight:      Height:      PainSc:    0-No pain    Isolation Precautions No active isolations  Medications Medications  sodium chloride flush (NS) 0.9 % injection 3 mL (3 mLs Intravenous Given 01/08/23 1011)  acetaminophen (TYLENOL) tablet 1,000 mg (has no administration in time range)  metoprolol succinate (TOPROL-XL) 24 hr tablet 50 mg (50 mg Oral Given 01/08/23 1009)  rosuvastatin (CRESTOR) tablet 20 mg (20 mg Oral Given 01/08/23 0718)  PARoxetine (PAXIL) tablet 20 mg (20 mg Oral Given 01/08/23 1009)  budesonide (ENTOCORT EC) 24 hr capsule 9 mg (9 mg Oral Given 01/08/23 1009)  cyanocobalamin (VITAMIN B12) tablet 1,000 mcg (1,000 mcg Oral Given 01/08/23 1141)  apalutamide (ERLEADA) tablet 240 mg (has no administration in time range)  relugolix (ORGOVYX) tablet 120 mg (has no administration in time range)  furosemide (LASIX) injection 20 mg (20 mg Intravenous Given 01/08/23 0718)  0.9 %  sodium chloride infusion (Manually program via Guardrails IV Fluids) (0 mLs Intravenous Stopped 01/08/23 0908)    Mobility walks with device     Focused Assessments Pulmonary Assessment Handoff:  Lung sounds: Bilateral Breath Sounds: Diminished, Clear O2 Device: Room Air       R Recommendations: See Admitting Provider Note  Report given to:   Additional Notes:

## 2023-01-08 NOTE — ED Notes (Signed)
IP provider at bedside

## 2023-01-08 NOTE — Consult Note (Signed)
Reason for Consult: Bright red blood per rectum symptomatic anemia Referring Physician: Hospital team  Logan Wolfe is an 77 y.o. male.  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 and periodically gets on Entocort which does help his diarrhea when it occurs rather quickly and he had just restarted on that a week or 2 ago in controlling his diarrhea has not made a difference with his bleeding and he has no other GI complaints and we discussed his previous radiation therapy but no GI problems run in the family and he has no other complaints and specifically his 2 previous colonoscopies were reviewed to include the last 1 being in 2016  Past Medical History:  Diagnosis Date   Allergic rhinitis, cause unspecified    Anxiety    Aortic valve calcification    Cancer (HCC)    Prostate CA, Dr Annabell Howells 22 years ago   Constipation    Depressive disorder    Dyspepsia and other specified disorders of function of stomach    GERD (gastroesophageal reflux disease)    Hypoglycemia    Memory loss    Otitis    externa of right ear   Prostate cancer (HCC)    Spinal stenosis    Unspecified pruritic disorder     Past Surgical History:  Procedure Laterality Date   PROSTATE SURGERY      Family History  Problem Relation Age of Onset   Hyperlipidemia Mother    Diabetes Mother    Skin cancer Father        part of right ear removed   Prostate cancer Brother    Breast cancer Neg Hx    Colon cancer Neg Hx    Pancreatic cancer Neg Hx     Social History:  reports that  he has been smoking cigars. He has never used smokeless tobacco. He reports current alcohol use of about 1.0 standard drink of alcohol per week. He reports that he does not use drugs.  Allergies:  Allergies  Allergen Reactions   Doxycycline     burning on tongue   Neosporin [Bacitracin-Polymyxin B] Other (See Comments)    Redness   Penicillins Rash    Has patient had a PCN reaction causing immediate rash, facial/tongue/throat swelling, SOB or lightheadedness with hypotension: No Has patient had a PCN reaction causing severe rash involving mucus membranes or skin necrosis: No Has patient had a PCN reaction that required hospitalization: No Has patient had a PCN reaction occurring within the last 10 years: No If all of the above answers are "NO", then may proceed with Cephalosporin use.    Medications: I have reviewed the patient's current medications.  Results for orders placed or performed during the hospital encounter of 01/08/23 (from the past 48 hour(s))  Brain natriuretic peptide (order if patient c/o SOB ONLY)     Status: Abnormal   Collection Time: 01/08/23  2:20 AM  Result Value Ref Range   B Natriuretic Peptide 610.0 (H) 0.0 - 100.0 pg/mL    Comment: Performed  at [facility], [address], [place], [place] [address]  CBG monitoring, ED     Status: None   Collection Time: 01/08/23  2:23 AM  Result Value Ref Range   Glucose-Capillary 95 70 - 99 mg/dL    Comment: Glucose reference range applies only to samples taken after fasting for at least 8 hours.  Basic metabolic panel     Status: Abnormal   Collection Time: 01/08/23  2:24 AM  Result Value Ref Range   Sodium 138 135 - 145 mmol/L   Potassium 4.1 3.5 - 5.1 mmol/L   Chloride 109 98 - 111 mmol/L   CO2 20 (L) 22 - 32 mmol/L   Glucose, Bld 103 (H) 70 - 99 mg/dL    Comment: Glucose reference range applies only to samples taken after fasting for at least 8 hours.   BUN 21 8 - 23 mg/dL   Creatinine, Ser 1.30 (H) 0.61  - 1.24 mg/dL   Calcium 9.0 8.9 - 86.5 mg/dL   GFR, Estimated 55 (L) >60 mL/min    Comment: (NOTE) Calculated using the CKD-EPI Creatinine Equation (2021)    Anion gap 9 5 - 15    Comment: Performed at [facility], [address], [place], [place] [address]  Troponin I (High Sensitivity)     Status: Abnormal   Collection Time: 01/08/23  2:24 AM  Result Value Ref Range   Troponin I (High Sensitivity) 310 (HH) <18 ng/L    Comment: CRITICAL RESULT CALLED TO, READ BACK BY AND VERIFIED WITH S. GASQUE RN 01/08/23 @0323  BY J. WHITE (NOTE) Elevated high sensitivity troponin I (hsTnI) values and significant  changes across serial measurements may suggest ACS but many other  chronic and acute conditions are known to elevate hsTnI results.  Refer to the "Links" section for chest pain algorithms and additional  guidance. Performed at [facility], [address] [place], [place] [address]   CBC     Status: Abnormal   Collection Time: 01/08/23  2:24 AM  Result Value Ref Range   WBC 4.6 4.0 - 10.5 K/uL   RBC 2.28 (L) 4.22 - 5.81 MIL/uL   Hemoglobin 7.8 (L) 13.0 - 17.0 g/dL   HCT 84.1 (L) 32.4 - 40.1 %   MCV 103.1 (H) 80.0 - 100.0 fL   MCH 34.2 (H) 26.0 - 34.0 pg   MCHC 33.2 30.0 - 36.0 g/dL   RDW 02.7 25.3 - 66.4 %   Platelets 222 150 - 400 K/uL   nRBC 0.0 0.0 - 0.2 %    Comment: Performed at [facility], [address] [place], [place] [address]  Lipid panel     Status: None   Collection Time: 01/08/23  2:24 AM  Result Value Ref Range   Cholesterol 147 0 - 200 mg/dL   Triglycerides 51 <425 mg/dL   HDL 72 >95 mg/dL   Total CHOL/HDL Ratio 2.0 RATIO   VLDL 10 0 - 40 mg/dL   LDL Cholesterol 65 0 - 99 mg/dL    Comment:        Total Cholesterol/HDL:CHD Risk Coronary Heart Disease Risk Table                     Men   Women  1/2 Average Risk   3.4   3.3  Average Risk       5.0   4.4  2 X Average Risk   9.6  7.1  3 X Average Risk  23.4   11.0        Use the  calculated Patient Ratio above and the CHD Risk Table to determine the patient's CHD Risk.        ATP III CLASSIFICATION (LDL):  <100     mg/dL   Optimal  161-096  mg/dL   Near or Above                    Optimal  130-159  mg/dL   Borderline  045-409  mg/dL   High  >811     mg/dL   Very High Performed at [facility], [address] [place], [place] [address]   Hemoglobin A1c     Status: None   Collection Time: 01/08/23  2:24 AM  Result Value Ref Range   Hgb A1c MFr Bld 4.9 4.8 - 5.6 %    Comment: (NOTE) Pre diabetes:          5.7%-6.4%  Diabetes:              >6.4%  Glycemic control for   <7.0% adults with diabetes    Mean Plasma Glucose 93.93 mg/dL    Comment: Performed at [facility], [address] [place], [place] [address]  Iron and TIBC     Status: Abnormal   Collection Time: 01/08/23  2:45 AM  Result Value Ref Range   Iron 55 45 - 182 ug/dL   TIBC 130 865 - 784 ug/dL   Saturation Ratios 15 (L) 17.9 - 39.5 %   UIBC 322 ug/dL    Comment: Performed at [facility], [address], [place], [place] [address]  Transferrin     Status: None   Collection Time: 01/08/23  2:45 AM  Result Value Ref Range   Transferrin 269 180 - 329 mg/dL    Comment: Performed at [facility], [address], [place], [place] [address]  Hepatic function panel     Status: Abnormal   Collection Time: 01/08/23  2:45 AM  Result Value Ref Range   Total Protein 6.3 (L) 6.5 - 8.1 g/dL   Albumin 3.6 3.5 - 5.0 g/dL   AST 22 15 - 41 U/L   ALT 14 0 - 44 U/L   Alkaline Phosphatase 50 38 - 126 U/L   Total Bilirubin 0.3 <1.2 mg/dL   Bilirubin, Direct <3.2 0.0 - 0.2 mg/dL   Indirect Bilirubin NOT CALCULATED 0.3 - 0.9 mg/dL    Comment: Performed at [facility], [address], [place], [place] [address]  POC occult blood, ED     Status: Abnormal   Collection Time: 01/08/23  3:03 AM  Result Value Ref Range   Fecal Occult Bld POSITIVE (A) NEGATIVE  Type and screen  [facility]     Status: None (Preliminary result)   Collection Time: 01/08/23  3:05 AM  Result Value Ref Range   ABO/RH(D) A POS    Antibody Screen NEG    Sample Expiration 01/11/2023,2359    Unit Number [id number]    Blood Component Type RED CELLS,LR    Unit division 00    Status of Unit ISSUED    Transfusion Status OK TO TRANSFUSE    Crossmatch Result      Compatible Performed at [facility], [address] [place], [place] [address]   ABO/Rh     Status: None  Collection Time: 01/08/23  3:07 AM  Result Value Ref Range   ABO/RH(D)      A POS Performed at [facility], [address] [place], [place] [address]   TSH     Status: None   Collection Time: 01/08/23  3:10 AM  Result Value Ref Range   TSH 3.841 0.350 - 4.500 uIU/mL    Comment: Performed by a 3rd Generation assay with a functional sensitivity of <=0.01 uIU/mL. Performed at [facility], [address], [place], [place] [address]   Folate     Status: None   Collection Time: 01/08/23  3:10 AM  Result Value Ref Range   Folate 8.5 >5.9 ng/mL    Comment: Performed at [facility], [address] [place], [place] [address]  Vitamin B12     Status: Abnormal   Collection Time: 01/08/23  3:10 AM  Result Value Ref Range   Vitamin B-12 129 (L) 180 - 914 pg/mL    Comment: (NOTE) This assay is not validated for testing neonatal or myeloproliferative syndrome specimens for Vitamin B12 levels. Performed at [facility], [address] [place], [place] [address]   Troponin I (High Sensitivity)     Status: Abnormal   Collection Time: 01/08/23  4:36 AM  Result Value Ref Range   Troponin I (High Sensitivity) 1,025 (HH) <18 ng/L    Comment: CRITICAL VALUE NOTED. VALUE IS CONSISTENT WITH PREVIOUSLY REPORTED/CALLED VALUE (NOTE) Elevated high sensitivity troponin I (hsTnI) values and significant  changes across serial measurements may suggest ACS but many other  chronic and  acute conditions are known to elevate hsTnI results.  Refer to the "Links" section for chest pain algorithms and additional  guidance. Performed at [facility], [address] [place] [address], [place], [place] [address]   Prepare RBC (crossmatch)     Status: None   Collection Time: 01/08/23  5:22 AM  Result Value Ref Range   Order Confirmation      ORDER PROCESSED BY BLOOD BANK Performed at [facility], [address] [place], [place] [address]   Lactate dehydrogenase     Status: None   Collection Time: 01/08/23  8:50 AM  Result Value Ref Range   LDH 192 98 - 192 U/L    Comment: Performed at [facility], [address], [place], [place] [address]  Troponin I (High Sensitivity)     Status: Abnormal   Collection Time: 01/08/23  8:50 AM  Result Value Ref Range   Troponin I (High Sensitivity) 1,919 (HH) <18 ng/L    Comment: CRITICAL VALUE NOTED. VALUE IS CONSISTENT WITH PREVIOUSLY REPORTED/CALLED VALUE (NOTE) Elevated high sensitivity troponin I (hsTnI) values and significant  changes across serial measurements may suggest ACS but many other  chronic and acute conditions are known to elevate hsTnI results.  Refer to the "Links" section for chest pain algorithms and additional  guidance. Performed at [facility], [address] [place], [place] [address]   Hemoglobin and hematocrit, blood     Status: Abnormal   Collection Time: 01/08/23 11:25 AM  Result Value Ref Range   Hemoglobin 8.7 (L) 13.0 - 17.0 g/dL   HCT 32.2 (L) 02.5 - 42.7 %    Comment: Performed at [facility], [address], [place], [place] [address]  Troponin I (High Sensitivity)     Status: Abnormal   Collection Time: 01/08/23 11:25 AM  Result Value  Ref Range   Troponin I (High Sensitivity) 1,921 (HH) <18 ng/L    Comment: CRITICAL VALUE NOTED. VALUE IS CONSISTENT WITH PREVIOUSLY REPORTED/CALLED VALUE (NOTE) Elevated high sensitivity troponin I (hsTnI) values and significant  changes across  serial measurements may suggest ACS but many other  chronic and acute conditions are known to elevate hsTnI results.  Refer to the "Links" section for chest pain algorithms and additional  guidance. Performed at [facility], [address], [place], [place] [address]   Troponin I (High Sensitivity)     Status: Abnormal   Collection Time: 01/08/23  1:29 PM  Result Value Ref Range   Troponin I (High Sensitivity) 1,840 (HH) <18 ng/L    Comment: CRITICAL VALUE NOTED. VALUE IS CONSISTENT WITH PREVIOUSLY REPORTED/CALLED VALUE (NOTE) Elevated high sensitivity troponin I (hsTnI) values and significant  changes across serial measurements may suggest ACS but many other  chronic and acute conditions are known to elevate hsTnI results.  Refer to the "Links" section for chest pain algorithms and additional  guidance. Performed at [facility], [address], [place], [place] [address]     ECHOCARDIOGRAM COMPLETE  Result Date: 01/08/2023    ECHOCARDIOGRAM REPORT   Patient Name:   Logan Wolfe Date of Exam: 01/08/2023 Medical Rec #:  [id number]      Height:       73.0 in Accession #:    [id number]     Weight:       227.1 lb Date of Birth:  Apr 13, 1945      BSA:          2.271 m Patient Age:    77 years       BP:           120/70 mmHg Patient Gender: M              HR:           56 bpm. Exam Location:  Inpatient Procedure: 2D Echo, Color Doppler and Cardiac Doppler Indications:    Aortic stenosis I35.0  History:        Patient has prior history of Echocardiogram examinations, most                 recent 03/05/2022. CHF, Aortic Valve Disease; Risk                 Factors:Diabetes, Dyslipidemia and Current Smoker.  Sonographer:    Dondra Prader RVT RCS Referring Phys: [id number] JONATHAN SEGARS IMPRESSIONS  1. Left ventricular ejection fraction, by estimation, is 60 to 65%. The left ventricle has normal function. The left ventricle has no regional wall motion abnormalities. Left ventricular diastolic  parameters are consistent with Grade II diastolic dysfunction (pseudonormalization).  2. Right ventricular systolic function is normal. The right ventricular size is normal. There is moderately elevated pulmonary artery systolic pressure. The estimated right ventricular systolic pressure is 56.0 mmHg.  3. Left atrial size was mildly dilated.  4. The mitral valve is degenerative. Mild mitral valve regurgitation. No evidence of mitral stenosis. Moderate mitral annular calcification.  5. The aortic valve is tricuspid. There is severe calcifcation of the aortic valve. Aortic valve regurgitation is mild to moderate. 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 Aortic valve area, by VTI measures 0.95 cm. Aortic valve  mean gradient measures 29.0 mmHg.  6. The inferior vena cava is dilated in size with <50% respiratory variability, suggesting right atrial pressure of 15 mmHg. FINDINGS  Left Ventricle: Left ventricular ejection fraction, by estimation, is 60 to 65%. The left ventricle has normal function. The left ventricle has no regional wall motion abnormalities. The left ventricular internal cavity size was normal in size. There is  no left ventricular hypertrophy. Left ventricular diastolic parameters are consistent with Grade II diastolic dysfunction (pseudonormalization). Right Ventricle: The right ventricular size is normal. No increase in right ventricular wall thickness. Right ventricular systolic function is normal. There is moderately elevated pulmonary artery systolic pressure. The tricuspid regurgitant velocity is 3.20 m/s, and with an assumed right atrial pressure of 15 mmHg, the estimated right ventricular systolic pressure is 56.0 mmHg. Left Atrium: Left atrial size was mildly dilated. Right Atrium: Right atrial size was normal in size. Pericardium: There is no evidence of pericardial effusion. Mitral Valve: The mitral valve is degenerative in appearance. There is moderate calcification  of the mitral valve leaflet(s). Moderate mitral annular calcification. Mild mitral valve regurgitation. No evidence of mitral valve stenosis. Tricuspid Valve: The tricuspid valve is normal in structure. Tricuspid valve regurgitation is trivial. Aortic Valve: The aortic valve is tricuspid. There is severe calcifcation of the aortic valve. Aortic valve regurgitation is mild to moderate. Aortic regurgitation PHT measures 638 msec. Severe aortic stenosis is present. Aortic valve mean gradient measures 29.0 mmHg. Aortic valve peak gradient measures 43.6 mmHg. Aortic valve area, by VTI measures 0.95 cm. Pulmonic Valve: The pulmonic valve was normal in structure. Pulmonic valve regurgitation is not visualized. Aorta: The aortic root is normal in size and structure. Venous: The inferior vena cava is dilated in size with less than 50% respiratory variability, suggesting right atrial pressure of 15 mmHg. IAS/Shunts: No atrial level shunt detected by color flow Doppler.  LEFT VENTRICLE PLAX 2D LVIDd:         5.10 cm   Diastology LVIDs:         2.90 cm   LV e' medial:    5.57 cm/s LV PW:         1.30 cm   LV E/e' medial:  12.3 LV IVS:        1.00 cm   LV e' lateral:   7.97 cm/s LVOT diam:     1.70 cm   LV E/e' lateral: 8.6 LV SV:         73 LV SV Index:   32 LVOT Area:     2.27 cm  RIGHT VENTRICLE             IVC RV Basal diam:  3.70 cm     IVC diam: 2.10 cm RV S prime:     11.40 cm/s TAPSE (M-mode): 3.2 cm LEFT ATRIUM             Index        RIGHT ATRIUM           Index LA diam:        4.20 cm 1.85 cm/m   RA Area:     15.07 cm LA Vol (A2C):   60.4 ml 26.58 ml/m  RA Volume:   38.40 ml  16.91 ml/m LA Vol (A4C):   73.7 ml 32.45 ml/m LA Biplane Vol: 73.5 ml 32.37 ml/m  AORTIC VALVE                     PULMONIC VALVE AV Area (Vmax):    0.94 cm      PV Vmax:       0.83 m/s AV Area (Vmean):  0.97 cm      PV Peak grad:  2.8 mmHg AV Area (VTI):     0.95 cm AV Vmax:           330.00 cm/s AV Vmean:          226.667 cm/s AV  VTI:            0.765 m AV Peak Grad:      43.6 mmHg AV Mean Grad:      29.0 mmHg LVOT Vmax:         137.00 cm/s LVOT Vmean:        97.300 cm/s LVOT VTI:          0.320 m LVOT/AV VTI ratio: 0.42 AI PHT:            638 msec AR Vena Contracta: 0.40 cm  AORTA Ao Root diam: 2.80 cm Ao Asc diam:  3.40 cm MITRAL VALVE               TRICUSPID VALVE MV Area (PHT): 3.91 cm    TR Peak grad:   41.0 mmHg MV Decel Time: 194 msec    TR Vmax:        320.00 cm/s MV E velocity: 68.30 cm/s MV A velocity: 66.90 cm/s  SHUNTS MV E/A ratio:  1.02        Systemic VTI:  0.32 m                            Systemic Diam: 1.70 cm Dalton McleanMD Electronically signed by Wilfred Lacy Signature Date/Time: 01/08/2023/1:09:49 PM    Final    DG Chest Portable 1 View  Result Date: 01/08/2023 CLINICAL DATA:  Shortness of breath and chest pain EXAM: PORTABLE CHEST 1 VIEW COMPARISON:  Radiograph 03/04/2022 FINDINGS: Stable cardiomediastinal silhouette. Aortic atherosclerotic calcification. Diffuse interstitial coarsening greatest in the mid and lower lungs is new since 03/04/2022. No focal consolidation, pleural effusion, or pneumothorax. No displaced rib fractures. IMPRESSION: Diffuse interstitial coarsening may be due to atypical infection or edema. Electronically Signed   By: Minerva Fester M.D.   On: 01/08/2023 03:17    Review of Systems negative except above Blood pressure 138/68, pulse (!) 55, temperature 98.5 F (36.9 C), temperature source Oral, resp. rate 17, height 6\' 1"  (1.854 m), weight 103 kg, SpO2 96%. 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 in a patient with history of microscopic  colitis currently doing well from that standpoint Plan: Once cleared by cardiology we will proceed with colonoscopy and we will keep him on clear liquids for now and based on the holiday which he really wants to go home for all either proceed Wednesday if okay with cardiology by then or as an outpatient in the next week or 2 and will need to do it at the hospital in case APC or RFA is needed for his radiation proctitis if it is found and I will check on tomorrow and please let me know when it is okay from a cardiology standpoint to proceed  [facility] E 01/08/2023, 4:52 PM

## 2023-01-08 NOTE — Progress Notes (Signed)
*  PRELIMINARY RESULTS* Echocardiogram 2D Echocardiogram has been performed.  Logan Wolfe 01/08/2023, 1:03 PM

## 2023-01-08 NOTE — ED Notes (Signed)
Provider at bedside

## 2023-01-08 NOTE — Consult Note (Signed)
Cardiology Consultation   Patient ID: Logan Wolfe MRN: [id number]; DOB: April 24, 1945  Admit date: 01/08/2023 Date of Consult: 01/08/2023  PCP:  Logan Floro, MD   [facility] Providers Cardiologist:  Logan Muss, MD        Patient Profile:   ICHAEL Wolfe is a 77 y.o. male with a hx of metastatic prostate CA, moderate AS, HLD, CKD2, CAD, GERD who is being seen 01/08/2023 for the evaluation of shortness of breath at the request of Dolly Rias MD.  History of Present Illness:   Mr. Logan Wolfe primary complaint is bloody stools which he states he has been having since June. He is now severely anemia (Hgb 7.8). In additional, he has been having shortness of breath episodes that have worsening over the last several weeks. He has exertional shortness of breath with mild activity for which he has to sit and rest even walking from his house to the neighbor's house. He denies orthopnea, bendopnea, or PND, and has trace to 1+ LE edema. Last night he had some chest tightness while laying in bed and that prevented him from falling asleep.   Past Medical History:  Diagnosis Date   Allergic rhinitis, cause unspecified    Anxiety    Aortic valve calcification    Cancer (HCC)    Prostate CA, Dr Logan Wolfe 22 years ago   Constipation    Depressive disorder    Dyspepsia and other specified disorders of function of stomach    GERD (gastroesophageal reflux disease)    Hypoglycemia    Memory loss    Otitis    externa of right ear   Prostate cancer (HCC)    Spinal stenosis    Unspecified pruritic disorder     Past Surgical History:  Procedure Laterality Date   PROSTATE SURGERY       Home Medications:  Prior to Admission medications   Medication Sig Start Date End Date Taking? Authorizing Provider  apalutamide (ERLEADA) 60 MG tablet Take 240 mg by mouth daily.   Yes [provider]  budesonide (ENTOCORT EC) 3 MG 24 hr capsule Take 6-12 mg by mouth See  admin instructions. Take 3 CAPSULES BY MOUTH DAILY FOR 4 WEEKS THEN 2 CAPSULES BY MOUTH DAILY FOR 2 WEEKS THEN 1 CAPSULE FOR 2 WEEKS   Yes [provider]  fluticasone (FLONASE) 50 MCG/ACT nasal spray Place 2 sprays into both nostrils daily as needed for allergies.   Yes [provider]  iron polysaccharides (NIFEREX) 150 MG capsule Take 150 mg by mouth 2 (two) times daily. 01/03/23  Yes [provider]  Melatonin 10 MG SUBL Take 10 mg by mouth at bedtime.   Yes [provider]  metoprolol succinate (TOPROL-XL) 50 MG 24 hr tablet Take 1 tablet (50 mg total) by mouth daily. Take with or immediately following a meal. 03/17/22  Yes Logan Crafts, MD  ondansetron (ZOFRAN) 8 MG tablet Take 1 tablet (8 mg total) by mouth every 8 (eight) hours as needed for nausea or vomiting. 04/19/22  Yes Erven Colla, PA-C  oxybutynin (DITROPAN) 5 MG tablet Take 5 mg by mouth 2 (two) times daily as needed. 06/16/22  Yes [provider]  PARoxetine (PAXIL) 20 MG tablet Take 20 mg by mouth every morning. 11/23/22  Yes [provider]  polyethylene glycol (MIRALAX / GLYCOLAX) 17 g packet Take 17 g by mouth daily as needed for mild constipation. 12/31/15  Yes [provider]  relugolix (  ORGOVYX) 120 MG tablet Take 120 mg by mouth daily.   Yes [provider]  rosuvastatin (CRESTOR) 10 MG tablet Take 1 tablet (10 mg total) by mouth daily. 11/06/22  Yes Logan Crafts, MD  megestrol (MEGACE) 20 MG tablet Take 1-3 tablets (20-60 mg total) by mouth daily. Patient not taking: Reported on 01/08/2023 04/20/22   Logan Dys, MD    Inpatient Medications: Scheduled Meds:  aspirin  162 mg Oral Once   budesonide  9 mg Oral Daily   furosemide  20 mg Intravenous Once   metoprolol succinate  50 mg Oral Daily   PARoxetine  20 mg Oral q morning   rosuvastatin  20 mg Oral Daily   sodium chloride flush  3 mL Intravenous Q12H   Continuous  Infusions:  PRN Meds: acetaminophen  Allergies:    Allergies  Allergen Reactions   Doxycycline     burning on tongue   Neosporin [Bacitracin-Polymyxin B] Other (See Comments)    Redness   Penicillins Rash    Has patient had a PCN reaction causing immediate rash, facial/tongue/throat swelling, SOB or lightheadedness with hypotension: No Has patient had a PCN reaction causing severe rash involving mucus membranes or skin necrosis: No Has patient had a PCN reaction that required hospitalization: No Has patient had a PCN reaction occurring within the last 10 years: No If all of the above answers are "NO", then may proceed with Cephalosporin use.    Social History:   Social History   Socioeconomic History   Marital status: Married    Spouse name: Logan Wolfe   Number of children: 2   Years of education: 13   Highest education level: Not on file  Occupational History    Employer: RETIRED  Tobacco Use   Smoking status: Light Smoker    Types: Cigars   Smokeless tobacco: Never   Tobacco comments:    Smoke a cigar when I play golf  Vaping Use   Vaping status: Never Used  Substance and Sexual Activity   Alcohol use: Yes    Alcohol/week: 1.0 standard drink of alcohol    Types: 1 Shots of liquor per week    Comment: a jack and ginger daily at 5 pom   Drug use: No   Sexual activity: Not Currently    Partners: Female  Other Topics Concern   Not on file  Social History Narrative   Patient is married Logan Wolfe) and lives with his wife.   Patient has 2 children and his wife has 2 children.   Patient is semi-retired.   Patient has a college education.   Patient is right-handed.   Patient drinks one cup of coffee daily.   Social Determinants of Health   Financial Resource Strain: Not on file  Food Insecurity: Not on file  Transportation Needs: Not on file  Physical Activity: Not on file  Stress: Not on file  Social Connections: Not on file  Intimate Partner Violence: Not on file     Family History:   Family History  Problem Relation Age of Onset   Hyperlipidemia Mother    Diabetes Mother    Skin cancer Father        part of right ear removed   Prostate cancer Brother    Breast cancer Neg Hx    Colon cancer Neg Hx    Pancreatic cancer Neg Hx      ROS:  Please see the history of present illness.  All other ROS reviewed and  negative.     Physical Exam/Data:   Vitals:   01/08/23 0545 01/08/23 0600 01/08/23 0606 01/08/23 0700  BP: 112/61 123/64  135/64  Pulse: 65 64  69  Resp: 17 15  13   Temp:   98 F (36.7 C) 98 F (36.7 C)  TempSrc:   Oral Oral  SpO2: 94% 100%    Weight:      Height:       No intake or output data in the 24 hours ending 01/08/23 0712    01/08/2023    2:10 AM 11/06/2022    9:58 AM 03/17/2022    1:24 PM  Last 3 Weights  Weight (lbs) 227 lb 1.2 oz 227 lb 223 lb 3.2 oz  Weight (kg) 103 kg 102.967 kg 101.243 kg     Body mass index is 29.96 kg/m.  General:  Well nourished, well developed, in no acute distress HEENT: normal Neck: no JVD Vascular: No carotid bruits; Distal pulses 2+ bilaterally Cardiac:  normal S1, S2; RRR; no murmur Lungs:  clear to auscultation bilaterally, no wheezing, rhonchi or rales  Abd: soft, nontender, no hepatomegaly  Ext: no edema Musculoskeletal:  No deformities, BUE and BLE strength normal and equal Skin: warm and dry  Neuro:  CNs 2-12 intact, no focal abnormalities noted Psych:  Normal affect   EKG:  The EKG was personally reviewed and demonstrates: SR at 64bpm Telemetry:  Telemetry was personally reviewed  Relevant CV Studies:  TTE 03/05/22: 1. Left ventricular ejection fraction, by estimation, is 55 to 60%. The  left ventricle has normal function. The left ventricle has no regional  wall motion abnormalities. There is moderate left ventricular hypertrophy.  Left ventricular diastolic  parameters are consistent with Grade II diastolic dysfunction  (pseudonormalization).   2. Right  ventricular systolic function is normal. The right ventricular  size is normal.   3. Left atrial size was mildly dilated.   4. The mitral valve is degenerative. Mild mitral valve regurgitation. No  evidence of mitral stenosis. The mean mitral valve gradient is 2.0 mmHg.  Moderate mitral annular calcification.   5. The aortic valve is calcified. There is severe calcifcation of the  aortic valve. There is severe thickening of the aortic valve. Aortic valve  regurgitation is mild. Moderate aortic valve stenosis. Aortic  regurgitation PHT measures 758 msec. Aortic  valve area, by VTI measures 1.12 cm. Aortic valve mean gradient measures  22.0 mmHg. Aortic valve Vmax measures 2.78 m/s.   6. The inferior vena cava is dilated in size with <50% respiratory  variability, suggesting right atrial pressure of 15 mmHg.  Comparison(s): No significant change from prior study. Prior images  reviewed side by side.   Laboratory Data:  High Sensitivity Troponin:   Recent Labs  Lab 01/08/23 0224 01/08/23 0436  TROPONINIHS 310* 1,025*     Chemistry Recent Labs  Lab 01/08/23 0224  NA 138  K 4.1  CL 109  CO2 20*  GLUCOSE 103*  BUN 21  CREATININE 1.31*  CALCIUM 9.0  GFRNONAA 55*  ANIONGAP 9    No results for input(s): "PROT", "ALBUMIN", "AST", "ALT", "ALKPHOS", "BILITOT" in the last 168 hours. Lipids No results for input(s): "CHOL", "TRIG", "HDL", "LABVLDL", "LDLCALC", "CHOLHDL" in the last 168 hours.  Hematology Recent Labs  Lab 01/08/23 0224  WBC 4.6  RBC 2.28*  HGB 7.8*  HCT 23.5*  MCV 103.1*  MCH 34.2*  MCHC 33.2  RDW 13.5  PLT 222   Thyroid  Recent  Labs  Lab 01/08/23 0310  TSH 3.841    BNP Recent Labs  Lab 01/08/23 0220  BNP 610.0*    DDimer No results for input(s): "DDIMER" in the last 168 hours.   Radiology/Studies:  DG Chest Portable 1 View  Result Date: 01/08/2023 CLINICAL DATA:  Shortness of breath and chest pain EXAM: PORTABLE CHEST 1 VIEW COMPARISON:   Radiograph 03/04/2022 FINDINGS: Stable cardiomediastinal silhouette. Aortic atherosclerotic calcification. Diffuse interstitial coarsening greatest in the mid and lower lungs is new since 03/04/2022. No focal consolidation, pleural effusion, or pneumothorax. No displaced rib fractures. IMPRESSION: Diffuse interstitial coarsening may be due to atypical infection or edema. Electronically Signed   By: Minerva Fester M.D.   On: 01/08/2023 03:17     Assessment and Plan:   Shortness of breath: ddx includes anemia, worsening AS, new heart failure. - obtain complete TTE - if volume overloaded, would diurese  Chest Pain: mild, atypical, likely related to shortness of breath - trend trops, ECG - consider LHC after GIB addressed/resolved   Risk Assessment/Risk Scores:     TIMI Risk Score for Unstable Angina or Non-ST Elevation MI:   The patient's TIMI risk score is  , which indicates a  % risk of all cause mortality, new or recurrent myocardial infarction or need for urgent revascularization in the next 14 days.  New York Heart Association (NYHA) Functional Class NYHA Class III        For questions or updates, please contact [facility] Please consult [email or web address] for contact info under    Signed, [place], MD  01/08/2023 7:12 AM

## 2023-01-09 ENCOUNTER — Encounter (HOSPITAL_COMMUNITY)
Admission: EM | Disposition: A | Discharge status: Home / Self Care | Payer: Self-pay | Source: Home / Self Care | Attending: Internal Medicine

## 2023-01-09 DIAGNOSIS — I2510 Atherosclerotic heart disease of native coronary artery without angina pectoris: Secondary | ICD-10-CM | POA: Diagnosis not present

## 2023-01-09 DIAGNOSIS — I214 Non-ST elevation (NSTEMI) myocardial infarction: Secondary | ICD-10-CM

## 2023-01-09 DIAGNOSIS — K922 Gastrointestinal hemorrhage, unspecified: Secondary | ICD-10-CM | POA: Diagnosis not present

## 2023-01-09 DIAGNOSIS — I350 Nonrheumatic aortic (valve) stenosis: Secondary | ICD-10-CM

## 2023-01-09 HISTORY — PX: LEFT HEART CATH AND CORONARY ANGIOGRAPHY: CATH118249

## 2023-01-09 LAB — TYPE AND SCREEN
ABO/RH(D): A POS
Antibody Screen: NEGATIVE
Unit division: 0

## 2023-01-09 LAB — BPAM RBC
Blood Product Expiration Date: 202412142359
ISSUE DATE / TIME: 202411250645
Unit Type and Rh: 6200

## 2023-01-09 LAB — BASIC METABOLIC PANEL
Anion gap: 9 (ref 5–15)
BUN: 15 mg/dL (ref 8–23)
CO2: 21 mmol/L — ABNORMAL LOW (ref 22–32)
Calcium: 9 mg/dL (ref 8.9–10.3)
Chloride: 107 mmol/L (ref 98–111)
Creatinine, Ser: 1.25 mg/dL — ABNORMAL HIGH (ref 0.61–1.24)
GFR, Estimated: 59 mL/min — ABNORMAL LOW (ref >60)
Glucose, Bld: 105 mg/dL — ABNORMAL HIGH (ref 70–99)
Potassium: 4.1 mmol/L (ref 3.5–5.1)
Sodium: 137 mmol/L (ref 135–145)

## 2023-01-09 LAB — CBC
HCT: 26.8 % — ABNORMAL LOW (ref 39.0–52.0)
Hemoglobin: 8.9 g/dL — ABNORMAL LOW (ref 13.0–17.0)
MCH: 32.5 pg (ref 26.0–34.0)
MCHC: 33.2 g/dL (ref 30.0–36.0)
MCV: 97.8 fL (ref 80.0–100.0)
Platelets: 203 10*3/uL (ref 150–400)
RBC: 2.74 MIL/uL — ABNORMAL LOW (ref 4.22–5.81)
RDW: 14.6 % (ref 11.5–15.5)
WBC: 5.6 10*3/uL (ref 4.0–10.5)
nRBC: 0 % (ref 0.0–0.2)

## 2023-01-09 LAB — HEMOGLOBIN AND HEMATOCRIT, BLOOD
HCT: 24.9 % — ABNORMAL LOW (ref 39.0–52.0)
Hemoglobin: 8.4 g/dL — ABNORMAL LOW (ref 13.0–17.0)

## 2023-01-09 LAB — HAPTOGLOBIN: Haptoglobin: 154 mg/dL (ref 34–355)

## 2023-01-09 LAB — PROTIME-INR
INR: 1.1 (ref 0.8–1.2)
Prothrombin Time: 14.9 s (ref 11.4–15.2)

## 2023-01-09 LAB — PHOSPHORUS: Phosphorus: 3.7 mg/dL (ref 2.5–4.6)

## 2023-01-09 LAB — MAGNESIUM: Magnesium: 2 mg/dL (ref 1.7–2.4)

## 2023-01-09 SURGERY — LEFT HEART CATH AND CORONARY ANGIOGRAPHY
Anesthesia: LOCAL

## 2023-01-09 MED ORDER — PEG-KCL-NACL-NASULF-NA ASC-C 100 G PO SOLR
0.5000 | Freq: Once | ORAL | Status: AC
  Administered 2023-01-10: 100 g via ORAL

## 2023-01-09 MED ORDER — LIDOCAINE HCL (PF) 1 % IJ SOLN
INTRAMUSCULAR | Status: AC
  Filled 2023-01-09: qty 30

## 2023-01-09 MED ORDER — HEPARIN (PORCINE) IN NACL 1000-0.9 UT/500ML-% IV SOLN
INTRAVENOUS | Status: DC | PRN
  Administered 2023-01-09 (×2): 500 mL

## 2023-01-09 MED ORDER — MIDAZOLAM HCL 2 MG/2ML IJ SOLN
INTRAMUSCULAR | Status: AC
  Filled 2023-01-09: qty 2

## 2023-01-09 MED ORDER — FENTANYL CITRATE (PF) 100 MCG/2ML IJ SOLN
INTRAMUSCULAR | Status: DC | PRN
  Administered 2023-01-09: 25 ug via INTRAVENOUS

## 2023-01-09 MED ORDER — SODIUM CHLORIDE 0.9 % IV SOLN
INTRAVENOUS | Status: AC | PRN
  Administered 2023-01-09: 10 mL/h via INTRAVENOUS

## 2023-01-09 MED ORDER — MIDAZOLAM HCL 2 MG/2ML IJ SOLN
INTRAMUSCULAR | Status: DC | PRN
  Administered 2023-01-09: 2 mg via INTRAVENOUS

## 2023-01-09 MED ORDER — PEG-KCL-NACL-NASULF-NA ASC-C 100 G PO SOLR
1.0000 | Freq: Once | ORAL | Status: DC
  Filled 2023-01-09: qty 1

## 2023-01-09 MED ORDER — SODIUM CHLORIDE 0.9% FLUSH: 3.0000 mL | Freq: Two times a day (BID) | INTRAVENOUS | Status: DC

## 2023-01-09 MED ORDER — SODIUM CHLORIDE 0.9 % WEIGHT BASED INFUSION
1.0000 mL/kg/h | INTRAVENOUS | Status: DC
  Administered 2023-01-09: 1 mL/kg/h via INTRAVENOUS

## 2023-01-09 MED ORDER — SODIUM CHLORIDE 0.9 % WEIGHT BASED INFUSION
3.0000 mL/kg/h | INTRAVENOUS | Status: DC
  Administered 2023-01-09: 3 mL/kg/h via INTRAVENOUS

## 2023-01-09 MED ORDER — FENTANYL CITRATE (PF) 100 MCG/2ML IJ SOLN
INTRAMUSCULAR | Status: AC
  Filled 2023-01-09: qty 2

## 2023-01-09 MED ORDER — LIDOCAINE HCL (PF) 1 % IJ SOLN
INTRAMUSCULAR | Status: DC | PRN
  Administered 2023-01-09: 8 mL

## 2023-01-09 MED ORDER — IOHEXOL 350 MG/ML SOLN
INTRAVENOUS | Status: DC | PRN
  Administered 2023-01-09: 40 mL

## 2023-01-09 MED ORDER — MAGNESIUM CITRATE PO SOLN
1.0000 | Freq: Once | ORAL | Status: AC
  Administered 2023-01-09: 1 via ORAL
  Filled 2023-01-09: qty 296

## 2023-01-09 MED ORDER — PEG-KCL-NACL-NASULF-NA ASC-C 100 G PO SOLR
0.5000 | Freq: Once | ORAL | Status: AC
  Administered 2023-01-09: 100 g via ORAL
  Filled 2023-01-09: qty 1

## 2023-01-09 SURGICAL SUPPLY — 10 items
CATH INFINITI 5FR MULTPACK ANG (CATHETERS) IMPLANT
CLOSURE MYNX CONTROL 5F (Vascular Products) IMPLANT
GUIDEWIRE ANGLED .035X150CM (WIRE) IMPLANT
KIT MICROPUNCTURE NIT STIFF (SHEATH) IMPLANT
PACK CARDIAC CATHETERIZATION (CUSTOM PROCEDURE TRAY) ×2 IMPLANT
SET ATX-X65L (MISCELLANEOUS) IMPLANT
SHEATH PINNACLE 5F 10CM (SHEATH) IMPLANT
SHEATH PROBE COVER 6X72 (BAG) IMPLANT
WIRE EMERALD 3MM-J .035X150CM (WIRE) IMPLANT
WIRE HI TORQ VERSACORE-J 145CM (WIRE) IMPLANT

## 2023-01-09 NOTE — Progress Notes (Signed)
PROGRESS NOTE    Logan Wolfe  ZOX:[id number] DOB: 1946/01/11 DOA: 01/08/2023 PCP: Daisy Floro, MD    Brief Narrative:   Logan Wolfe is a 77 y.o. male with past medical history significant for prostate cancer s/p resection, radiation therapy last February 2024 on hormonal therapy currently, moderate aortic stenosis, mitral regurgitation, CAD, history of SVT, CKD stage II, HLD, GERD who presents to [facility] ED on 11/25 with progressive shortness of breath over the last 2 weeks.  Dyspnea worse with exertion, now unable to walk 15 feet without stopping to catch his breath.  Also reports associated chest tightness during exertion as well with worsening of the symptoms over the last 3 days.  Given his progressive symptoms, he was worried he would not wake up from sleep so sought further care in the ED.  Additionally patient reports ongoing rectal bleeding since June 2024.  Reports as bright red blood.  Following with gastroenterology outpatient; initially worked up with outpatient stool studies that were reported negative and then was placed on budesonide for concerns of his symptoms are related to an inflammatory response versus complication from his radiation from prostate cancer.  Despite use of budesonide, his symptoms continued also associated with some mild dizziness.  Patient endorses use of aspirin, not on any other anticoagulants/antiplatelets.  Denies orthopnea, no lower extremity edema, no cough/congestion.  In the ED, temperature 97.3 F, HR 64, RR 24, BP 122/61, SpO2 100% on room air.  WBC 4.6, hemoglobin 7.8, platelet count 222.  Sodium 138, potassium 4.1, chloride 109, CO2 20, glucose 103, BUN 21, creat 1.31.  BNP 610.0.  High sensitive troponin 310 followed by 1025. TSH 3.841. FOBT positive.  Anemia panel with iron 55, TIBC 377, folate 8.5, vitamin B12 129.  Chest x-ray with diffuse interstitial coarsening may be due to atypical infection versus edema.  EDP consulted cardiology and  gastroenterology.  1 unit PRBC ordered.  TRH consulted for admission for further evaluation and management of GI bleed, elevated troponin  Assessment & Plan:   Lower GI bleed Presenting with persistent bright red blood per rectum since June 2024.  Has been seen by GI outpatient with reported negative stool studies for infectious etiology and was placed on budesonide.  Despite treatment with budesonide symptoms have continued to persist; with hemoglobin noted to be low at 7.8 on admission; baseline hemoglobin 13.0 03/04/2022. -- [facility] following, appreciate assistance -- transfused 1u pRBC on 11/25 -- Hgb 7.8>8.7>8.5>8.9 -- H&H every 12 hours -- Clear diet, n.p.o. after midnight for anticipated colonoscopy 11/27 if cleared by cardiology -- Transfuse for hemoglobin less than 8.0 given cardiac history -- Monitor on telemetry  B12 deficiency Vitamin B12 low at 129. --Vitamin B12 1000 mcg p.o. daily  Elevated troponin likely secondary to type II demand ischemia in the setting of GI bleed Hx CAD Patient endorses progressive dyspnea, chest pain with exertion.  Likely complicated in the setting of acute on chronic blood loss secondary to GI bleed ongoing since June 2024 as above.  Patient currently does not endorse chest pain.  Troponins initially elevated at 310 on admission, up trended to peak of 1000 9/21 followed by 1840.  TTE with LVEF 60 to 65%, no LV regional wall motion abnormalities, LA mildly dilated, severe aortic stenosis.Marland Kitchen -- Cardiology following, appreciate assistance -- Cardiology to consider LHC -- Monitor on telemetry  History of moderate aortic stenosis/mitral regurgitation TTE with severe aortic stenosis with a valve area less than 1.0. -- Cardiology following  as above  Hx SVT -- Metoprolol succinate 50 mg p.o. daily  Acute renal failure on CKD stage II Creatinine 1.31 on admission.  Baseline creatinine 1-1.1.  Etiology likely secondary to ATN from acute blood loss anemia  as above with GI bleed. -- Cr 1.31>1.25 -- Transfused 1 unit PRBC as above -- Avoid nephrotoxins, renal dose all medications -- BMP daily  Hyperlipidemia Lipid profile with total cholesterol 147, HDL 72, LDL 65, triglycerides 51. -- Crestor 20 mg p.o. daily  Anxiety/depression -- Paxil 20 mg p.o. daily  History of prostate cancer s/p resection/radiation Follows with urology, Dr. Mena Goes outpatient.  Radiation, last February 2024 could be related to his ongoing bloody stools as above. --Continue outpatient follow-up with urology    DVT prophylaxis: SCDs Start: 01/08/23 0519    Code Status: Full Code Family Communication: No family present at bedside this morning  Disposition Plan:  Level of care: Progressive Status is: Inpatient Remains inpatient appropriate because: Pending colonoscopy, may need left heart catheterization, needs stability of hemoglobin and specialist sign off before stable for discharge home    Consultants:  Cardiology [facility] gastroenterology  Procedures:  TTE:   Antimicrobials:  None   Subjective: Patient seen examined bedside, resting calmly.  Sitting at edge of bed.  RN present.  Dyspnea much improved.  Overall fatigue much improved as well.  Hemoglobin stable, up to 8.9 this morning.  Seen by GI with tentative plans for colonoscopy tomorrow if cleared by cardiology.  Reports continues with intermittent bright red blood per rectum.  Patient with no other specific complaints, concerns, questions at this time.  Denies headache, no current dizziness, no current chest pain, no nausea/vomiting/diarrhea, no focal weakness, no cough/congestion, no fever/chills/night sweats, no fatigue, no paresthesias.  No acute events overnight per nursing staff.  Objective: Vitals:   01/09/23 0600 01/09/23 0700 01/09/23 0735 01/09/23 0800  BP:   139/61   Pulse: (!) 55 (!) 49 (!) 59 (!) 56  Resp: 13 15 18 19   Temp:   98.1 F (36.7 C)   TempSrc:   Oral   SpO2: 94% 96%  98% 98%  Weight:      Height:        Intake/Output Summary (Last 24 hours) at 01/09/2023 1020 Last data filed at 01/08/2023 1730 Gross per 24 hour  Intake 240 ml  Output --  Net 240 ml   Filed Weights   01/08/23 0210 01/09/23 0349  Weight: 103 kg 104.2 kg    Examination:  Physical Exam: GEN: NAD, alert and oriented x 3, elderly/chronically ill in appearance HEENT: NCAT, PERRL, EOMI, sclera clear, MMM PULM: Conversational dyspnea, speaking in short sentences, tachypneic, CTAB w/o wheezes/crackles, on room air CV: RRR w/ 3/6 RUSB SEM no gallop/rub GI: abd soft, NTND, NABS, no R/G/M MSK: 1+ edema BLE, muscle strength globally intact 5/5 bilateral upper/lower extremities NEURO: CN II-XII intact, no focal deficits, sensation to light touch intact PSYCH: normal mood/affect Integumentary: dry/intact, no rashes or wounds    Data Reviewed: I have personally reviewed following labs and imaging studies  CBC: Recent Labs  Lab 01/08/23 0224 01/08/23 1125 01/08/23 1948 01/09/23 0346  WBC 4.6  --   --  5.6  HGB 7.8* 8.7* 8.5* 8.9*  HCT 23.5* 27.4* 25.7* 26.8*  MCV 103.1*  --   --  97.8  PLT 222  --   --  203   Basic Metabolic Panel: Recent Labs  Lab 01/08/23 0224 01/09/23 0346  NA 138 137  K 4.1 4.1  CL 109 107  CO2 20* 21*  GLUCOSE 103* 105*  BUN 21 15  CREATININE 1.31* 1.25*  CALCIUM 9.0 9.0  MG  --  2.0  PHOS  --  3.7   GFR: Estimated Creatinine Clearance: 60.7 mL/min (A) (by C-G formula based on SCr of 1.25 mg/dL (H)). Liver Function Tests: Recent Labs  Lab 01/08/23 0245  AST 22  ALT 14  ALKPHOS 50  BILITOT 0.3  PROT 6.3*  ALBUMIN 3.6   No results for input(s): "LIPASE", "AMYLASE" in the last 168 hours. No results for input(s): "AMMONIA" in the last 168 hours. Coagulation Profile: Recent Labs  Lab 01/09/23 0346  INR 1.1   Cardiac Enzymes: No results for input(s): "CKTOTAL", "CKMB", "CKMBINDEX", "TROPONINI" in the last 168 hours. BNP (last 3  results) No results for input(s): "PROBNP" in the last 8760 hours. HbA1C: Recent Labs    01/08/23 0224  HGBA1C 4.9   CBG: Recent Labs  Lab 01/08/23 0223  GLUCAP 95   Lipid Profile: Recent Labs    01/08/23 0224  CHOL 147  HDL 72  LDLCALC 65  TRIG 51  CHOLHDL 2.0   Thyroid Function Tests: Recent Labs    01/08/23 0310  TSH 3.841   Anemia Panel: Recent Labs    01/08/23 0245 01/08/23 0310  VITAMINB12  --  129*  FOLATE  --  8.5  TIBC 377  --   IRON 55  --    Sepsis Labs: No results for input(s): "PROCALCITON", "LATICACIDVEN" in the last 168 hours.  No results found for this or any previous visit (from the past 240 hour(s)).       Radiology Studies: ECHOCARDIOGRAM COMPLETE  Result Date: 01/08/2023    ECHOCARDIOGRAM REPORT   Patient Name:   GOEBEL TRUMP Date of Exam: 01/08/2023 Medical Rec #:  [id number]      Height:       73.0 in Accession #:    [id number]     Weight:       227.1 lb Date of Birth:  [birth year]      BSA:          2.271 m Patient Age:    77 years       BP:           120/70 mmHg Patient Gender: M              HR:           56 bpm. Exam Location:  Inpatient Procedure: 2D Echo, Color Doppler and Cardiac Doppler Indications:    Aortic stenosis I35.0  History:        Patient has prior history of Echocardiogram examinations, most                 recent 03/05/2022. CHF, Aortic Valve Disease; Risk                 Factors:Diabetes, Dyslipidemia and Current Smoker.  Sonographer:    Dondra Prader RVT RCS Referring Phys: [id number] JONATHAN SEGARS IMPRESSIONS  1. Left ventricular ejection fraction, by estimation, is 60 to 65%. The left ventricle has normal function. The left ventricle has no regional wall motion abnormalities. Left ventricular diastolic parameters are consistent with Grade II diastolic dysfunction (pseudonormalization).  2. Right ventricular systolic function is normal. The right ventricular size is normal. There is moderately elevated pulmonary artery  systolic pressure. The estimated right ventricular systolic pressure is 56.0 mmHg.  3. Left atrial size  was mildly dilated.  4. The mitral valve is degenerative. Mild mitral valve regurgitation. No evidence of mitral stenosis. Moderate mitral annular calcification.  5. The aortic valve is tricuspid. There is severe calcifcation of the aortic valve. Aortic valve regurgitation is mild to moderate. 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 Aortic valve area, by VTI measures 0.95 cm. Aortic valve  mean gradient measures 29.0 mmHg.  6. The inferior vena cava is dilated in size with <50% respiratory variability, suggesting right atrial pressure of 15 mmHg. FINDINGS  Left Ventricle: Left ventricular ejection fraction, by estimation, is 60 to 65%. The left ventricle has normal function. The left ventricle has no regional wall motion abnormalities. The left ventricular internal cavity size was normal in size. There is  no left ventricular hypertrophy. Left ventricular diastolic parameters are consistent with Grade II diastolic dysfunction (pseudonormalization). Right Ventricle: The right ventricular size is normal. No increase in right ventricular wall thickness. Right ventricular systolic function is normal. There is moderately elevated pulmonary artery systolic pressure. The tricuspid regurgitant velocity is 3.20 m/s, and with an assumed right atrial pressure of 15 mmHg, the estimated right ventricular systolic pressure is 56.0 mmHg. Left Atrium: Left atrial size was mildly dilated. Right Atrium: Right atrial size was normal in size. Pericardium: There is no evidence of pericardial effusion. Mitral Valve: The mitral valve is degenerative in appearance. There is moderate calcification of the mitral valve leaflet(s). Moderate mitral annular calcification. Mild mitral valve regurgitation. No evidence of mitral valve stenosis. Tricuspid Valve: The tricuspid valve is normal in structure. Tricuspid valve  regurgitation is trivial. Aortic Valve: The aortic valve is tricuspid. There is severe calcifcation of the aortic valve. Aortic valve regurgitation is mild to moderate. Aortic regurgitation PHT measures 638 msec. Severe aortic stenosis is present. Aortic valve mean gradient measures 29.0 mmHg. Aortic valve peak gradient measures 43.6 mmHg. Aortic valve area, by VTI measures 0.95 cm. Pulmonic Valve: The pulmonic valve was normal in structure. Pulmonic valve regurgitation is not visualized. Aorta: The aortic root is normal in size and structure. Venous: The inferior vena cava is dilated in size with less than 50% respiratory variability, suggesting right atrial pressure of 15 mmHg. IAS/Shunts: No atrial level shunt detected by color flow Doppler.  LEFT VENTRICLE PLAX 2D LVIDd:         5.10 cm   Diastology LVIDs:         2.90 cm   LV e' medial:    5.57 cm/s LV PW:         1.30 cm   LV E/e' medial:  12.3 LV IVS:        1.00 cm   LV e' lateral:   7.97 cm/s LVOT diam:     1.70 cm   LV E/e' lateral: 8.6 LV SV:         73 LV SV Index:   32 LVOT Area:     2.27 cm  RIGHT VENTRICLE             IVC RV Basal diam:  3.70 cm     IVC diam: 2.10 cm RV S prime:     11.40 cm/s TAPSE (M-mode): 3.2 cm LEFT ATRIUM             Index        RIGHT ATRIUM           Index LA diam:        4.20 cm 1.85 cm/m   RA Area:  15.07 cm LA Vol (A2C):   60.4 ml 26.58 ml/m  RA Volume:   38.40 ml  16.91 ml/m LA Vol (A4C):   73.7 ml 32.45 ml/m LA Biplane Vol: 73.5 ml 32.37 ml/m  AORTIC VALVE                     PULMONIC VALVE AV Area (Vmax):    0.94 cm      PV Vmax:       0.83 m/s AV Area (Vmean):   0.97 cm      PV Peak grad:  2.8 mmHg AV Area (VTI):     0.95 cm AV Vmax:           330.00 cm/s AV Vmean:          226.667 cm/s AV VTI:            0.765 m AV Peak Grad:      43.6 mmHg AV Mean Grad:      29.0 mmHg LVOT Vmax:         137.00 cm/s LVOT Vmean:        97.300 cm/s LVOT VTI:          0.320 m LVOT/AV VTI ratio: 0.42 AI PHT:            638  msec AR Vena Contracta: 0.40 cm  AORTA Ao Root diam: 2.80 cm Ao Asc diam:  3.40 cm MITRAL VALVE               TRICUSPID VALVE MV Area (PHT): 3.91 cm    TR Peak grad:   41.0 mmHg MV Decel Time: 194 msec    TR Vmax:        320.00 cm/s MV E velocity: 68.30 cm/s MV A velocity: 66.90 cm/s  SHUNTS MV E/A ratio:  1.02        Systemic VTI:  0.32 m                            Systemic Diam: 1.70 cm Dalton McleanMD Electronically signed by Wilfred Lacy Signature Date/Time: 01/08/2023/1:09:49 PM    Final    DG Chest Portable 1 View  Result Date: 01/08/2023 CLINICAL DATA:  Shortness of breath and chest pain EXAM: PORTABLE CHEST 1 VIEW COMPARISON:  Radiograph 03/04/2022 FINDINGS: Stable cardiomediastinal silhouette. Aortic atherosclerotic calcification. Diffuse interstitial coarsening greatest in the mid and lower lungs is new since 03/04/2022. No focal consolidation, pleural effusion, or pneumothorax. No displaced rib fractures. IMPRESSION: Diffuse interstitial coarsening may be due to atypical infection or edema. Electronically Signed   By: Minerva Fester M.D.   On: 01/08/2023 03:17        Scheduled Meds:  apalutamide  240 mg Oral Daily   budesonide  9 mg Oral Daily   vitamin B-12  1,000 mcg Oral Daily   metoprolol succinate  50 mg Oral Daily   PARoxetine  20 mg Oral q morning   relugolix  120 mg Oral Daily   rosuvastatin  20 mg Oral Daily   sodium chloride flush  3 mL Intravenous Q12H   Continuous Infusions:   LOS: 1 day    Time spent: 52 minutes spent on chart review, discussion with nursing staff, consultants, updating family and interview/physical exam; more than 50% of that time was spent in counseling and/or coordination of care.    Alvira Philips [place], DO [facility] Available via Epic secure chat 7am-7pm After  these hours, please refer to coverage provider listed on [email or web address] 01/09/2023, 10:20 AM

## 2023-01-09 NOTE — Interval H&P Note (Signed)
History and Physical Interval Note:  01/09/2023 3:04 PM  Logan Wolfe  has presented today for surgery, with the diagnosis of elevated trop.  The various methods of treatment have been discussed with the patient and family. After consideration of risks, benefits and other options for treatment, the patient has consented to  Procedure(s): LEFT HEART CATH AND CORONARY ANGIOGRAPHY (N/A) as a surgical intervention.  The patient's history has been reviewed, patient examined, no change in status, stable for surgery.  I have reviewed the patient's chart and labs.  Questions were answered to the patient's satisfaction.     Yates Decamp

## 2023-01-09 NOTE — Progress Notes (Signed)
Gerhard Munch 8:44 AM  Subjective: Patient seen and examined and case discussed with his nurse and he sees bright red blood with every bowel movement which again happened this morning but he does not pass blood without a bowel movement and is feeling better than he was has not seen cardiology yet today and has no new complaints  Objective: Stable Afebrile No Acute Distress in Good Spirits Abdomen Is Soft Nontender BUN Decreased Creatinine Slight Decrease Globin Is Stable  Assessment: Bright red blood per rectum  Plan: I have tentatively reserved an 8:30 AM colonoscopy time however I am waiting on cardiology to say I can proceed however if they want to proceed with further cardiac testing we can proceed either later in the week or as an outpatient and his procedure needs to be done at the hospital which we discussed based on needing either APC or RFA in case this is radiation proctitis and patient understands and agrees with the plan  [facility]  office [phone] After 5PM or if no answer call [phone]

## 2023-01-09 NOTE — Progress Notes (Signed)
Mobility Specialist Progress Note:   01/09/23 1118  Mobility  Activity Ambulated with assistance in hallway  Level of Assistance  (MinG)  Assistive Device None  Distance Ambulated (ft) 400 ft  Activity Response Tolerated well  Mobility Referral Yes  $Mobility charge 1 Mobility  Mobility Specialist Start Time (ACUTE ONLY) 1100  Mobility Specialist Stop Time (ACUTE ONLY) 1107  Mobility Specialist Time Calculation (min) (ACUTE ONLY) 7 min   Pre Mobility: 53 HR , 93% SpO2 During Mobility: 82 HR , 95% SpO2 Post Mobility: 54 HR ,  96% SpO2  Pt received in bed, eager to mobility. SB to stand. MinG during ambulation. Pt c/o slight SOB towards EOS, and displayed slight wobbly gait, otherwise asx throughout. VSS. Pt returned to bed with call bell in reach and all needs met.   Leory Plowman  Mobility Specialist Please contact via [employer] office at [phone]

## 2023-01-09 NOTE — Progress Notes (Addendum)
Patient Name: Logan Wolfe Date of Encounter: 01/09/2023 [facility] Cardiologist: Lance Muss, MD   Interval Summary  .    Sitting up in bed, no complaints this morning.   Vital Signs .    Vitals:   01/09/23 0600 01/09/23 0700 01/09/23 0735 01/09/23 0800  BP:   139/61   Pulse: (!) 55 (!) 49 (!) 59 (!) 56  Resp: 13 15 18 19   Temp:   98.1 F (36.7 C)   TempSrc:   Oral   SpO2: 94% 96% 98% 98%  Weight:      Height:        Intake/Output Summary (Last 24 hours) at 01/09/2023 1019 Last data filed at 01/08/2023 1730 Gross per 24 hour  Intake 240 ml  Output --  Net 240 ml      01/09/2023    3:49 AM 01/08/2023    2:10 AM 11/06/2022    9:58 AM  Last 3 Weights  Weight (lbs) 229 lb 12.8 oz 227 lb 1.2 oz 227 lb  Weight (kg) 104.237 kg 103 kg 102.967 kg      Telemetry/ECG    Sinus Rhythm - Personally Reviewed  Physical Exam .   GEN: No acute distress.   Neck: No JVD Cardiac: RRR, + systolic murmur RUSB, rubs, or gallops.  Respiratory: Clear to auscultation bilaterally. GI: Soft, nontender, non-distended  MS: No edema  Assessment & Plan .     77 y.o. male with a hx of metastatic prostate CA, moderate AS, HLD, CKD2, CAD, GERD who is being seen 01/08/2023 for the evaluation of shortness of breath at the request of Dolly Rias MD.   Chest pain/Dyspnea on exertion NSTEMI -- reports episodes of intermittent chest discomfort and shortness of breath with exertion which have been worse in the past several months -- EKG showed slight ST depression in lateral leads -- hsTn 1025>>1919>>1921>>1840 -- his coronary anatomy is unknown. Echo shows LVEF of 60-65% no rWMA, normal RV -- with his anemia/GIB, he needs to undergo colonoscopy -- per discussion at the bedside with Dr. Excell Seltzer, will plan for diagnostic cardiac cath today to determine further recommendations moving forward  Informed Consent   Shared Decision Making/Informed Consent The risks [stroke  (1 in 1000), death (1 in 1000), kidney failure [usually temporary] (1 in 500), bleeding (1 in 200), allergic reaction [possibly serious] (1 in 200)], benefits (diagnostic support and management of coronary artery disease) and alternatives of a cardiac catheterization were discussed in detail with Mr. Netherland and he is willing to proceed.  Acute blood loss anemia 2/2 GIB -- reports intermittent episodes of rectal bleeding over the past couple of months -- Hgb 7.8 on admission, FOBT positive. S/p 1 unit -- GI following with plans for colonoscopy pending cardiac work up  Severe AS -- echo this admission with progression of aortic stenosis to severe paradoxical low flow/low gradient AS, ASA 0.95, mean gradient . Moderately elevated PASP -- pending work up as above, will need further discussion regarding SAVR/TAVR management  Hx of metastatic prostate CA -- follows with [facility], s/p resection/radiation and on hormonal therapy -- management per primary    HLD -- on statin  For questions or updates, please contact [facility] Please consult [email or web address] for contact info under        Signed, Laverda Page, NP   Patient seen, examined. Available data reviewed. Agree with findings, assessment, and plan as outlined by Laverda Page, NP.  On exam the patient  is a pleasant gentleman in no distress.  Carotid upstrokes normal, JVP is normal, lungs are clear, heart is regular rate and rhythm with a 3/6 late peaking crescendo decrescendo murmur best heard at the apex with absent A2, abdomen is soft and nontender, extremities have no edema.  The patient presents with symptomatic anemia, progressive angina and rules and for MI (likely type II MI from demand ischemia).  He reports breathlessness and vague angina that has worsened and now occurs with fairly low-level activity over the past 3 to 6 months.  His echo and exam are consistent with severe paradoxical low-flow low gradient  aortic stenosis.  He is having lower GI bleeding and GI notes are reviewed with plans for colonoscopy.  I think we should proceed with cardiac catheterization for further risk stratification before he undergoes colonoscopy.  We need to make sure that he does not have severe coronary artery disease in addition to his aortic stenosis which would increase his periprocedural risk.  We discussed proceeding with diagnostic heart catheterization today.  He would not be a candidate for PCI or DAPT because of concern of lower GI bleeding.  Favor diagnostic catheterization from a femoral approach to avoid giving him IV heparin.  He will ultimately require treatment of his severe aortic stenosis pending findings at cardiac catheterization today. I have reviewed the risks, indications, and alternatives to cardiac catheterization, possible angioplasty, and stenting with the patient. Risks include but are not limited to bleeding, infection, vascular injury, stroke, myocardial infection, arrhythmia, kidney injury, radiation-related injury in the case of prolonged fluoroscopy use, emergency cardiac surgery, and death. The patient understands the risks of serious complication is 1-2 in 1000 with diagnostic cardiac cath. Further recommendations pending cardiac cath results. Otherwise as outlined above.   Tonny Bollman, M.D. 01/09/2023 10:39 AM

## 2023-01-09 NOTE — Progress Notes (Signed)
Catheterization discussed with Dr. Jacinto Halim and he has okayed [place] to proceed with colonoscopy tomorrow and will begin prep at 730 tonight based on his recommendation and will start clear liquid diet as per cardiology team before that and will plan colonoscopy at 830 tomorrow morning and then hopefully patient can go home obviously depending on findings and how he is doing

## 2023-01-09 NOTE — H&P (View-Only) (Signed)
Patient Name: Logan Wolfe Date of Encounter: 01/09/2023 [facility] Cardiologist: Lance Muss, MD   Interval Summary  .    Sitting up in bed, no complaints this morning.   Vital Signs .    Vitals:   01/09/23 0600 01/09/23 0700 01/09/23 0735 01/09/23 0800  BP:   139/61   Pulse: (!) 55 (!) 49 (!) 59 (!) 56  Resp: 13 15 18 19   Temp:   98.1 F (36.7 C)   TempSrc:   Oral   SpO2: 94% 96% 98% 98%  Weight:      Height:        Intake/Output Summary (Last 24 hours) at 01/09/2023 1019 Last data filed at 01/08/2023 1730 Gross per 24 hour  Intake 240 ml  Output --  Net 240 ml      01/09/2023    3:49 AM 01/08/2023    2:10 AM 11/06/2022    9:58 AM  Last 3 Weights  Weight (lbs) 229 lb 12.8 oz 227 lb 1.2 oz 227 lb  Weight (kg) 104.237 kg 103 kg 102.967 kg      Telemetry/ECG    Sinus Rhythm - Personally Reviewed  Physical Exam .   GEN: No acute distress.   Neck: No JVD Cardiac: RRR, + systolic murmur RUSB, rubs, or gallops.  Respiratory: Clear to auscultation bilaterally. GI: Soft, nontender, non-distended  MS: No edema  Assessment & Plan .     77 y.o. male with a hx of metastatic prostate CA, moderate AS, HLD, CKD2, CAD, GERD who is being seen 01/08/2023 for the evaluation of shortness of breath at the request of Dolly Rias MD.   Chest pain/Dyspnea on exertion NSTEMI -- reports episodes of intermittent chest discomfort and shortness of breath with exertion which have been worse in the past several months -- EKG showed slight ST depression in lateral leads -- hsTn 1025>>1919>>1921>>1840 -- his coronary anatomy is unknown. Echo shows LVEF of 60-65% no rWMA, normal RV -- with his anemia/GIB, he needs to undergo colonoscopy -- per discussion at the bedside with Dr. Excell Seltzer, will plan for diagnostic cardiac cath today to determine further recommendations moving forward  Informed Consent   Shared Decision Making/Informed Consent The risks [stroke  (1 in 1000), death (1 in 1000), kidney failure [usually temporary] (1 in 500), bleeding (1 in 200), allergic reaction [possibly serious] (1 in 200)], benefits (diagnostic support and management of coronary artery disease) and alternatives of a cardiac catheterization were discussed in detail with Mr. Casares and he is willing to proceed.  Acute blood loss anemia 2/2 GIB -- reports intermittent episodes of rectal bleeding over the past couple of months -- Hgb 7.8 on admission, FOBT positive. S/p 1 unit -- GI following with plans for colonoscopy pending cardiac work up  Severe AS -- echo this admission with progression of aortic stenosis to severe paradoxical low flow/low gradient AS, ASA 0.95, mean gradient . Moderately elevated PASP -- pending work up as above, will need further discussion regarding SAVR/TAVR management  Hx of metastatic prostate CA -- follows with [facility], s/p resection/radiation and on hormonal therapy -- management per primary    HLD -- on statin  For questions or updates, please contact [facility] Please consult [email or web address] for contact info under        Signed, Laverda Page, NP   Patient seen, examined. Available data reviewed. Agree with findings, assessment, and plan as outlined by Laverda Page, NP.  On exam the patient  is a pleasant gentleman in no distress.  Carotid upstrokes normal, JVP is normal, lungs are clear, heart is regular rate and rhythm with a 3/6 late peaking crescendo decrescendo murmur best heard at the apex with absent A2, abdomen is soft and nontender, extremities have no edema.  The patient presents with symptomatic anemia, progressive angina and rules and for MI (likely type II MI from demand ischemia).  He reports breathlessness and vague angina that has worsened and now occurs with fairly low-level activity over the past 3 to 6 months.  His echo and exam are consistent with severe paradoxical low-flow low gradient  aortic stenosis.  He is having lower GI bleeding and GI notes are reviewed with plans for colonoscopy.  I think we should proceed with cardiac catheterization for further risk stratification before he undergoes colonoscopy.  We need to make sure that he does not have severe coronary artery disease in addition to his aortic stenosis which would increase his periprocedural risk.  We discussed proceeding with diagnostic heart catheterization today.  He would not be a candidate for PCI or DAPT because of concern of lower GI bleeding.  Favor diagnostic catheterization from a femoral approach to avoid giving him IV heparin.  He will ultimately require treatment of his severe aortic stenosis pending findings at cardiac catheterization today. I have reviewed the risks, indications, and alternatives to cardiac catheterization, possible angioplasty, and stenting with the patient. Risks include but are not limited to bleeding, infection, vascular injury, stroke, myocardial infection, arrhythmia, kidney injury, radiation-related injury in the case of prolonged fluoroscopy use, emergency cardiac surgery, and death. The patient understands the risks of serious complication is 1-2 in 1000 with diagnostic cardiac cath. Further recommendations pending cardiac cath results. Otherwise as outlined above.   Tonny Bollman, M.D. 01/09/2023 10:39 AM

## 2023-01-10 ENCOUNTER — Inpatient Hospital Stay (HOSPITAL_COMMUNITY): Payer: HMO | Admitting: Anesthesiology

## 2023-01-10 ENCOUNTER — Encounter (HOSPITAL_COMMUNITY): Payer: Self-pay | Admitting: Cardiology

## 2023-01-10 ENCOUNTER — Encounter (HOSPITAL_COMMUNITY)
Admission: EM | Disposition: A | Discharge status: Home / Self Care | Payer: Self-pay | Source: Home / Self Care | Attending: Internal Medicine

## 2023-01-10 DIAGNOSIS — K922 Gastrointestinal hemorrhage, unspecified: Secondary | ICD-10-CM | POA: Diagnosis not present

## 2023-01-10 DIAGNOSIS — K6389 Other specified diseases of intestine: Secondary | ICD-10-CM | POA: Diagnosis not present

## 2023-01-10 DIAGNOSIS — K5521 Angiodysplasia of colon with hemorrhage: Secondary | ICD-10-CM

## 2023-01-10 DIAGNOSIS — I214 Non-ST elevation (NSTEMI) myocardial infarction: Secondary | ICD-10-CM | POA: Insufficient documentation

## 2023-01-10 DIAGNOSIS — E538 Deficiency of other specified B group vitamins: Secondary | ICD-10-CM | POA: Insufficient documentation

## 2023-01-10 DIAGNOSIS — I350 Nonrheumatic aortic (valve) stenosis: Secondary | ICD-10-CM | POA: Diagnosis not present

## 2023-01-10 DIAGNOSIS — N1831 Chronic kidney disease, stage 3a: Secondary | ICD-10-CM | POA: Insufficient documentation

## 2023-01-10 DIAGNOSIS — D62 Acute posthemorrhagic anemia: Secondary | ICD-10-CM | POA: Insufficient documentation

## 2023-01-10 HISTORY — PX: COLONOSCOPY WITH PROPOFOL: SHX5780

## 2023-01-10 HISTORY — PX: HOT HEMOSTASIS: SHX5433

## 2023-01-10 HISTORY — PX: BIOPSY: SHX5522

## 2023-01-10 LAB — CBC
HCT: 29.6 % — ABNORMAL LOW (ref 39.0–52.0)
Hemoglobin: 9.8 g/dL — ABNORMAL LOW (ref 13.0–17.0)
MCH: 33.2 pg (ref 26.0–34.0)
MCHC: 33.1 g/dL (ref 30.0–36.0)
MCV: 100.3 fL — ABNORMAL HIGH (ref 80.0–100.0)
Platelets: 210 10*3/uL (ref 150–400)
RBC: 2.95 MIL/uL — ABNORMAL LOW (ref 4.22–5.81)
RDW: 14.1 % (ref 11.5–15.5)
WBC: 6.5 10*3/uL (ref 4.0–10.5)
nRBC: 0 % (ref 0.0–0.2)

## 2023-01-10 LAB — BASIC METABOLIC PANEL
Anion gap: 11 (ref 5–15)
BUN: 14 mg/dL (ref 8–23)
CO2: 21 mmol/L — ABNORMAL LOW (ref 22–32)
Calcium: 9.5 mg/dL (ref 8.9–10.3)
Chloride: 108 mmol/L (ref 98–111)
Creatinine, Ser: 1.32 mg/dL — ABNORMAL HIGH (ref 0.61–1.24)
GFR, Estimated: 55 mL/min — ABNORMAL LOW (ref >60)
Glucose, Bld: 111 mg/dL — ABNORMAL HIGH (ref 70–99)
Potassium: 4 mmol/L (ref 3.5–5.1)
Sodium: 140 mmol/L (ref 135–145)

## 2023-01-10 SURGERY — COLONOSCOPY WITH PROPOFOL
Anesthesia: Monitor Anesthesia Care

## 2023-01-10 MED ORDER — PROPOFOL 10 MG/ML IV BOLUS
INTRAVENOUS | Status: DC | PRN
  Administered 2023-01-10: 30 mg via INTRAVENOUS
  Administered 2023-01-10 (×2): 20 mg via INTRAVENOUS
  Administered 2023-01-10 (×2): 30 mg via INTRAVENOUS

## 2023-01-10 MED ORDER — SODIUM CHLORIDE 0.9 % IV SOLN: INTRAVENOUS | Status: DC | PRN

## 2023-01-10 MED ORDER — VITAMIN B 12 500 MCG PO TABS: 500.0000 ug | ORAL_TABLET | Freq: Every day | ORAL | 0 refills | Status: AC

## 2023-01-10 MED ORDER — ROSUVASTATIN CALCIUM 20 MG PO TABS: 20.0000 mg | ORAL_TABLET | Freq: Every day | ORAL | 0 refills | Status: DC

## 2023-01-10 MED ORDER — LIDOCAINE 2% (20 MG/ML) 5 ML SYRINGE
INTRAMUSCULAR | Status: DC | PRN
  Administered 2023-01-10: 50 mg via INTRAVENOUS

## 2023-01-10 MED ORDER — SIMETHICONE 80 MG PO CHEW
80.0000 mg | CHEWABLE_TABLET | Freq: Four times a day (QID) | ORAL | Status: DC | PRN
  Administered 2023-01-10: 80 mg via ORAL
  Filled 2023-01-10: qty 1

## 2023-01-10 MED ORDER — ONDANSETRON HCL 4 MG/2ML IJ SOLN
INTRAMUSCULAR | Status: DC | PRN
  Administered 2023-01-10: 4 mg via INTRAVENOUS

## 2023-01-10 MED ORDER — ASPIRIN 81 MG PO TBEC: 81.0000 mg | DELAYED_RELEASE_TABLET | Freq: Every day | ORAL | 0 refills | Status: AC

## 2023-01-10 MED ORDER — PHENYLEPHRINE 80 MCG/ML (10ML) SYRINGE FOR IV PUSH (FOR BLOOD PRESSURE SUPPORT)
PREFILLED_SYRINGE | INTRAVENOUS | Status: DC | PRN
  Administered 2023-01-10 (×3): 80 ug via INTRAVENOUS

## 2023-01-10 MED ORDER — ASPIRIN 81 MG PO TBEC: 81.0000 mg | DELAYED_RELEASE_TABLET | Freq: Every day | ORAL | Status: DC

## 2023-01-10 SURGICAL SUPPLY — 21 items
ELECT REM PT RETURN 9FT ADLT (ELECTROSURGICAL)
ELECTRODE REM PT RTRN 9FT ADLT (ELECTROSURGICAL) IMPLANT
FCP BXJMBJMB 240X2.8X (CUTTING FORCEPS)
FLOOR PAD 36X40 (MISCELLANEOUS) ×2
FORCEPS BIOP RAD 4 LRG CAP 4 (CUTTING FORCEPS) IMPLANT
FORCEPS BIOP RJ4 240 W/NDL (CUTTING FORCEPS)
FORCEPS BXJMBJMB 240X2.8X (CUTTING FORCEPS) IMPLANT
INJECTOR/SNARE I SNARE (MISCELLANEOUS) IMPLANT
LUBRICANT JELLY 4.5OZ STERILE (MISCELLANEOUS) IMPLANT
MANIFOLD NEPTUNE II (INSTRUMENTS) IMPLANT
NDL SCLEROTHERAPY 25GX240 (NEEDLE) IMPLANT
NEEDLE SCLEROTHERAPY 25GX240 (NEEDLE) IMPLANT
PAD FLOOR 36X40 (MISCELLANEOUS) ×3 IMPLANT
PROBE APC STR FIRE (PROBE) IMPLANT
PROBE INJECTION GOLD 7FR (MISCELLANEOUS) IMPLANT
SNARE ROTATE MED OVAL 20MM (MISCELLANEOUS) IMPLANT
SYR 50ML LL SCALE MARK (SYRINGE) IMPLANT
TRAP SPECIMEN MUCOUS 40CC (MISCELLANEOUS) IMPLANT
TUBING ENDO SMARTCAP PENTAX (MISCELLANEOUS) IMPLANT
TUBING IRRIGATION ENDOGATOR (MISCELLANEOUS) ×3 IMPLANT
WATER STERILE IRR 1000ML POUR (IV SOLUTION) IMPLANT

## 2023-01-10 NOTE — Anesthesia Postprocedure Evaluation (Signed)
Anesthesia Post Note  Patient: Logan Wolfe  Procedure(s) Performed: COLONOSCOPY WITH PROPOFOL BIOPSY HOT HEMOSTASIS ARGON PLASMA COAGULATION     Patient location during evaluation: PACU Anesthesia Type: MAC Level of consciousness: awake and alert Pain management: pain level controlled Vital Signs Assessment: post-procedure vital signs reviewed and stable Respiratory status: spontaneous breathing, nonlabored ventilation and respiratory function stable Cardiovascular status: blood pressure returned to baseline and stable Postop Assessment: no apparent nausea or vomiting Anesthetic complications: no   No notable events documented.  Last Vitals:  Vitals:   01/10/23 0950 01/10/23 1022  BP: (!) 111/52 116/85  Pulse: 64 (!) 50  Resp: 20 17  Temp:  (!) 36.4 C  SpO2: 90% 92%    Last Pain:  Vitals:   01/10/23 1022  TempSrc: Oral  PainSc: 0-No pain                 Lannie Fields

## 2023-01-10 NOTE — Transfer of Care (Signed)
Immediate Anesthesia Transfer of Care Note  Patient: Logan Wolfe  Procedure(s) Performed: COLONOSCOPY WITH PROPOFOL BIOPSY HOT HEMOSTASIS ARGON PLASMA COAGULATION  Patient Location: PACU and Endoscopy Unit  Anesthesia Type:MAC and General  Level of Consciousness: awake, alert , and oriented  Airway & Oxygen Therapy: Patient Spontanous Breathing and aerosol face mask  Post-op Assessment: Report given to RN and Post -op Vital signs reviewed and stable  Post vital signs: Reviewed and stable  Last Vitals:  Vitals Value Taken Time  BP 104/57 01/10/23 0945  Temp 36.1 C 01/10/23 0945  Pulse 62 01/10/23 0947  Resp 23 01/10/23 0947  SpO2 92 % 01/10/23 0947  Vitals shown include unfiled device data.  Last Pain:  Vitals:   01/10/23 0945  TempSrc: Temporal  PainSc: 0-No pain         Complications: No notable events documented.

## 2023-01-10 NOTE — Progress Notes (Signed)
Patient returned from having a BM, complaining that he experienced some bleeding when wiping himself.  NP from cardiology was present, and explained that small amount of bleeding could be residual from procedure.  This nurse explained to patient to keep monitoring for bleeding.

## 2023-01-10 NOTE — Progress Notes (Addendum)
Heart Failure Navigator Progress Note  Assessed for Heart & Vascular TOC clinic readiness.  Patient does not meet criteria due to .- 60-65%, severe AS, anemia/GIB  Navigator will sign off at this time.   Rhae Hammock, BSN, Scientist, clinical (histocompatibility and immunogenetics) Only

## 2023-01-10 NOTE — Progress Notes (Signed)
Patient and wife verbalized understanding of dc instructions. All belongings and dc papers gven to wife.

## 2023-01-10 NOTE — Progress Notes (Signed)
Patient returned from colonoscopy.  VSS.  Patient c/o gas discomfort but no pain.    01/10/23 1022  Vitals  Temp (!) 97.5 F (36.4 C)  Temp Source Oral  BP 116/85  MAP (mmHg) 94  BP Location Left Arm  BP Method Automatic  Patient Position (if appropriate) Lying  Pulse Rate (!) 50  Pulse Rate Source Monitor  ECG Heart Rate (!) 52  Resp 17  Level of Consciousness  Level of Consciousness Alert  MEWS COLOR  MEWS Score Color Green  Oxygen Therapy  SpO2 92 %  O2 Device Room Air  Pain Assessment  Pain Scale 0-10  Pain Score 0  MEWS Score  MEWS Temp 0  MEWS Systolic 0  MEWS Pulse 0  MEWS RR 0  MEWS LOC 0  MEWS Score 0

## 2023-01-10 NOTE — Anesthesia Preprocedure Evaluation (Addendum)
Anesthesia Evaluation  Patient identified by MRN, date of birth, ID band Patient awake    Reviewed: Allergy & Precautions, H&P , NPO status , Patient's Chart, lab work & pertinent test results, reviewed documented beta blocker date and time   Airway Mallampati: II       Dental  (+) Teeth Intact, Dental Advisory Given   Pulmonary Current Smoker   breath sounds clear to auscultation       Cardiovascular hypertension (152/60 preop), Pt. on medications and Pt. on home beta blockers pulmonary hypertension (mod pHTN)+CHF (LVEF 65%, grade 2 diastolic dysfunction)  + Valvular Problems/Murmurs (mild MR, mild-mod AI) MR and AI  Rhythm:Regular Rate:Normal  Echo 01/08/23  1. Left ventricular ejection fraction, by estimation, is 60 to 65%. The  left ventricle has normal function. The left ventricle has no regional  wall motion abnormalities. Left ventricular diastolic parameters are  consistent with Grade II diastolic  dysfunction (pseudonormalization).   2. Right ventricular systolic function is normal. The right ventricular  size is normal. There is moderately elevated pulmonary artery systolic  pressure. The estimated right ventricular systolic pressure is 56.0 mmHg.   3. Left atrial size was mildly dilated.   4. The mitral valve is degenerative. Mild mitral valve regurgitation. No  evidence of mitral stenosis. Moderate mitral annular calcification.   5. The aortic valve is tricuspid. There is severe calcifcation of the  aortic valve. Aortic valve regurgitation is mild to moderate. Severe  paradoxical low flow/low gradient aortic valve stenosis. Aortic valve  area, by VTI measures 0.95 cm. Aortic valve   mean gradient measures 29.0 mmHg.   6. The inferior vena cava is dilated in size with <50% respiratory  variability, suggesting right atrial pressure of 15 mmHg.     Neuro/Psych  PSYCHIATRIC DISORDERS Anxiety Depression    negative  neurological ROS     GI/Hepatic Neg liver ROS,GERD  Controlled,,BRBPR   Endo/Other  Obesity BMI 30  Renal/GU Renal InsufficiencyRenal diseaseCr 1.25  negative genitourinary   Musculoskeletal negative musculoskeletal ROS (+)    Abdominal  (+) + obese  Peds negative pediatric ROS (+)  Hematology  (+) Blood dyscrasia, anemia Hb 9.8   Anesthesia Other Findings   Reproductive/Obstetrics negative OB ROS                             Anesthesia Physical Anesthesia Plan  ASA: 3  Anesthesia Plan: MAC   Post-op Pain Management:    Induction:   PONV Risk Score and Plan: 2 and Propofol infusion and TIVA  Airway Management Planned: Natural Airway and Simple Face Mask  Additional Equipment: None  Intra-op Plan:   Post-operative Plan:   Informed Consent: I have reviewed the patients History and Physical, chart, labs and discussed the procedure including the risks, benefits and alternatives for the proposed anesthesia with the patient or authorized representative who has indicated his/her understanding and acceptance.       Plan Discussed with: CRNA  Anesthesia Plan Comments:        Anesthesia Quick Evaluation

## 2023-01-10 NOTE — Addendum Note (Signed)
Addendum  created 01/10/23 1331 by Jimmey Ralph, CRNA   Attestation recorded in [place], Intraprocedure Attestations filed

## 2023-01-10 NOTE — Discharge Summary (Signed)
Physician Discharge Summary  Logan Wolfe YQM:[id number] DOB: 1945/05/24 DOA: 01/08/2023  PCP: Daisy Floro, MD  Admit date: 01/08/2023 Discharge date: 01/10/2023  Admitted From: Home Disposition:  Home  Recommendations for Outpatient Follow-up:  Follow up with PCP in 1 week Follow up with cardiology Follow-up with GI in 2 weeks  Discharge Condition: Stable CODE STATUS: Full code Diet recommendation: Heart healthy diet  Brief/Interim Summary: Logan Wolfe is a 77 y.o. male with past medical history significant for prostate cancer s/p resection, radiation therapy last February 2024 on hormonal therapy currently, moderate aortic stenosis, mitral regurgitation, CAD, history of SVT, CKD stage II, HLD, GERD who presents to [facility] ED on 11/25 with progressive shortness of breath over the last 2 weeks.  Dyspnea worse with exertion, now unable to walk 15 feet without stopping to catch his breath.  Also reports associated chest tightness during exertion as well with worsening of the symptoms over the last 3 days.  Given his progressive symptoms, he was worried he would not wake up from sleep so sought further care in the ED.  Additionally patient reports ongoing rectal bleeding since June 2024.  Reports as bright red blood.  Following with gastroenterology outpatient; initially worked up with outpatient stool studies that were reported negative and then was placed on budesonide for concerns of his symptoms are related to an inflammatory response versus complication from his radiation from prostate cancer.  Despite use of budesonide, his symptoms continued also associated with some mild dizziness.  Patient endorses use of aspirin, not on any other anticoagulants/antiplatelets.  Denies orthopnea, no lower extremity edema, no cough/congestion.   In the ED, temperature 97.3 F, HR 64, RR 24, BP 122/61, SpO2 100% on room air.  WBC 4.6, hemoglobin 7.8, platelet count 222.  Sodium 138, potassium 4.1,  chloride 109, CO2 20, glucose 103, BUN 21, creat 1.31.  BNP 610.0.  High sensitive troponin 310 followed by 1025. TSH 3.841. FOBT positive.  Anemia panel with iron 55, TIBC 377, folate 8.5, vitamin B12 129.  Chest x-ray with diffuse interstitial coarsening may be due to atypical infection versus edema.  EDP consulted cardiology and gastroenterology.  1 unit PRBC ordered 11/25.  TRH consulted for admission for further evaluation and management of GI bleed, elevated troponin.   Patient was seen by cardiology, underwent left heart cath 11/26. NSTEMI related to occlusion of a small secondary PDA and there is a focal 80% stenosis in the distal right coronary artery which can be managed medically for now. Patient will follow-up outpatient for TAVR evaluation.  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, petechial mucosa in mid sigmoid colon  Discharge Diagnoses:   Principal Problem:   Lower GI bleed Active Problems:   Depressive disorder   HLD (hyperlipidemia)   Elevated troponin   Severe aortic stenosis   B12 deficiency   NSTEMI (non-ST elevated myocardial infarction) (HCC)   CKD stage 3a, GFR 45-59 ml/min (HCC)   ABLA (acute blood loss anemia)   Discharge Instructions  Discharge Instructions     Call MD for:  difficulty breathing, headache or visual disturbances   Complete by: As directed    Call MD for:  extreme fatigue   Complete by: As directed    Call MD for:  persistant dizziness or light-headedness   Complete by: As directed    Call MD for:  persistant nausea  and vomiting   Complete by: As directed    Call MD for:  severe uncontrolled pain   Complete by: As directed    Call MD for:  temperature >100.4   Complete by: As directed    Diet - low sodium heart healthy   Complete by: As directed    Discharge instructions   Complete  by: As directed    You were cared for by a hospitalist during your hospital stay. If you have any questions about your discharge medications or the care you received while you were in the hospital after you are discharged, you can call the unit and ask to speak with the hospitalist on call if the hospitalist that took care of you is not available. Once you are discharged, your primary care physician will handle any further medical issues. Please note that NO REFILLS for any discharge medications will be authorized once you are discharged, as it is imperative that you return to your primary care physician (or establish a relationship with a primary care physician if you do not have one) for your aftercare needs so that they can reassess your need for medications and monitor your lab values.   Increase activity slowly   Complete by: As directed       Allergies as of 01/10/2023       Reactions   Doxycycline    burning on tongue   Neosporin [bacitracin-polymyxin B] Other (See Comments)   Redness   Penicillins Rash   Has patient had a PCN reaction causing immediate rash, facial/tongue/throat swelling, SOB or lightheadedness with hypotension: No Has patient had a PCN reaction causing severe rash involving mucus membranes or skin necrosis: No Has patient had a PCN reaction that required hospitalization: No Has patient had a PCN reaction occurring within the last 10 years: No If all of the above answers are "NO", then may proceed with Cephalosporin use.        Medication List     STOP taking these medications    megestrol 20 MG tablet Commonly known as: MEGACE       TAKE these medications    aspirin EC 81 MG tablet Take 1 tablet (81 mg total) by mouth daily. Swallow whole. Start taking on: January 11, 2023   budesonide 3 MG 24 hr capsule Commonly known as: ENTOCORT EC Take 6-12 mg by mouth See admin instructions. Take 3 CAPSULES BY MOUTH DAILY FOR 4 WEEKS THEN 2 CAPSULES BY MOUTH  DAILY FOR 2 WEEKS THEN 1 CAPSULE FOR 2 WEEKS   Erleada 60 MG tablet Generic drug: apalutamide Take 240 mg by mouth daily.   fluticasone 50 MCG/ACT nasal spray Commonly known as: FLONASE Place 2 sprays into both nostrils daily as needed for allergies.   iron polysaccharides 150 MG capsule Commonly known as: NIFEREX Take 150 mg by mouth 2 (two) times daily.   Melatonin 10 MG Subl Take 10 mg by mouth at bedtime.   metoprolol succinate 50 MG 24 hr tablet Commonly known as: TOPROL-XL Take 1 tablet (50 mg total) by mouth daily. Take with or immediately following a meal.   ondansetron 8 MG tablet Commonly known as: ZOFRAN Take 1 tablet (8 mg total) by mouth every 8 (eight) hours as needed for nausea or vomiting.   Orgovyx 120 MG tablet Generic drug: relugolix Take 120 mg by mouth daily.   oxybutynin 5 MG tablet Commonly known as: DITROPAN Take 5 mg by mouth 2 (two) times daily as needed.  PARoxetine 20 MG tablet Commonly known as: PAXIL Take 20 mg by mouth every morning.   polyethylene glycol 17 g packet Commonly known as: MIRALAX / GLYCOLAX Take 17 g by mouth daily as needed for mild constipation.   rosuvastatin 20 MG tablet Commonly known as: CRESTOR Take 1 tablet (20 mg total) by mouth daily. What changed:  medication strength how much to take   Vitamin B 12 500 MCG Tabs Take 500 mcg by mouth daily. Start taking on: January 11, 2023        Follow-up Information     Daisy Floro, MD Follow up.   Specialty: Family Medicine Contact information: [address] [place] [address] [phone]         Tonny Bollman, MD Follow up.   Specialty: Cardiology Contact information: [address] [place] [address] [phone]         Vida Rigger, MD Follow up.   Specialty: Gastroenterology Contact information: [address] [place] [address] [phone]                Allergies   Allergen Reactions   Doxycycline     burning on tongue   Neosporin [Bacitracin-Polymyxin B] Other (See Comments)    Redness   Penicillins Rash    Has patient had a PCN reaction causing immediate rash, facial/tongue/throat swelling, SOB or lightheadedness with hypotension: No Has patient had a PCN reaction causing severe rash involving mucus membranes or skin necrosis: No Has patient had a PCN reaction that required hospitalization: No Has patient had a PCN reaction occurring within the last 10 years: No If all of the above answers are "NO", then may proceed with Cephalosporin use.    Consultations: Cardiology  GI   Procedures/Studies: CARDIAC CATHETERIZATION  Result Date: 01/09/2023 Images from the original result were not included. Left Heart Catheterization 01/09/23: Hemodynamic data: LV 159/-8, EDP 14 mmHg.  Able 114/48, mean 76 mmHg.  Peak to peak gradient of 42.9 and mean gradient of 47.29 mmHg consistent with severe aortic stenosis. Angiographic data: LM: Large-caliber vessel.  It is smooth and normal. LAD: Gives origin to moderate sized D1 and D2, after the origin of D1 there is a napkin ring like 40% stenosis.  Otherwise LAD has minimal disease. LCx: Large-caliber vessel, gives origin to 3 marginals, minimal disease is evident.  OM 1 and 2 are large. RCA: Dominant and large.  There is a distal 80% focal stenosis proximal to bifurcation.  PDA has 2 branches, 1 moderate-sized and second small branch which is subtotally occluded and is culprit for NSTEMI.  PL branch is moderate-sized. Impression and recommendations: NSTEMI related to occlusion of a small secondary PDA and there is a focal 80% stenosis in the distal right coronary artery which can be managed medically for now.  Patient stable to proceed with further GI evaluation. Severe aortic valve stenosis as noted above.   ECHOCARDIOGRAM COMPLETE  Result Date: 01/08/2023    ECHOCARDIOGRAM REPORT   Patient Name:   Logan Wolfe  Date of Exam: 01/08/2023 Medical Rec #:  [id number]      Height:       73.0 in Accession #:    [id number]     Weight:       227.1 lb Date of Birth:  1945/06/13      BSA:          2.271 m Patient Age:    77 years  BP:           120/70 mmHg Patient Gender: M              HR:           56 bpm. Exam Location:  Inpatient Procedure: 2D Echo, Color Doppler and Cardiac Doppler Indications:    Aortic stenosis I35.0  History:        Patient has prior history of Echocardiogram examinations, most                 recent 03/05/2022. CHF, Aortic Valve Disease; Risk                 Factors:Diabetes, Dyslipidemia and Current Smoker.  Sonographer:    Dondra Prader RVT RCS Referring Phys: [id number] JONATHAN SEGARS IMPRESSIONS  1. Left ventricular ejection fraction, by estimation, is 60 to 65%. The left ventricle has normal function. The left ventricle has no regional wall motion abnormalities. Left ventricular diastolic parameters are consistent with Grade II diastolic dysfunction (pseudonormalization).  2. Right ventricular systolic function is normal. The right ventricular size is normal. There is moderately elevated pulmonary artery systolic pressure. The estimated right ventricular systolic pressure is 56.0 mmHg.  3. Left atrial size was mildly dilated.  4. The mitral valve is degenerative. Mild mitral valve regurgitation. No evidence of mitral stenosis. Moderate mitral annular calcification.  5. The aortic valve is tricuspid. There is severe calcifcation of the aortic valve. Aortic valve regurgitation is mild to moderate. 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 Aortic valve area, by VTI measures 0.95 cm. Aortic valve  mean gradient measures 29.0 mmHg.  6. The inferior vena cava is dilated in size with <50% respiratory variability, suggesting right atrial pressure of 15 mmHg. FINDINGS  Left Ventricle: Left ventricular ejection fraction, by estimation, is 60 to 65%. The left ventricle has normal function.  The left ventricle has no regional wall motion abnormalities. The left ventricular internal cavity size was normal in size. There is  no left ventricular hypertrophy. Left ventricular diastolic parameters are consistent with Grade II diastolic dysfunction (pseudonormalization). Right Ventricle: The right ventricular size is normal. No increase in right ventricular wall thickness. Right ventricular systolic function is normal. There is moderately elevated pulmonary artery systolic pressure. The tricuspid regurgitant velocity is 3.20 m/s, and with an assumed right atrial pressure of 15 mmHg, the estimated right ventricular systolic pressure is 56.0 mmHg. Left Atrium: Left atrial size was mildly dilated. Right Atrium: Right atrial size was normal in size. Pericardium: There is no evidence of pericardial effusion. Mitral Valve: The mitral valve is degenerative in appearance. There is moderate calcification of the mitral valve leaflet(s). Moderate mitral annular calcification. Mild mitral valve regurgitation. No evidence of mitral valve stenosis. Tricuspid Valve: The tricuspid valve is normal in structure. Tricuspid valve regurgitation is trivial. Aortic Valve: The aortic valve is tricuspid. There is severe calcifcation of the aortic valve. Aortic valve regurgitation is mild to moderate. Aortic regurgitation PHT measures 638 msec. Severe aortic stenosis is present. Aortic valve mean gradient measures 29.0 mmHg. Aortic valve peak gradient measures 43.6 mmHg. Aortic valve area, by VTI measures 0.95 cm. Pulmonic Valve: The pulmonic valve was normal in structure. Pulmonic valve regurgitation is not visualized. Aorta: The aortic root is normal in size and structure. Venous: The inferior vena cava is dilated in size with less than 50% respiratory variability, suggesting right atrial pressure of 15 mmHg. IAS/Shunts: No atrial level shunt detected by color flow Doppler.  LEFT VENTRICLE PLAX 2D LVIDd:         5.10 cm    Diastology LVIDs:         2.90 cm   LV e' medial:    5.57 cm/s LV PW:         1.30 cm   LV E/e' medial:  12.3 LV IVS:        1.00 cm   LV e' lateral:   7.97 cm/s LVOT diam:     1.70 cm   LV E/e' lateral: 8.6 LV SV:         73 LV SV Index:   32 LVOT Area:     2.27 cm  RIGHT VENTRICLE             IVC RV Basal diam:  3.70 cm     IVC diam: 2.10 cm RV S prime:     11.40 cm/s TAPSE (M-mode): 3.2 cm LEFT ATRIUM             Index        RIGHT ATRIUM           Index LA diam:        4.20 cm 1.85 cm/m   RA Area:     15.07 cm LA Vol (A2C):   60.4 ml 26.58 ml/m  RA Volume:   38.40 ml  16.91 ml/m LA Vol (A4C):   73.7 ml 32.45 ml/m LA Biplane Vol: 73.5 ml 32.37 ml/m  AORTIC VALVE                     PULMONIC VALVE AV Area (Vmax):    0.94 cm      PV Vmax:       0.83 m/s AV Area (Vmean):   0.97 cm      PV Peak grad:  2.8 mmHg AV Area (VTI):     0.95 cm AV Vmax:           330.00 cm/s AV Vmean:          226.667 cm/s AV VTI:            0.765 m AV Peak Grad:      43.6 mmHg AV Mean Grad:      29.0 mmHg LVOT Vmax:         137.00 cm/s LVOT Vmean:        97.300 cm/s LVOT VTI:          0.320 m LVOT/AV VTI ratio: 0.42 AI PHT:            638 msec AR Vena Contracta: 0.40 cm  AORTA Ao Root diam: 2.80 cm Ao Asc diam:  3.40 cm MITRAL VALVE               TRICUSPID VALVE MV Area (PHT): 3.91 cm    TR Peak grad:   41.0 mmHg MV Decel Time: 194 msec    TR Vmax:        320.00 cm/s MV E velocity: 68.30 cm/s MV A velocity: 66.90 cm/s  SHUNTS MV E/A ratio:  1.02        Systemic VTI:  0.32 m                            Systemic Diam: 1.70 cm Dalton McleanMD Electronically signed by Wilfred Lacy Signature Date/Time: 01/08/2023/1:09:49 PM    Final    DG Chest Portable 1 View  Result Date: 01/08/2023 CLINICAL DATA:  Shortness of breath and chest pain EXAM: PORTABLE CHEST 1 VIEW COMPARISON:  Radiograph 03/04/2022 FINDINGS: Stable cardiomediastinal silhouette. Aortic atherosclerotic calcification. Diffuse interstitial coarsening greatest in the  mid and lower lungs is new since 03/04/2022. No focal consolidation, pleural effusion, or pneumothorax. No displaced rib fractures. IMPRESSION: Diffuse interstitial coarsening may be due to atypical infection or edema. Electronically Signed   By: Minerva Fester M.D.   On: 01/08/2023 03:17       Discharge Exam: Vitals:   01/10/23 0950 01/10/23 1022  BP: (!) 111/52 116/85  Pulse: 64 (!) 50  Resp: 20 17  Temp:  (!) 97.5 F (36.4 C)  SpO2: 90% 92%    General: Pt is alert, awake, not in acute distress Cardiovascular: RRR, S1/S2 +, no edema Respiratory: CTA bilaterally, no wheezing, no rhonchi, no respiratory distress, no conversational dyspnea  Abdominal: Soft, NT, ND, bowel sounds + Extremities: no edema, no cyanosis Psych: Normal mood and affect, stable judgement and insight     The results of significant diagnostics from this hospitalization (including imaging, microbiology, ancillary and laboratory) are listed below for reference.     Microbiology: No results found for this or any previous visit (from the past 240 hour(s)).   Labs: BNP (last 3 results) Recent Labs    03/04/22 1430 01/08/23 0220  BNP 169.6* 610.0*   Basic Metabolic Panel: Recent Labs  Lab 01/08/23 0224 01/09/23 0346 01/10/23 0327  NA 138 137 140  K 4.1 4.1 4.0  CL 109 107 108  CO2 20* 21* 21*  GLUCOSE 103* 105* 111*  BUN 21 15 14   CREATININE 1.31* 1.25* 1.32*  CALCIUM 9.0 9.0 9.5  MG  --  2.0  --   PHOS  --  3.7  --    Liver Function Tests: Recent Labs  Lab 01/08/23 0245  AST 22  ALT 14  ALKPHOS 50  BILITOT 0.3  PROT 6.3*  ALBUMIN 3.6   No results for input(s): "LIPASE", "AMYLASE" in the last 168 hours. No results for input(s): "AMMONIA" in the last 168 hours. CBC: Recent Labs  Lab 01/08/23 0224 01/08/23 1125 01/08/23 1948 01/09/23 0346 01/09/23 1708 01/10/23 0327  WBC 4.6  --   --  5.6  --  6.5  HGB 7.8* 8.7* 8.5* 8.9* 8.4* 9.8*  HCT 23.5* 27.4* 25.7* 26.8* 24.9* 29.6*   MCV 103.1*  --   --  97.8  --  100.3*  PLT 222  --   --  203  --  210   Cardiac Enzymes: No results for input(s): "CKTOTAL", "CKMB", "CKMBINDEX", "TROPONINI" in the last 168 hours. BNP: Invalid input(s): "POCBNP" CBG: Recent Labs  Lab 01/08/23 0223  GLUCAP 95   D-Dimer No results for input(s): "DDIMER" in the last 72 hours. Hgb A1c Recent Labs    01/08/23 0224  HGBA1C 4.9   Lipid Profile Recent Labs    01/08/23 0224  CHOL 147  HDL 72  LDLCALC 65  TRIG 51  CHOLHDL 2.0   Thyroid function studies Recent Labs    01/08/23 0310  TSH 3.841   Anemia work up Recent Labs    01/08/23 0245 01/08/23 0310  VITAMINB12  --  129*  FOLATE  --  8.5  TIBC 377  --   IRON 55  --    Urinalysis    Component Value Date/Time   COLORURINE YELLOW 10/18/2008 1519   APPEARANCEUR CLOUDY (A) 10/18/2008 1519   LABSPEC 1.024 10/18/2008 1519  PHURINE 7.5 10/18/2008 1519   GLUCOSEU NEGATIVE 10/18/2008 1519   HGBUR NEGATIVE 10/18/2008 1519   BILIRUBINUR NEGATIVE 10/18/2008 1519   KETONESUR NEGATIVE 10/18/2008 1519   PROTEINUR NEGATIVE 10/18/2008 1519   UROBILINOGEN 0.2 10/18/2008 1519   NITRITE NEGATIVE 10/18/2008 1519   LEUKOCYTESUR  10/18/2008 1519    NEGATIVE MICROSCOPIC NOT DONE ON URINES WITH NEGATIVE PROTEIN, BLOOD, LEUKOCYTES, NITRITE, OR GLUCOSE <1000 mg/dL.   Sepsis Labs Recent Labs  Lab 01/08/23 0224 01/09/23 0346 01/10/23 0327  WBC 4.6 5.6 6.5   Microbiology No results found for this or any previous visit (from the past 240 hour(s)).   Patient was seen and examined on the day of discharge and was found to be in stable condition. Time coordinating discharge: 45 minutes including assessment and coordination of care, as well as examination of the patient.   SIGNED:  Noralee Stain, DO [facility] 01/10/2023, 2:46 PM

## 2023-01-10 NOTE — Progress Notes (Signed)
Logan Wolfe 8:44 AM  Subjective: Patient doing well we discussed his prep no new complaints did well with his cath  Objective: Signs stable afebrile no acute distress exam please see preassessment evaluation chemistry stable CBC stable  Assessment: Bright red blood per rectum symptomatic anemia  Plan: To do colonoscopy with anesthesia assistance  [facility]  office [phone] After 5PM or if no answer call [phone]

## 2023-01-10 NOTE — Progress Notes (Addendum)
Patient Name: Logan Wolfe Date of Encounter: 01/10/2023 [facility] Cardiologist: Lance Muss, MD   Interval Summary  .    Back from colonoscopy, up to the bathroom.   Vital Signs .    Vitals:   01/10/23 0750 01/10/23 0945 01/10/23 0950 01/10/23 1022  BP: (!) 153/58 (!) 104/57 (!) 111/52 116/85  Pulse: 60 65 64 (!) 50  Resp: 19 (!) 25 20 17   Temp: (!) 97.3 F (36.3 C) (!) 97 F (36.1 C)  (!) 97.5 F (36.4 C)  TempSrc: Temporal Temporal  Oral  SpO2: 99% 93% 90% 92%  Weight:      Height:        Intake/Output Summary (Last 24 hours) at 01/10/2023 1116 Last data filed at 01/10/2023 0930 Gross per 24 hour  Intake 250 ml  Output --  Net 250 ml      01/10/2023    3:46 AM 01/09/2023    3:49 AM 01/08/2023    2:10 AM  Last 3 Weights  Weight (lbs) 225 lb 9.6 oz 229 lb 12.8 oz 227 lb 1.2 oz  Weight (kg) 102.331 kg 104.237 kg 103 kg      Telemetry/ECG    Sinus Rhythm - Personally Reviewed  Physical Exam .    GEN: No acute distress.   Neck: No JVD Cardiac: RRR, + systolic murmur RUSB, no rubs, or gallops.  Respiratory: Clear to auscultation bilaterally. GI: Soft, nontender, non-distended  MS: No edema  Assessment & Plan .     77 y.o. male with a hx of metastatic prostate CA, moderate AS, HLD, CKD2, CAD, GERD who is being seen 01/08/2023 for the evaluation of shortness of breath at the request of Dolly Rias MD.    Chest pain/Dyspnea on exertion NSTEMI -- reports episodes of intermittent chest discomfort and shortness of breath with exertion which have been worse in the past several months -- EKG showed slight ST depression in lateral leads -- hsTn 1025>>1919>>1921>>1840 -- Echo shows LVEF of 60-65% no rWMA, normal RV -- underwent diagnostic cath yesterday with 80% focal stenosis proximal to bifurcation, and subtotally occluded small PDA branch likely the culprit of NSTEMI. Medical management -- will add ASA 81mg  daily tomorrow, discussed  with GI -- on statin, metoprolol    Acute blood loss anemia 2/2 GIB -- reports intermittent episodes of rectal bleeding over the past couple of months -- Hgb 7.8 on admission, FOBT positive. S/p 1 unit, Hgb up 9.8 --  colonoscopy today with external/internal hemorrhoids, multiple bleeding colonic angioectasias treated with APC -- recs for iron supplement   Severe AS -- echo this admission with progression of aortic stenosis to severe paradoxical low flow/low gradient AS, ASA 0.95, mean gradient . Moderately elevated PASP -- will plan for follow up in the structural heart clinic    Hx of metastatic prostate CA -- follows with [facility], s/p resection/radiation and on hormonal therapy -- management per primary     HLD -- on statin  For questions or updates, please contact [facility] Please consult [email or web address] for contact info under     Signed, Laverda Page, NP   Patient seen, examined. Available data reviewed. Agree with findings, assessment, and plan as outlined by Laverda Page, NP.  The patient is independently interviewed and examined.  His wife is at bedside.  He has returned from colonoscopy and underwent treatment of colonic angioectasias.  He has had no further chest pain or shortness of breath and does not  have resting symptoms.  On my exam today, he is alert, oriented, in no distress.  JVP is normal, lungs are clear bilaterally, heart is regular rate and rhythm with a 3/6 harsh late peaking crescendo decrescendo murmur at the right upper sternal border, abdomen is soft and nontender, extremities have no edema.  I have personally reviewed his cardiac catheterization films which show single-vessel CAD with severe stenosis involving the distal RCA into the PDA branch appropriate for medical therapy.  The left main, LAD, and left circumflex, are all patent with nonobstructive plaquing.  The patient's mean gradient by cardiac catheterization is 47 mmHg  consistent with severe aortic stenosis.  I think this patient will be a good candidate for TAVR as long as his GI bleeding is stabilized.  GI will follow-up with the patient in 2 weeks with a repeat CBC.  As long as that is stable we will continue with TAVR evaluation which will include a gated CTA of the heart as well as a CTA of the chest, abdomen, and pelvis.  Once his CT studies are completed he will be referred for cardiac surgical evaluation as part of multidisciplinary approach to his care.  Our team will take care of scheduling the CT scans in the follow-up.  I will take over as his primary cardiologist and Dr. Hoyle Barr departure.  Please call us if any further questions.  From a cardiac perspective he is stable for discharge.  I would make sure that he can ambulate in the hallways without angina now that his hemoglobin has stabilized.  As long as he is asymptomatic, he is okay for discharge from my perspective.  Tonny Bollman, M.D. 01/10/2023 12:43 PM

## 2023-01-10 NOTE — Op Note (Signed)
[facility] Patient Name: Logan Wolfe Procedure Date : 01/10/2023 MRN: [id number] Attending MD: Vida Rigger , MD, [id number] Date of Birth: Mar 22, 1945 CSN: [id number] Age: 78 Admit Type: Inpatient Procedure:                Colonoscopy Indications:              Last colonoscopy 5 years ago, Hematochezia, Iron                            deficiency anemia secondary to chronic blood loss,                            Microscopic colitis Providers:                Vida Rigger, MD, Stephens Shire RN, RN, Salley Scarlet, Technician, Stephanie Coup, CRNA Referring MD:              Medicines:                Monitored Anesthesia Care Complications:            No immediate complications. Estimated Blood Loss:     Estimated blood loss was minimal. Procedure:                Pre-Anesthesia Assessment:                           - Prior to the procedure, a History and Physical                            was performed, and patient medications and                            allergies were reviewed. The patient's tolerance of                            previous anesthesia was also reviewed. The risks                            and benefits of the procedure and the sedation                            options and risks were discussed with the patient.                            All questions were answered, and informed consent                            was obtained. Prior Anticoagulants: The patient has                            taken no anticoagulant or antiplatelet agents. ASA  Grade Assessment: II - A patient with mild systemic                            disease. After reviewing the risks and benefits,                            the patient was deemed in satisfactory condition to                            undergo the procedure.                           After obtaining informed consent, the colonoscope                            was  passed under direct vision. Throughout the                            procedure, the patient's blood pressure, pulse, and                            oxygen saturations were monitored continuously. The                            CF-HQ190L ([id number]) Olympus coloscope was                            introduced through the anus and advanced to the the                            terminal ileum. The colonoscopy was somewhat                            difficult due to restricted mobility of the colon.                            Successful completion of the procedure was aided by                            withdrawing the scope and replacing with the                            pediatric colonoscope we were easily able to                            advance around the colon to the cecum with some                            abdominal pressure. The patient tolerated the                            procedure well except for some retching in the  middle of the procedure which required lowering the                            sedation and stopping abdominal pressure and                            increasing his oxygen. The quality of the bowel                            preparation was adequate. Scope In: 8:58:14 AM Scope Out: 9:31:31 AM Scope Withdrawal Time: 0 hours 18 minutes 30 seconds  Total Procedure Duration: 0 hours 33 minutes 17 seconds  Findings:      External and internal hemorrhoids were found during retroflexion, during       perianal exam and during digital exam. The hemorrhoids were small.      A few small-mouthed diverticula were found in the sigmoid colon.      Multiple small localized angioectasias with bleeding were found in the       distal rectum. Coagulation for hemostasis using argon plasma at 2       liters/minute and 20 watts was successful.      A segmental area of mildly altered vascular, scarred,       vascular-pattern-decreased and petechial mucosa  was found in the mid and       distal sigmoid colon. Also compatible with radiation damage however       there was no active bleeding seen in this area until after we passed the       scope      The colon (entire examined portion) appeared normal. Biopsies for       histology were taken with a cold forceps from the entire colon for       evaluation of microscopic colitis.      The terminal ileum appeared normal.      The exam was otherwise without abnormality on direct and retroflexion       views. No blood seen anywhere else in the colon as above Impression:               - External and internal hemorrhoids.                           - Diverticulosis in the sigmoid colon.                           - Multiple bleeding colonic angioectasias. Treated                            with argon plasma coagulation (APC).                           - Altered vascular, scarred,                            vascular-pattern-decreased and petechial mucosa in                            the mid sigmoid colon.                           -  The entire examined colon is normal. Biopsied.                           - The examined portion of the ileum was normal.                           - The examination was otherwise normal on direct                            and retroflexion views. Recommendation:           - Soft diet today. Hopefully patient can go home                            later today if no delayed complication and no other                            medical workup needed and moderating his diet                           - Continue present medications. Would add 1 iron                            pill a day over-the-counter consider a trial of                            Canasa suppositories in the future and will decide                            as an outpatient when and if repeat treatment is                            needed                           - Return to GI clinic in 2 weeks recheck  symptoms                            and CBC.                           - Telephone GI clinic for pathology results in 1                            week.                           - Telephone GI clinic if symptomatic PRN. Procedure Code(s):        --- Professional ---                           [phone], 59, Colonoscopy, flexible; with control of                            bleeding, any  method                           [id number], Colonoscopy, flexible; with biopsy, single                            or multiple Diagnosis Code(s):        --- Professional ---                           K55.21, Angiodysplasia of colon with hemorrhage                           K63.89, Other specified diseases of intestine                           K92.1, Melena (includes Hematochezia)                           D50.0, Iron deficiency anemia secondary to blood                            loss (chronic)                           K52.839, Microscopic colitis, unspecified                           K57.30, Diverticulosis of large intestine without                            perforation or abscess without bleeding CPT copyright 2022 American Medical Association. All rights reserved. The codes documented in this report are preliminary and upon coder review may  be revised to meet current compliance requirements. Vida Rigger, MD 01/10/2023 9:57:14 AM This report has been signed electronically. Number of Addenda: 0

## 2023-01-11 LAB — LIPOPROTEIN A (LPA): Lipoprotein (a): 235 nmol/L — ABNORMAL HIGH (ref <75.0)

## 2023-01-12 ENCOUNTER — Encounter (HOSPITAL_COMMUNITY): Payer: Self-pay | Admitting: Gastroenterology

## 2023-01-12 LAB — SURGICAL PATHOLOGY

## 2023-01-24 DIAGNOSIS — D500 Iron deficiency anemia secondary to blood loss (chronic): Secondary | ICD-10-CM | POA: Diagnosis not present

## 2023-01-25 DIAGNOSIS — I213 ST elevation (STEMI) myocardial infarction of unspecified site: Secondary | ICD-10-CM | POA: Diagnosis not present

## 2023-01-25 DIAGNOSIS — K922 Gastrointestinal hemorrhage, unspecified: Secondary | ICD-10-CM | POA: Diagnosis not present

## 2023-01-25 DIAGNOSIS — Z09 Encounter for follow-up examination after completed treatment for conditions other than malignant neoplasm: Secondary | ICD-10-CM | POA: Diagnosis not present

## 2023-01-25 DIAGNOSIS — D649 Anemia, unspecified: Secondary | ICD-10-CM | POA: Diagnosis not present

## 2023-01-25 DIAGNOSIS — R198 Other specified symptoms and signs involving the digestive system and abdomen: Secondary | ICD-10-CM | POA: Diagnosis not present

## 2023-01-25 DIAGNOSIS — Z6830 Body mass index (BMI) 30.0-30.9, adult: Secondary | ICD-10-CM | POA: Diagnosis not present

## 2023-01-29 ENCOUNTER — Ambulatory Visit: Payer: HMO

## 2023-01-29 DIAGNOSIS — E782 Mixed hyperlipidemia: Secondary | ICD-10-CM

## 2023-01-29 LAB — HEPATIC FUNCTION PANEL
ALT: 10 [IU]/L (ref 0–44)
AST: 17 [IU]/L (ref 0–40)
Albumin: 4.2 g/dL (ref 3.8–4.8)
Alkaline Phosphatase: 65 [IU]/L (ref 44–121)
Bilirubin Total: <0.2 mg/dL (ref 0.0–1.2)
Bilirubin, Direct: 0.09 mg/dL (ref 0.00–0.40)
Total Protein: 6.2 g/dL (ref 6.0–8.5)

## 2023-01-29 LAB — LIPID PANEL
Chol/HDL Ratio: 2.1 {ratio} (ref 0.0–5.0)
Cholesterol, Total: 154 mg/dL (ref 100–199)
HDL: 75 mg/dL (ref >39)
LDL Chol Calc (NIH): 61 mg/dL (ref 0–99)
Triglycerides: 103 mg/dL (ref 0–149)
VLDL Cholesterol Cal: 18 mg/dL (ref 5–40)

## 2023-01-30 NOTE — Progress Notes (Signed)
[facility]   MULTIDISCIPLINARY HEART VALVE CLINIC                                     Cardiology Office Note:    Date:  02/02/2023   ID:  AYTHAN KYLLO, DOB 06-14-1945, MRN [id number]  PCP:  Daisy Floro, MD  [facility] Cardiologist:  Tonny Bollman, MD  [facility] Electrophysiologist:  None   Referring MD: Daisy Floro, MD   Post hospital follow up- discuss TAVR work up   History of Present Illness:    Logan Wolfe is a 77 y.o. male with a hx of metastatic prostate cancer s/p resection/XRT/hormonal therapy, pulmonary hypertension, CAD, SVT, CKD stage II, HLD, GERD, anemia, recent GI bleed and severe aortic stenosis who presents to clinic for follow up.   He was admitted 11/25-11/27/24 for NSTEMI in the setting of acute GI bleed. HStop peaked at 1921. Cath 11/26 showed an 80% focal stenosis proximal to bifurcation, and subtotally occluded small PDA branch likely the culprit of NSTEMI. Medical management was recommended. He required transfusion for acute anemia (hg down to 7.8) related to GI bleeding. Colonoscopy 11/27 with external/internal hemorrhoids, multiple bleeding colonic angioectasias treated with APC. 2D echo showed EF 60-65% with severe paradoxical LFLG AS with a mean gradient of 29 mm hg, AVA 0.95 cm2, mild to mod AI, mod MAC with mild MR, and moderate pulm htn. Aortic valve interrogation on cath showed a mean gradient of 47 mm hg. He was discharged home with plans for outpatient TAVR work up.  He had a CBC with Dr. Tenny Craw on 12/11 that showed a hemoglobin of 9.4 (stable from 9.8 at discharge). We requested this be faxed to our office.   Today the patient presents to clinic for follow up. Has an office upstairs and goes up and down the steps. Occasionally has to catch his breath. Occasionally gets chest pain with exertion. It eases off with rest and he has not had to take any SL NTG. No LE edema, orthopnea or PND. No dizziness or syncope. He has  had a few epsides of blood in stool. No palpitations. He sees Dr. Claretha Cooper with GI in the [facility]. He has had regular dental work with no ongoing issues.     Past Medical History:  Diagnosis Date   Allergic rhinitis, cause unspecified    Anxiety    Aortic valve calcification    Cancer (HCC)    Prostate CA, Dr Annabell Howells 22 years ago   Constipation    Depressive disorder    Dyspepsia and other specified disorders of function of stomach    GERD (gastroesophageal reflux disease)    Hypoglycemia    Memory loss    Otitis    externa of right ear   Prostate cancer (HCC)    Spinal stenosis    Unspecified pruritic disorder      Current Medications: Current Meds  Medication Sig   apalutamide (ERLEADA) 60 MG tablet Take 240 mg by mouth daily.   aspirin EC 81 MG tablet Take 1 tablet (81 mg total) by mouth daily. Swallow whole.   budesonide (ENTOCORT EC) 3 MG 24 hr capsule Take 6-12 mg by mouth See admin instructions. Take 3 CAPSULES BY MOUTH DAILY FOR 4 WEEKS THEN 2 CAPSULES BY MOUTH DAILY FOR 2 WEEKS THEN 1 CAPSULE FOR 2 WEEKS   calcium carbonate (SUPER CALCIUM) 1500 (  600 Ca) MG TABS tablet Take 600 mg of elemental calcium by mouth daily with breakfast. Pt takes 2 tablets once a day.   cyanocobalamin 500 MCG TABS Take 500 mcg by mouth daily.   fluticasone (FLONASE) 50 MCG/ACT nasal spray Place 2 sprays into both nostrils daily as needed for allergies.   Ibuprofen 200 MG CAPS Take 200 mg by mouth as needed.   iron polysaccharides (NIFEREX) 150 MG capsule Take 150 mg by mouth 2 (two) times daily.   Melatonin 10 MG SUBL Take 10 mg by mouth at bedtime.   metoprolol succinate (TOPROL-XL) 50 MG 24 hr tablet Take 1 tablet (50 mg total) by mouth daily. Take with or immediately following a meal.   ondansetron (ZOFRAN) 8 MG tablet Take 1 tablet (8 mg total) by mouth every 8 (eight) hours as needed for nausea or vomiting.   oxybutynin (DITROPAN) 5 MG tablet Take 5 mg by mouth 2 (two) times daily as needed.    PARoxetine (PAXIL) 20 MG tablet Take 20 mg by mouth every morning.   polyethylene glycol (MIRALAX / GLYCOLAX) 17 g packet Take 17 g by mouth daily as needed for mild constipation.   relugolix (ORGOVYX) 120 MG tablet Take 120 mg by mouth daily.   rosuvastatin (CRESTOR) 20 MG tablet Take 1 tablet (20 mg total) by mouth daily.      ROS:   Please see the history of present illness.    All other systems reviewed and are negative.  EKGs       Risk Assessment/Calculations:           Physical Exam:    VS:  BP (!) 110/52   Pulse (!) 55   Ht 6\' 1"  (1.854 m)   Wt 233 lb (105.7 kg)   SpO2 97%   BMI 30.74 kg/m     Wt Readings from Last 3 Encounters:  02/02/23 233 lb (105.7 kg)  01/10/23 225 lb 9.6 oz (102.3 kg)  11/06/22 227 lb (103 kg)     GEN: Well nourished, well developed in no acute distress NECK: No JVD CARDIAC: RRR, 4/6 harsh SEM heard best at LUSB. No rubs, gallops RESPIRATORY:  Clear to auscultation without rales, wheezing or rhonchi  ABDOMEN: Soft, non-tender, non-distended EXTREMITIES:  No edema; No deformity.    ASSESSMENT:    1. Severe aortic stenosis   2. Coronary artery disease of native artery of native heart with stable angina pectoris (HCC)   3. Lower GI bleed   4. 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HCC)     PLAN:    In order of problems listed above:  Severe aortic stenosis: pLFLG AS by echo but mean AoV 47 mm hg by cath. I have set up his appts for pre TAVR scans and Dr. Leafy Ro with TCTS as a part of a multidisciplinary approach to his care. NYHA class II symptoms. BMET, CBC today  CAD: recent NSTEMI with peak HS trop 1921. Cath 01/09/23 with 80% focal stenosis proximal to bifurcation, and subtotally occluded small PDA branch likely the culprit of NSTEMI. Plan medical management. Continue ASA 81mg  daily. Continue Toprol XL 50mg  daily and Crestor 20mg  daily.    Acute blood loss anemia 2/2 GIB: recent colonoscopy with external/internal  hemorrhoids, multiple bleeding colonic angioectasias treated with APC. CBC stable ~9.4 on 12/11. He has had some more recent blood in his stool so will need to watch Hg closely. He has follow up with GI in early January. Check CBC today.   Hx  of metastatic prostate CA: follows with [facility], s/p resection/radiation and on hormonal therapy.     Medication Adjustments/Labs and Tests Ordered: Current medicines are reviewed at length with the patient today.  Concerns regarding medicines are outlined above.  Orders Placed This Encounter  Procedures   CT ANGIO ABDOMEN PELVIS  W & WO CONTRAST   CT ANGIO CHEST AORTA W/CM & OR WO/CM   CT CORONARY MORPH W/CTA COR W/SCORE W/CA W/CM &/OR WO/CM   Basic metabolic panel   CBC   No orders of the defined types were placed in this encounter.   Patient Instructions  Medication Instructions:  Your physician recommends that you continue on your current medications as directed. Please refer to the Current Medication list given to you today.  *If you need a refill on your cardiac medications before your next appointment, please call your pharmacy*   Lab Work: TODAY: BMET, CBC  If you have labs (blood work) drawn today and your tests are completely normal, you will receive your results only by: MyChart Message (if you have MyChart) OR A paper copy in the mail If you have any lab test that is abnormal or we need to change your treatment, we will call you to review the results.   Testing/Procedures: SEE INSTRUCTION LETTER    Follow-Up: At [facility], you and your health needs are our priority.  As part of our continuing mission to provide you with exceptional heart care, we have created designated Provider Care Teams.  These Care Teams include your primary Cardiologist (physician) and Advanced Practice Providers (APPs -  Physician Assistants and Nurse Practitioners) who all work together to provide you with the care you need, when you  need it.  We recommend signing up for the patient portal called "MyChart".  Sign up information is provided on this After Visit Summary.  MyChart is used to connect with patients for Virtual Visits (Telemedicine).  Patients are able to view lab/test results, encounter notes, upcoming appointments, etc.  Non-urgent messages can be sent to your provider as well.   To learn more about what you can do with MyChart, go to [email or web address].    Your next appointment:   KEEP SCHEDULED FOLLOW-UP        Signed, Cline Crock, PA-C  02/02/2023 4:18 PM    [facility]

## 2023-02-02 ENCOUNTER — Ambulatory Visit: Payer: HMO | Attending: Physician Assistant | Admitting: Physician Assistant

## 2023-02-02 ENCOUNTER — Encounter: Payer: Self-pay | Admitting: Physician Assistant

## 2023-02-02 VITALS — BP 110/52 | HR 55 | Ht 73.0 in | Wt 233.0 lb

## 2023-02-02 DIAGNOSIS — I350 Nonrheumatic aortic (valve) stenosis: Secondary | ICD-10-CM | POA: Diagnosis not present

## 2023-02-02 DIAGNOSIS — I25118 Atherosclerotic heart disease of native coronary artery with other forms of angina pectoris: Secondary | ICD-10-CM

## 2023-02-02 DIAGNOSIS — I2510 Atherosclerotic heart disease of native coronary artery without angina pectoris: Secondary | ICD-10-CM | POA: Diagnosis not present

## 2023-02-02 DIAGNOSIS — C61 Malignant neoplasm of prostate: Secondary | ICD-10-CM

## 2023-02-02 DIAGNOSIS — K922 Gastrointestinal hemorrhage, unspecified: Secondary | ICD-10-CM | POA: Diagnosis not present

## 2023-02-02 LAB — CBC

## 2023-02-02 NOTE — Patient Instructions (Signed)
Medication Instructions:  Your physician recommends that you continue on your current medications as directed. Please refer to the Current Medication list given to you today.  *If you need a refill on your cardiac medications before your next appointment, please call your pharmacy*   Lab Work: TODAY: BMET, CBC If you have labs (blood work) drawn today and your tests are completely normal, you will receive your results only by: [place] (if you have MyChart) OR A paper copy in the mail If you have any lab test that is abnormal or we need to change your treatment, we will call you to review the results.   Testing/Procedures: SEE INSTRUCTION LETTER   Follow-Up: At [facility], you and your health needs are our priority.  As part of our continuing mission to provide you with exceptional heart care, we have created designated Provider Care Teams.  These Care Teams include your primary Cardiologist (physician) and Advanced Practice Providers (APPs -  Physician Assistants and Nurse Practitioners) who all work together to provide you with the care you need, when you need it.  We recommend signing up for the patient portal called "MyChart".  Sign up information is provided on this After Visit Summary.  MyChart is used to connect with patients for Virtual Visits (Telemedicine).  Patients are able to view lab/test results, encounter notes, upcoming appointments, etc.  Non-urgent messages can be sent to your provider as well.   To learn more about what you can do with MyChart, go to [email or web address].    Your next appointment:   KEEP SCHEDULED FOLLOW-UP

## 2023-02-02 NOTE — Progress Notes (Signed)
Pre Surgical Assessment: 5 M Walk Test  28M=16.18ft  5 Meter Walk Test- trial 1:6.62 seconds 5 Meter Walk Test- trial 2:5.89 seconds 5 Meter Walk Test- trial 3: 6.58 seconds 5 Meter Walk Test Average: 6.36 seconds

## 2023-02-03 LAB — CBC
Hematocrit: 28.6 % — ABNORMAL LOW (ref 37.5–51.0)
Hemoglobin: 9.8 g/dL — ABNORMAL LOW (ref 13.0–17.7)
MCH: 34 pg — ABNORMAL HIGH (ref 26.6–33.0)
MCHC: 34.3 g/dL (ref 31.5–35.7)
MCV: 99 fL — ABNORMAL HIGH (ref 79–97)
Platelets: 226 10*3/uL (ref 150–450)
RBC: 2.88 x10E6/uL — ABNORMAL LOW (ref 4.14–5.80)
RDW: 13 % (ref 11.6–15.4)
WBC: 4.6 10*3/uL (ref 3.4–10.8)

## 2023-02-03 LAB — BASIC METABOLIC PANEL
BUN/Creatinine Ratio: 16 (ref 10–24)
BUN: 20 mg/dL (ref 8–27)
CO2: 22 mmol/L (ref 20–29)
Calcium: 9.3 mg/dL (ref 8.6–10.2)
Chloride: 104 mmol/L (ref 96–106)
Creatinine, Ser: 1.27 mg/dL (ref 0.76–1.27)
Glucose: 99 mg/dL (ref 70–99)
Potassium: 4.8 mmol/L (ref 3.5–5.2)
Sodium: 140 mmol/L (ref 134–144)
eGFR: 57 mL/min/{1.73_m2} — ABNORMAL LOW (ref >59)

## 2023-02-16 ENCOUNTER — Other Ambulatory Visit (HOSPITAL_COMMUNITY): Payer: HMO

## 2023-02-19 ENCOUNTER — Ambulatory Visit (HOSPITAL_COMMUNITY): Payer: HMO

## 2023-02-22 ENCOUNTER — Ambulatory Visit (HOSPITAL_COMMUNITY)
Admission: RE | Admit: 2023-02-22 | Discharge: 2023-02-22 | Disposition: A | Discharge status: Home / Self Care | Payer: HMO | Source: Ambulatory Visit | Attending: Physician Assistant | Admitting: Physician Assistant

## 2023-02-22 DIAGNOSIS — D500 Iron deficiency anemia secondary to blood loss (chronic): Secondary | ICD-10-CM | POA: Diagnosis not present

## 2023-02-22 DIAGNOSIS — I350 Nonrheumatic aortic (valve) stenosis: Secondary | ICD-10-CM | POA: Insufficient documentation

## 2023-02-22 MED ORDER — IOHEXOL 350 MG/ML SOLN
100.0000 mL | Freq: Once | INTRAVENOUS | Status: AC | PRN
  Administered 2023-02-22: 100 mL via INTRAVENOUS

## 2023-02-28 NOTE — Progress Notes (Signed)
[address]       [place] [address]             [phone]           LINO KLUTZ [facility] Medical Record #[id number] Date of Birth: 1945-06-30  Tonny Bollman, MD Daisy Floro, MD  Chief Complaint:   aortic stenosis  History of Present Illness:     Pt is a very pleasant 78 yo male with known AS who also has stage 4 metastatic prostate cancer who has a prognosis over a year and is well managed with hormonal therapy currently. He was treated for SVT last year and serial echos have shown progression of his AS. He was admitted in November with GI bleed and NSTEMI. Work up then revealed paradoxical low gradient severe AS and EF of 55%. Pt at cath with mean gradient over and with PDA stenosis felt best served with medical therapy. Pt currently with DOE with climbing stairs and no CP. He is currently fighting a cold and feels like he should see his PCP if no better in a few days      Past Medical History:  Diagnosis Date   Allergic rhinitis, cause unspecified    Anxiety    Aortic valve calcification    Cancer (HCC)    Prostate CA, Dr Annabell Howells 22 years ago   Constipation    Depressive disorder    Dyspepsia and other specified disorders of function of stomach    GERD (gastroesophageal reflux disease)    Hypoglycemia    Memory loss    Otitis    externa of right ear   Prostate cancer (HCC)    Spinal stenosis    Unspecified pruritic disorder     Past Surgical History:  Procedure Laterality Date   BIOPSY  01/10/2023   Procedure: BIOPSY;  Surgeon: Vida Rigger, MD;  Location: [facility] ENDOSCOPY;  Service: Gastroenterology;;   COLONOSCOPY WITH PROPOFOL N/A 01/10/2023   Procedure: COLONOSCOPY WITH PROPOFOL;  Surgeon: Vida Rigger, MD;  Location: [facility] ENDOSCOPY;  Service: Gastroenterology;  Laterality: N/A;   HOT HEMOSTASIS N/A 01/10/2023   Procedure: HOT HEMOSTASIS ARGON PLASMA COAGULATION;  Surgeon: Vida Rigger, MD;  Location: [facility] ENDOSCOPY;  Service:  Gastroenterology;  Laterality: N/A;   LEFT HEART CATH AND CORONARY ANGIOGRAPHY N/A 01/09/2023   Procedure: LEFT HEART CATH AND CORONARY ANGIOGRAPHY;  Surgeon: Yates Decamp, MD;  Location: [facility] INVASIVE CV LAB;  Service: Cardiovascular;  Laterality: N/A;   PROSTATE SURGERY      Social History   Tobacco Use  Smoking Status Light Smoker   Types: Cigars  Smokeless Tobacco Never  Tobacco Comments   Smoke a cigar when I play golf    Social History   Substance and Sexual Activity  Alcohol Use Yes   Alcohol/week: 1.0 standard drink of alcohol   Types: 1 Shots of liquor per week   Comment: a jack and ginger daily at 5 pom    Social History   Socioeconomic History   Marital status: Married    Spouse name: [name]   Number of children: 2   Years of education: 13   Highest education level: Not on file  Occupational History    Employer: RETIRED  Tobacco Use   Smoking status: Light Smoker    Types: Cigars   Smokeless tobacco: Never   Tobacco comments:    Smoke a cigar when I play golf  Vaping Use   Vaping status: Never  Used  Substance and Sexual Activity   Alcohol use: Yes    Alcohol/week: 1.0 standard drink of alcohol    Types: 1 Shots of liquor per week    Comment: a jack and ginger daily at 5 pom   Drug use: No   Sexual activity: Not Currently    Partners: Female  Other Topics Concern   Not on file  Social History Narrative   Patient is married [name]) and lives with his wife.   Patient has 2 children and his wife has 2 children.   Patient is semi-retired.   Patient has a college education.   Patient is right-handed.   Patient drinks one cup of coffee daily.   Social Drivers of Corporate investment banker Strain: Not on file  Food Insecurity: No Food Insecurity (01/08/2023)   Hunger Vital Sign    Worried About Running Out of Food in the Last Year: Never true    Ran Out of Food in the Last Year: Never true  Transportation Needs: No Transportation Needs (01/08/2023)    PRAPARE - Administrator, Civil Service (Medical): No    Lack of Transportation (Non-Medical): No  Physical Activity: Not on file  Stress: Not on file  Social Connections: Not on file  Intimate Partner Violence: Not At Risk (01/08/2023)   Humiliation, Afraid, Rape, and Kick questionnaire    Fear of Current or Ex-Partner: No    Emotionally Abused: No    Physically Abused: No    Sexually Abused: No    Allergies  Allergen Reactions   Doxycycline     burning on tongue   Neosporin [Bacitracin-Polymyxin B] Other (See Comments)    Redness   Penicillins Rash    Has patient had a PCN reaction causing immediate rash, facial/tongue/throat swelling, SOB or lightheadedness with hypotension: No Has patient had a PCN reaction causing severe rash involving mucus membranes or skin necrosis: No Has patient had a PCN reaction that required hospitalization: No Has patient had a PCN reaction occurring within the last 10 years: No If all of the above answers are "NO", then may proceed with Cephalosporin use.    Current Outpatient Medications  Medication Sig Dispense Refill   apalutamide (ERLEADA) 60 MG tablet Take 240 mg by mouth daily.     budesonide (ENTOCORT EC) 3 MG 24 hr capsule Take 6-12 mg by mouth See admin instructions. Take 3 CAPSULES BY MOUTH DAILY FOR 4 WEEKS THEN 2 CAPSULES BY MOUTH DAILY FOR 2 WEEKS THEN 1 CAPSULE FOR 2 WEEKS     calcium carbonate (SUPER CALCIUM) 1500 (600 Ca) MG TABS tablet Take 600 mg of elemental calcium by mouth daily with breakfast. Pt takes 2 tablets once a day.     fluticasone (FLONASE) 50 MCG/ACT nasal spray Place 2 sprays into both nostrils daily as needed for allergies.     Ibuprofen 200 MG CAPS Take 200 mg by mouth as needed.     iron polysaccharides (NIFEREX) 150 MG capsule Take 150 mg by mouth 2 (two) times daily.     Melatonin 10 MG SUBL Take 10 mg by mouth at bedtime.     metoprolol succinate (TOPROL-XL) 50 MG 24 hr tablet Take 1 tablet (50 mg  total) by mouth daily. Take with or immediately following a meal. 90 tablet 3   ondansetron (ZOFRAN) 8 MG tablet Take 1 tablet (8 mg total) by mouth every 8 (eight) hours as needed for nausea or vomiting. 20 tablet 0  oxybutynin (DITROPAN) 5 MG tablet Take 5 mg by mouth 2 (two) times daily as needed.     PARoxetine (PAXIL) 20 MG tablet Take 20 mg by mouth every morning.     polyethylene glycol (MIRALAX / GLYCOLAX) 17 g packet Take 17 g by mouth daily as needed for mild constipation.     relugolix (ORGOVYX) 120 MG tablet Take 120 mg by mouth daily.     rosuvastatin (CRESTOR) 20 MG tablet Take 1 tablet (20 mg total) by mouth daily. 30 tablet 0   No current facility-administered medications for this visit.     Family History  Problem Relation Age of Onset   Hyperlipidemia Mother    Diabetes Mother    Skin cancer Father        part of right ear removed   Prostate cancer Brother    Breast cancer Neg Hx    Colon cancer Neg Hx    Pancreatic cancer Neg Hx        Physical Exam: Lungs: overall clear Card: RR with harsh systolic murmur Ext: Warm Neuro: alert      Diagnostic Studies & Laboratory data: I have personally reviewed the following studies and agree with the findings   TTE (12/2022) IMPRESSIONS     1. Left ventricular ejection fraction, by estimation, is 60 to 65%. The  left ventricle has normal function. The left ventricle has no regional  wall motion abnormalities. Left ventricular diastolic parameters are  consistent with Grade II diastolic  dysfunction (pseudonormalization).   2. Right ventricular systolic function is normal. The right ventricular  size is normal. There is moderately elevated pulmonary artery systolic  pressure. The estimated right ventricular systolic pressure is 56.0 mmHg.   3. Left atrial size was mildly dilated.   4. The mitral valve is degenerative. Mild mitral valve regurgitation. No  evidence of mitral stenosis. Moderate mitral annular  calcification.   5. The aortic valve is tricuspid. There is severe calcifcation of the  aortic valve. Aortic valve regurgitation is mild to moderate. Severe  paradoxical low flow/low gradient aortic valve stenosis. Aortic valve  area, by VTI measures 0.95 cm. Aortic valve   mean gradient measures 29.0 mmHg.   6. The inferior vena cava is dilated in size with <50% respiratory  variability, suggesting right atrial pressure of 15 mmHg.   FINDINGS   Left Ventricle: Left ventricular ejection fraction, by estimation, is 60  to 65%. The left ventricle has normal function. The left ventricle has no  regional wall motion abnormalities. The left ventricular internal cavity  size was normal in size. There is   no left ventricular hypertrophy. Left ventricular diastolic parameters  are consistent with Grade II diastolic dysfunction (pseudonormalization).   Right Ventricle: The right ventricular size is normal. No increase in  right ventricular wall thickness. Right ventricular systolic function is  normal. There is moderately elevated pulmonary artery systolic pressure.  The tricuspid regurgitant velocity is  3.20 m/s, and with an assumed right atrial pressure of 15 mmHg, the  estimated right ventricular systolic pressure is 56.0 mmHg.   Left Atrium: Left atrial size was mildly dilated.   Right Atrium: Right atrial size was normal in size.   Pericardium: There is no evidence of pericardial effusion.   Mitral Valve: The mitral valve is degenerative in appearance. There is  moderate calcification of the mitral valve leaflet(s). Moderate mitral  annular calcification. Mild mitral valve regurgitation. No evidence of  mitral valve stenosis.   Tricuspid Valve:  The tricuspid valve is normal in structure. Tricuspid  valve regurgitation is trivial.   Aortic Valve: The aortic valve is tricuspid. There is severe calcifcation  of the aortic valve. Aortic valve regurgitation is mild to moderate.   Aortic regurgitation PHT measures 638 msec. Severe aortic stenosis is  present. Aortic valve mean gradient  measures 29.0 mmHg. Aortic valve peak gradient measures 43.6 mmHg. Aortic  valve area, by VTI measures 0.95 cm.   Pulmonic Valve: The pulmonic valve was normal in structure. Pulmonic valve  regurgitation is not visualized.   Aorta: The aortic root is normal in size and structure.   Venous: The inferior vena cava is dilated in size with less than 50%  respiratory variability, suggesting right atrial pressure of 15 mmHg.   IAS/Shunts: No atrial level shunt detected by color flow Doppler.     LEFT VENTRICLE  PLAX 2D  LVIDd:         5.10 cm   Diastology  LVIDs:         2.90 cm   LV e' medial:    5.57 cm/s  LV PW:         1.30 cm   LV E/e' medial:  12.3  LV IVS:        1.00 cm   LV e' lateral:   7.97 cm/s  LVOT diam:     1.70 cm   LV E/e' lateral: 8.6  LV SV:         73  LV SV Index:   32  LVOT Area:     2.27 cm     RIGHT VENTRICLE             IVC  RV Basal diam:  3.70 cm     IVC diam: 2.10 cm  RV S prime:     11.40 cm/s  TAPSE (M-mode): 3.2 cm   LEFT ATRIUM             Index        RIGHT ATRIUM           Index  LA diam:        4.20 cm 1.85 cm/m   RA Area:     15.07 cm  LA Vol (A2C):   60.4 ml 26.58 ml/m  RA Volume:   38.40 ml  16.91 ml/m  LA Vol (A4C):   73.7 ml 32.45 ml/m  LA Biplane Vol: 73.5 ml 32.37 ml/m   AORTIC VALVE                     PULMONIC VALVE  AV Area (Vmax):    0.94 cm      PV Vmax:       0.83 m/s  AV Area (Vmean):   0.97 cm      PV Peak grad:  2.8 mmHg  AV Area (VTI):     0.95 cm  AV Vmax:           330.00 cm/s  AV Vmean:          226.667 cm/s  AV VTI:            0.765 m  AV Peak Grad:      43.6 mmHg  AV Mean Grad:      29.0 mmHg  LVOT Vmax:         137.00 cm/s  LVOT Vmean:        97.300 cm/s  LVOT VTI:  0.320 m  LVOT/AV VTI ratio: 0.42  AI PHT:            638 msec  AR Vena Contracta: 0.40 cm    AORTA  Ao Root diam: 2.80  cm  Ao Asc diam:  3.40 cm   MITRAL VALVE               TRICUSPID VALVE  MV Area (PHT): 3.91 cm    TR Peak grad:   41.0 mmHg  MV Decel Time: 194 msec    TR Vmax:        320.00 cm/s  MV E velocity: 68.30 cm/s  MV A velocity: 66.90 cm/s  SHUNTS  MV E/A ratio:  1.02        Systemic VTI:  0.32 m                             Systemic Diam: 1.70 cm   CATH (12/2022) Conclusion  Left Heart Catheterization 01/09/23: Hemodynamic data: LV 159/-8, EDP 14 mmHg.  Able 114/48, mean 76 mmHg.  Peak to peak gradient of 42.9 and mean gradient of 47.29 mmHg consistent with severe aortic stenosis.   Angiographic data: LM: Large-caliber vessel.  It is smooth and normal. LAD: Gives origin to moderate sized D1 and D2, after the origin of D1 there is a napkin ring like 40% stenosis.  Otherwise LAD has minimal disease. LCx: Large-caliber vessel, gives origin to 3 marginals, minimal disease is evident.  OM 1 and 2 are large. RCA: Dominant and large.  There is a distal 80% focal stenosis proximal to bifurcation.  PDA has 2 branches, 1 moderate-sized and second small branch which is subtotally occluded and is culprit for NSTEMI.  PL branch is moderate-sized.      Impression and recommendations: NSTEMI related to occlusion of a small secondary PDA and there is a focal 80% stenosis in the distal right coronary artery which can be managed medically for now.  Patient stable to proceed with further GI evaluation. Severe aortic valve stenosis as noted above.    Recent Radiology Findings:   CTA  (02/2023) FINDINGS: Aortic Valve:   Tricuspid aortic valve with severely reduced cusp excursion. Severely thickened and severely calcified aortic valve cusps.   AV calcium score: 3733   Virtual Basal Annulus Measurements:   Maximum/Minimum Diameter: 28.3 x 22.4 mm   Perimeter: 78.6 mm   Area:  473 mm2   Focal LVOT calcifications under LCC.   Membranous septal length: 6.5 mm.   Based on these measurements, the  annulus would be suitable for a 26 mm Sapien 3 valve. Alternatively, Heart Team can consider 29 mm Evolut valve. Recommend Heart Team discussion for valve selection.   Sinus of Valsalva Measurements:   Non-coronary:  32 mm   Right - coronary:  31 mm   Left - coronary:  32 mm   Sinus of Valsalva Height:   Left: 24 mm   Right: 23 mm   Aorta: Conventional 3 vessel branch pattern of aortic arch.   Sinotubular Junction:  29 mm   Ascending Thoracic Aorta:  33 mm   Aortic Arch:  27 mm   Proximal descending aorta is mildly dilated 37 mm   Descending Thoracic Aorta:  27 mm   Coronary Artery Height above Annulus:   Left main: 18.3 mm   Right coronary: 16.6 mm   Coronary Arteries: Normal coronary origin. Right dominance. The study was performed  without use of NTG and insufficient for plaque evaluation.   Optimum Fluoroscopic Angle for Delivery: LAO 6 CAU 6   OTHER:   Atria: Mild biatrial dilation.   Left atrial appendage: No thrombus.   Mitral valve: Grossly normal, mild mitral annular calcifications.   Pulmonary artery: Normal caliber.   Pulmonary veins: Normal anatomy.   IMPRESSION: 1. Tricuspid aortic valve with severely reduced cusp excursion. Severely thickened and severely calcified aortic valve cusps. 2. Aortic valve calcium score: 3733 3. Annulus area: 473 mm2, suitable for 26 mm Sapien 3 valve. Focal LVOT calcifications under LCC. Membranous septal length 6.5 mm. 4. Sufficient coronary artery heights from annulus. 5. Optimum fluoroscopic angle for delivery: LAO 6 CAU 6        Recent Lab Findings: Lab Results  Component Value Date   WBC 4.6 02/02/2023   HGB 9.8 (L) 02/02/2023   HCT 28.6 (L) 02/02/2023   PLT 226 02/02/2023   GLUCOSE 99 02/02/2023   CHOL 154 01/29/2023   TRIG 103 01/29/2023   HDL 75 01/29/2023   LDLCALC 61 01/29/2023   ALT 10 01/29/2023   AST 17 01/29/2023   NA 140 02/02/2023   K 4.8 02/02/2023   CL 104 02/02/2023    CREATININE 1.27 02/02/2023   BUN 20 02/02/2023   CO2 22 02/02/2023   TSH 3.841 01/08/2023   INR 1.1 01/09/2023   HGBA1C 4.9 01/08/2023      Assessment / Plan:     78 yo male with NYHA class 2 symptoms of severe AS and slightly depressed LV function, CAD, stage 4 metastatic prostate cancer, chronic GI bleed and has acceptable anatomy for femoral access TAVR with a  26mm sapien valve. We discussed all the risks and gaols and recovery from TAVR and he understands and wishes to proceed. Pt is a higher risk pt for surgical intervention and after a long discussion on bail out status, if it were a contained rupture, surgical intervention will be considered   I have spent 60 min in review of the records, viewing studies and in face to face with patient and in coordination of future care    Eugenio Hoes 02/28/2023 3:12 PM

## 2023-03-01 ENCOUNTER — Encounter: Payer: Self-pay | Admitting: Thoracic Surgery (Cardiothoracic Vascular Surgery)

## 2023-03-01 ENCOUNTER — Institutional Professional Consult (permissible substitution): Payer: HMO | Admitting: Thoracic Surgery (Cardiothoracic Vascular Surgery)

## 2023-03-01 VITALS — BP 97/61 | HR 69 | Resp 20 | Ht 73.0 in | Wt 233.0 lb

## 2023-03-01 DIAGNOSIS — I350 Nonrheumatic aortic (valve) stenosis: Secondary | ICD-10-CM

## 2023-03-01 NOTE — Patient Instructions (Signed)
TAVR

## 2023-03-06 ENCOUNTER — Other Ambulatory Visit: Payer: Self-pay

## 2023-03-06 DIAGNOSIS — I350 Nonrheumatic aortic (valve) stenosis: Secondary | ICD-10-CM

## 2023-03-07 ENCOUNTER — Telehealth: Payer: Self-pay

## 2023-03-07 DIAGNOSIS — C61 Malignant neoplasm of prostate: Secondary | ICD-10-CM | POA: Diagnosis not present

## 2023-03-07 NOTE — Telephone Encounter (Signed)
The patient spoke with Karsten Fells RN yesterday that he didn't feel well with cold/congestion and had scheduled an appointment to see his PCP today at 2:15 PM.  I attempted to reach the patient to get an update on how this appointment went and see how the patient was feeling. Left message for pt to call back.

## 2023-03-08 NOTE — Telephone Encounter (Signed)
 Tried to call pt and left VM.

## 2023-03-12 ENCOUNTER — Encounter: Payer: Self-pay | Admitting: Podiatry

## 2023-03-12 ENCOUNTER — Ambulatory Visit (INDEPENDENT_AMBULATORY_CARE_PROVIDER_SITE_OTHER): Payer: HMO | Admitting: Podiatry

## 2023-03-12 DIAGNOSIS — M79674 Pain in right toe(s): Secondary | ICD-10-CM

## 2023-03-12 DIAGNOSIS — L989 Disorder of the skin and subcutaneous tissue, unspecified: Secondary | ICD-10-CM

## 2023-03-12 DIAGNOSIS — B351 Tinea unguium: Secondary | ICD-10-CM | POA: Diagnosis not present

## 2023-03-12 DIAGNOSIS — M79675 Pain in left toe(s): Secondary | ICD-10-CM | POA: Diagnosis not present

## 2023-03-12 NOTE — Progress Notes (Signed)
Chief Complaint  Patient presents with   Routine Post Op    Patient states that he has a problem with a [place] on left foot .     SUBJECTIVE Patient presents to office today complaining of pain and tenderness associated to a symptomatic callus lesion to the plantar aspect of the fifth MTP of the left foot.  Last visit with debridement and only felt better for few weeks.  He presents for further treatment evaluation   Past Medical History:  Diagnosis Date   Allergic rhinitis, cause unspecified    Anxiety    Aortic valve calcification    Cancer (HCC)    Prostate CA, Dr Annabell Howells 22 years ago   Constipation    Depressive disorder    Dyspepsia and other specified disorders of function of stomach    GERD (gastroesophageal reflux disease)    Hypoglycemia    Memory loss    Otitis    externa of right ear   Prostate cancer (HCC)    Spinal stenosis    Unspecified pruritic disorder     Allergies  Allergen Reactions   Doxycycline     burning on tongue   Neosporin [Bacitracin-Polymyxin B] Other (See Comments)    Redness   Penicillins Rash    Has patient had a PCN reaction causing immediate rash, facial/tongue/throat swelling, SOB or lightheadedness with hypotension: No Has patient had a PCN reaction causing severe rash involving mucus membranes or skin necrosis: No Has patient had a PCN reaction that required hospitalization: No Has patient had a PCN reaction occurring within the last 10 years: No If all of the above answers are "NO", then may proceed with Cephalosporin use.     OBJECTIVE General Patient is awake, alert, and oriented x 3 and in no acute distress. Derm Skin is dry and supple bilateral. Negative open lesions or macerations. Remaining integument unremarkable. Nails are tender, long, thickened and dystrophic with subungual debris, consistent with onychomycosis, 1-5 bilateral. No signs of infection noted.  Hyperkeratotic skin lesion noted to the plantar aspect of the  fifth MTP of the left foot Vasc  DP and PT pedal pulses palpable bilaterally. Temperature gradient within normal limits.  Neuro grossly intact via light touch Musculoskeletal Exam with weightbearing there is significant pressure applied to the fifth MTP of the left foot creating the symptomatic callus lesion.  ASSESSMENT 1.  Pain due to onychomycosis of toenails both 2.  Symptomatic callus lesion plantar aspect of the fifth MTP left  PLAN OF CARE -Patient evaluated today.  -Instructed to maintain good pedal hygiene and foot care.  -Mechanical debridement of nails 1-5 bilaterally performed using a nail nipper. Filed with dremel without incident.  -Excisional debridement of the hyperkeratotic callus lesion to the plantar aspect of the fifth MTP of the left foot was performed today using a 312 scalpel without incident or bleeding. -Quarter inch felt dancers pads were applied to the patient's foot today as well as additional pads provided to apply to the insoles of his shoes to offload pressure from the fifth MTP -Advise against when barefoot.  Continue wearing good supportive shoes and sneakers -Return to clinic as needed   Felecia Shelling, DPM [facility]  Dr. Felecia Shelling, DPM    [address]  [place], [place] [address]                Office [phone]  Fax [phone]

## 2023-03-12 NOTE — Telephone Encounter (Signed)
Per notes in Care Everywhere, pt seen by PCP on 03/07/2023.    Cough  Start Azithromycin Tablet, 250 MG, 2 now then 1 a day, Orally, once a day, 6, Refills 0.   Notes: also mucinex DM OTC. Coer for atypicals given upcoming surgery to possibly help clear him up- no sign of pneumonia       We will proceed with 2/4 TAVR as scheduled.

## 2023-03-16 ENCOUNTER — Encounter (HOSPITAL_COMMUNITY)
Admission: RE | Admit: 2023-03-16 | Discharge: 2023-03-16 | Disposition: A | Discharge status: Home / Self Care | Payer: HMO | Source: Ambulatory Visit | Attending: Cardiovascular Disease | Admitting: Cardiovascular Disease

## 2023-03-16 ENCOUNTER — Ambulatory Visit (HOSPITAL_COMMUNITY)
Admission: RE | Admit: 2023-03-16 | Discharge: 2023-03-16 | Disposition: A | Discharge status: Home / Self Care | Payer: HMO | Source: Ambulatory Visit | Attending: Cardiovascular Disease | Admitting: Cardiovascular Disease

## 2023-03-16 ENCOUNTER — Other Ambulatory Visit: Payer: Self-pay

## 2023-03-16 DIAGNOSIS — I350 Nonrheumatic aortic (valve) stenosis: Secondary | ICD-10-CM | POA: Insufficient documentation

## 2023-03-16 DIAGNOSIS — I252 Old myocardial infarction: Secondary | ICD-10-CM | POA: Insufficient documentation

## 2023-03-16 DIAGNOSIS — I2510 Atherosclerotic heart disease of native coronary artery without angina pectoris: Secondary | ICD-10-CM | POA: Diagnosis not present

## 2023-03-16 DIAGNOSIS — F17200 Nicotine dependence, unspecified, uncomplicated: Secondary | ICD-10-CM | POA: Diagnosis not present

## 2023-03-16 DIAGNOSIS — I498 Other specified cardiac arrhythmias: Secondary | ICD-10-CM | POA: Diagnosis not present

## 2023-03-16 DIAGNOSIS — Z01818 Encounter for other preprocedural examination: Secondary | ICD-10-CM | POA: Insufficient documentation

## 2023-03-16 DIAGNOSIS — Z8546 Personal history of malignant neoplasm of prostate: Secondary | ICD-10-CM | POA: Diagnosis not present

## 2023-03-16 LAB — COMPREHENSIVE METABOLIC PANEL
ALT: 12 U/L (ref 0–44)
AST: 17 U/L (ref 15–41)
Albumin: 3.8 g/dL (ref 3.5–5.0)
Alkaline Phosphatase: 55 U/L (ref 38–126)
Anion gap: 9 (ref 5–15)
BUN: 21 mg/dL (ref 8–23)
CO2: 23 mmol/L (ref 22–32)
Calcium: 9.4 mg/dL (ref 8.9–10.3)
Chloride: 106 mmol/L (ref 98–111)
Creatinine, Ser: 1.48 mg/dL — ABNORMAL HIGH (ref 0.61–1.24)
GFR, Estimated: 48 mL/min — ABNORMAL LOW (ref >60)
Glucose, Bld: 103 mg/dL — ABNORMAL HIGH (ref 70–99)
Potassium: 4.4 mmol/L (ref 3.5–5.1)
Sodium: 138 mmol/L (ref 135–145)
Total Bilirubin: 0.3 mg/dL (ref 0.0–1.2)
Total Protein: 6.6 g/dL (ref 6.5–8.1)

## 2023-03-16 LAB — CBC
HCT: 25.7 % — ABNORMAL LOW (ref 39.0–52.0)
Hemoglobin: 8.3 g/dL — ABNORMAL LOW (ref 13.0–17.0)
MCH: 31.4 pg (ref 26.0–34.0)
MCHC: 32.3 g/dL (ref 30.0–36.0)
MCV: 97.3 fL (ref 80.0–100.0)
Platelets: 241 10*3/uL (ref 150–400)
RBC: 2.64 MIL/uL — ABNORMAL LOW (ref 4.22–5.81)
RDW: 13.3 % (ref 11.5–15.5)
WBC: 4.9 10*3/uL (ref 4.0–10.5)
nRBC: 0 % (ref 0.0–0.2)

## 2023-03-16 LAB — TYPE AND SCREEN
ABO/RH(D): A POS
Antibody Screen: NEGATIVE

## 2023-03-16 LAB — PROTIME-INR
INR: 1.1 (ref 0.8–1.2)
Prothrombin Time: 14.5 s (ref 11.4–15.2)

## 2023-03-16 LAB — SURGICAL PCR SCREEN
MRSA, PCR: NEGATIVE
Staphylococcus aureus: NEGATIVE

## 2023-03-16 NOTE — Progress Notes (Signed)
Pt needs new T&S day of surgery due to previous infusion in November.

## 2023-03-16 NOTE — Progress Notes (Signed)
Patient signed all consents at PAT lab appointment. CHG soap and instructions were given to patient. CHG surgical prep reviewed with patient and all questions answered.  Patients chart send to anesthesia for review. Pt denies any respiratory illness/infection in the last two months.    Pt unable to provide urine sample due to incontinence. Lauren, RN, made aware.

## 2023-03-19 MED ORDER — POTASSIUM CHLORIDE 2 MEQ/ML IV SOLN
80.0000 meq | INTRAVENOUS | Status: DC
  Filled 2023-03-19 (×2): qty 40

## 2023-03-19 MED ORDER — NOREPINEPHRINE 4 MG/250ML-% IV SOLN
0.0000 ug/min | INTRAVENOUS | Status: DC
  Filled 2023-03-19: qty 250

## 2023-03-19 MED ORDER — HEPARIN 30,000 UNITS/1000 ML (OHS) CELLSAVER SOLUTION
Status: DC
  Filled 2023-03-19 (×2): qty 1000

## 2023-03-19 MED ORDER — MAGNESIUM SULFATE 50 % IJ SOLN
40.0000 meq | INTRAMUSCULAR | Status: DC
  Filled 2023-03-19 (×2): qty 9.85

## 2023-03-19 MED ORDER — DEXMEDETOMIDINE HCL IN NACL 400 MCG/100ML IV SOLN
0.1000 ug/kg/h | INTRAVENOUS | Status: AC
  Administered 2023-03-20 (×3): 8 ug via INTRAVENOUS
  Filled 2023-03-19: qty 100

## 2023-03-19 MED ORDER — CEFAZOLIN SODIUM-DEXTROSE 2-4 GM/100ML-% IV SOLN
2.0000 g | INTRAVENOUS | Status: AC
  Administered 2023-03-20: 2 g via INTRAVENOUS
  Filled 2023-03-19: qty 100

## 2023-03-19 NOTE — H&P (Signed)
[address]       [place] [address]             [phone]                                   Logan Wolfe [facility] Medical Record #[id number] Date of Birth: 01-13-1946   Logan Bollman, MD Daisy Floro, MD   Chief Complaint:   aortic stenosis   History of Present Illness:     Pt is a very pleasant 78 yo male with known AS who also has stage 4 metastatic prostate cancer who has a prognosis over a year and is well managed with hormonal therapy currently. He was treated for SVT last year and serial echos have shown progression of his AS. He was admitted in November with GI bleed and NSTEMI. Work up then revealed paradoxical low gradient severe AS and EF of 55%. Pt at cath with mean gradient over and with PDA stenosis felt best served with medical therapy. Pt currently with DOE with climbing stairs and no CP. He is currently fighting a cold and feels like he should see his PCP if no better in a few days             Past Medical History:  Diagnosis Date   Allergic rhinitis, cause unspecified     Anxiety     Aortic valve calcification     Cancer (HCC)      Prostate CA, Dr Annabell Howells 22 years ago   Constipation     Depressive disorder     Dyspepsia and other specified disorders of function of stomach     GERD (gastroesophageal reflux disease)     Hypoglycemia     Memory loss     Otitis      externa of right ear   Prostate cancer (HCC)     Spinal stenosis     Unspecified pruritic disorder                 Past Surgical History:  Procedure Laterality Date   BIOPSY   01/10/2023    Procedure: BIOPSY;  Surgeon: Vida Rigger, MD;  Location: [facility] ENDOSCOPY;  Service: Gastroenterology;;   COLONOSCOPY WITH PROPOFOL N/A 01/10/2023    Procedure: COLONOSCOPY WITH PROPOFOL;  Surgeon: Vida Rigger, MD;  Location: [facility] ENDOSCOPY;  Service: Gastroenterology;  Laterality: N/A;   HOT HEMOSTASIS N/A 01/10/2023    Procedure: HOT HEMOSTASIS ARGON PLASMA  COAGULATION;  Surgeon: Vida Rigger, MD;  Location: [facility] ENDOSCOPY;  Service: Gastroenterology;  Laterality: N/A;   LEFT HEART CATH AND CORONARY ANGIOGRAPHY N/A 01/09/2023    Procedure: LEFT HEART CATH AND CORONARY ANGIOGRAPHY;  Surgeon: Yates Decamp, MD;  Location: [facility] LAB;  Service: Cardiovascular;  Laterality: N/A;   PROSTATE SURGERY              Tobacco Use History  Social History        Tobacco Use  Smoking Status Light Smoker   Types: Cigars  Smokeless Tobacco Never  Tobacco Comments    Smoke a cigar when I play golf      Social History        Substance and Sexual Activity  Alcohol Use Yes   Alcohol/week: 1.0 standard drink of alcohol   Types: 1 Shots of liquor per week    Comment: a jack and  ginger daily at 5 pom      Social History         Socioeconomic History   Marital status: Married      Spouse name: [name]   Number of children: 2   Years of education: 13   Highest education level: Not on file  Occupational History      Employer: RETIRED  Tobacco Use   Smoking status: Light Smoker      Types: Cigars   Smokeless tobacco: Never   Tobacco comments:      Smoke a cigar when I play golf  Vaping Use   Vaping status: Never Used  Substance and Sexual Activity   Alcohol use: Yes      Alcohol/week: 1.0 standard drink of alcohol      Types: 1 Shots of liquor per week      Comment: a jack and ginger daily at 5 pom   Drug use: No   Sexual activity: Not Currently      Partners: Female  Other Topics Concern   Not on file  Social History Narrative    Patient is married [name]) and lives with his wife.    Patient has 2 children and his wife has 2 children.    Patient is semi-retired.    Patient has a college education.    Patient is right-handed.    Patient drinks one cup of coffee daily.    Social Drivers of Acupuncturist Strain: Not on file  Food Insecurity: No Food Insecurity (01/08/2023)    Hunger Vital Sign     Worried  About Running Out of Food in the Last Year: Never true     Ran Out of Food in the Last Year: Never true  Transportation Needs: No Transportation Needs (01/08/2023)    PRAPARE - Therapist, art (Medical): No     Lack of Transportation (Non-Medical): No  Physical Activity: Not on file  Stress: Not on file  Social Connections: Not on file  Intimate Partner Violence: Not At Risk (01/08/2023)    Humiliation, Afraid, Rape, and Kick questionnaire     Fear of Current or Ex-Partner: No     Emotionally Abused: No     Physically Abused: No     Sexually Abused: No      Allergies       Allergies  Allergen Reactions   Doxycycline        burning on tongue   Neosporin [Bacitracin-Polymyxin B] Other (See Comments)      Redness   Penicillins Rash      Has patient had a PCN reaction causing immediate rash, facial/tongue/throat swelling, SOB or lightheadedness with hypotension: No Has patient had a PCN reaction causing severe rash involving mucus membranes or skin necrosis: No Has patient had a PCN reaction that required hospitalization: No Has patient had a PCN reaction occurring within the last 10 years: No If all of the above answers are "NO", then may proceed with Cephalosporin use.              Current Outpatient Medications  Medication Sig Dispense Refill   apalutamide (ERLEADA) 60 MG tablet Take 240 mg by mouth daily.       budesonide (ENTOCORT EC) 3 MG 24 hr capsule Take 6-12 mg by mouth See admin instructions. Take 3 CAPSULES BY MOUTH DAILY FOR 4 WEEKS THEN 2 CAPSULES BY MOUTH DAILY FOR 2 WEEKS  THEN 1 CAPSULE FOR 2 WEEKS       calcium carbonate (SUPER CALCIUM) 1500 (600 Ca) MG TABS tablet Take 600 mg of elemental calcium by mouth daily with breakfast. Pt takes 2 tablets once a day.       fluticasone (FLONASE) 50 MCG/ACT nasal spray Place 2 sprays into both nostrils daily as needed for allergies.       Ibuprofen 200 MG CAPS Take 200 mg by mouth as needed.        iron polysaccharides (NIFEREX) 150 MG capsule Take 150 mg by mouth 2 (two) times daily.       Melatonin 10 MG SUBL Take 10 mg by mouth at bedtime.       metoprolol succinate (TOPROL-XL) 50 MG 24 hr tablet Take 1 tablet (50 mg total) by mouth daily. Take with or immediately following a meal. 90 tablet 3   ondansetron (ZOFRAN) 8 MG tablet Take 1 tablet (8 mg total) by mouth every 8 (eight) hours as needed for nausea or vomiting. 20 tablet 0   oxybutynin (DITROPAN) 5 MG tablet Take 5 mg by mouth 2 (two) times daily as needed.       PARoxetine (PAXIL) 20 MG tablet Take 20 mg by mouth every morning.       polyethylene glycol (MIRALAX / GLYCOLAX) 17 g packet Take 17 g by mouth daily as needed for mild constipation.       relugolix (ORGOVYX) 120 MG tablet Take 120 mg by mouth daily.       rosuvastatin (CRESTOR) 20 MG tablet Take 1 tablet (20 mg total) by mouth daily. 30 tablet 0      No current facility-administered medications for this visit.               Family History  Problem Relation Age of Onset   Hyperlipidemia Mother     Diabetes Mother     Skin cancer Father          part of right ear removed   Prostate cancer Brother     Breast cancer Neg Hx     Colon cancer Neg Hx     Pancreatic cancer Neg Hx                  Physical Exam: Lungs: overall clear Card: RR with harsh systolic murmur Ext: Warm Neuro: alert          Diagnostic Studies & Laboratory data: I have personally reviewed the following studies and agree with the findings   TTE (12/2022) IMPRESSIONS     1. Left ventricular ejection fraction, by estimation, is 60 to 65%. The  left ventricle has normal function. The left ventricle has no regional  wall motion abnormalities. Left ventricular diastolic parameters are  consistent with Grade II diastolic  dysfunction (pseudonormalization).   2. Right ventricular systolic function is normal. The right ventricular  size is normal. There is moderately elevated  pulmonary artery systolic  pressure. The estimated right ventricular systolic pressure is 56.0 mmHg.   3. Left atrial size was mildly dilated.   4. The mitral valve is degenerative. Mild mitral valve regurgitation. No  evidence of mitral stenosis. Moderate mitral annular calcification.   5. The aortic valve is tricuspid. There is severe calcifcation of the  aortic valve. Aortic valve regurgitation is mild to moderate. Severe  paradoxical low flow/low gradient aortic valve stenosis. Aortic valve  area, by VTI measures 0.95 cm. Aortic valve   mean gradient measures 29.0  mmHg.   6. The inferior vena cava is dilated in size with <50% respiratory  variability, suggesting right atrial pressure of 15 mmHg.   FINDINGS   Left Ventricle: Left ventricular ejection fraction, by estimation, is 60  to 65%. The left ventricle has normal function. The left ventricle has no  regional wall motion abnormalities. The left ventricular internal cavity  size was normal in size. There is   no left ventricular hypertrophy. Left ventricular diastolic parameters  are consistent with Grade II diastolic dysfunction (pseudonormalization).   Right Ventricle: The right ventricular size is normal. No increase in  right ventricular wall thickness. Right ventricular systolic function is  normal. There is moderately elevated pulmonary artery systolic pressure.  The tricuspid regurgitant velocity is  3.20 m/s, and with an assumed right atrial pressure of 15 mmHg, the  estimated right ventricular systolic pressure is 56.0 mmHg.   Left Atrium: Left atrial size was mildly dilated.   Right Atrium: Right atrial size was normal in size.   Pericardium: There is no evidence of pericardial effusion.   Mitral Valve: The mitral valve is degenerative in appearance. There is  moderate calcification of the mitral valve leaflet(s). Moderate mitral  annular calcification. Mild mitral valve regurgitation. No evidence of  mitral  valve stenosis.   Tricuspid Valve: The tricuspid valve is normal in structure. Tricuspid  valve regurgitation is trivial.   Aortic Valve: The aortic valve is tricuspid. There is severe calcifcation  of the aortic valve. Aortic valve regurgitation is mild to moderate.  Aortic regurgitation PHT measures 638 msec. Severe aortic stenosis is  present. Aortic valve mean gradient  measures 29.0 mmHg. Aortic valve peak gradient measures 43.6 mmHg. Aortic  valve area, by VTI measures 0.95 cm.   Pulmonic Valve: The pulmonic valve was normal in structure. Pulmonic valve  regurgitation is not visualized.   Aorta: The aortic root is normal in size and structure.   Venous: The inferior vena cava is dilated in size with less than 50%  respiratory variability, suggesting right atrial pressure of 15 mmHg.   IAS/Shunts: No atrial level shunt detected by color flow Doppler.     LEFT VENTRICLE  PLAX 2D  LVIDd:         5.10 cm   Diastology  LVIDs:         2.90 cm   LV e' medial:    5.57 cm/s  LV PW:         1.30 cm   LV E/e' medial:  12.3  LV IVS:        1.00 cm   LV e' lateral:   7.97 cm/s  LVOT diam:     1.70 cm   LV E/e' lateral: 8.6  LV SV:         73  LV SV Index:   32  LVOT Area:     2.27 cm     RIGHT VENTRICLE             IVC  RV Basal diam:  3.70 cm     IVC diam: 2.10 cm  RV S prime:     11.40 cm/s  TAPSE (M-mode): 3.2 cm   LEFT ATRIUM             Index        RIGHT ATRIUM           Index  LA diam:        4.20 cm 1.85 cm/m   RA  Area:     15.07 cm  LA Vol (A2C):   60.4 ml 26.58 ml/m  RA Volume:   38.40 ml  16.91 ml/m  LA Vol (A4C):   73.7 ml 32.45 ml/m  LA Biplane Vol: 73.5 ml 32.37 ml/m   AORTIC VALVE                     PULMONIC VALVE  AV Area (Vmax):    0.94 cm      PV Vmax:       0.83 m/s  AV Area (Vmean):   0.97 cm      PV Peak grad:  2.8 mmHg  AV Area (VTI):     0.95 cm  AV Vmax:           330.00 cm/s  AV Vmean:          226.667 cm/s  AV VTI:            0.765 m   AV Peak Grad:      43.6 mmHg  AV Mean Grad:      29.0 mmHg  LVOT Vmax:         137.00 cm/s  LVOT Vmean:        97.300 cm/s  LVOT VTI:          0.320 m  LVOT/AV VTI ratio: 0.42  AI PHT:            638 msec  AR Vena Contracta: 0.40 cm    AORTA  Ao Root diam: 2.80 cm  Ao Asc diam:  3.40 cm   MITRAL VALVE               TRICUSPID VALVE  MV Area (PHT): 3.91 cm    TR Peak grad:   41.0 mmHg  MV Decel Time: 194 msec    TR Vmax:        320.00 cm/s  MV E velocity: 68.30 cm/s  MV A velocity: 66.90 cm/s  SHUNTS  MV E/A ratio:  1.02        Systemic VTI:  0.32 m                             Systemic Diam: 1.70 cm    CATH (12/2022) Conclusion   Left Heart Catheterization 01/09/23: Hemodynamic data: LV 159/-8, EDP 14 mmHg.  Able 114/48, mean 76 mmHg.  Peak to peak gradient of 42.9 and mean gradient of 47.29 mmHg consistent with severe aortic stenosis.   Angiographic data: LM: Large-caliber vessel.  It is smooth and normal. LAD: Gives origin to moderate sized D1 and D2, after the origin of D1 there is a napkin ring like 40% stenosis.  Otherwise LAD has minimal disease. LCx: Large-caliber vessel, gives origin to 3 marginals, minimal disease is evident.  OM 1 and 2 are large. RCA: Dominant and large.  There is a distal 80% focal stenosis proximal to bifurcation.  PDA has 2 branches, 1 moderate-sized and second small branch which is subtotally occluded and is culprit for NSTEMI.  PL branch is moderate-sized.      Impression and recommendations: NSTEMI related to occlusion of a small secondary PDA and there is a focal 80% stenosis in the distal right coronary artery which can be managed medically for now.  Patient stable to proceed with further GI evaluation. Severe aortic valve stenosis as noted above.    Recent Radiology Findings:   CTA  (  02/2023) FINDINGS: Aortic Valve:   Tricuspid aortic valve with severely reduced cusp excursion. Severely thickened and severely calcified aortic valve  cusps.   AV calcium score: 3733   Virtual Basal Annulus Measurements:   Maximum/Minimum Diameter: 28.3 x 22.4 mm   Perimeter: 78.6 mm   Area:  473 mm2   Focal LVOT calcifications under LCC.   Membranous septal length: 6.5 mm.   Based on these measurements, the annulus would be suitable for a 26 mm Sapien 3 valve. Alternatively, Heart Team can consider 29 mm Evolut valve. Recommend Heart Team discussion for valve selection.   Sinus of Valsalva Measurements:   Non-coronary:  32 mm   Right - coronary:  31 mm   Left - coronary:  32 mm   Sinus of Valsalva Height:   Left: 24 mm   Right: 23 mm   Aorta: Conventional 3 vessel branch pattern of aortic arch.   Sinotubular Junction:  29 mm   Ascending Thoracic Aorta:  33 mm   Aortic Arch:  27 mm   Proximal descending aorta is mildly dilated 37 mm   Descending Thoracic Aorta:  27 mm   Coronary Artery Height above Annulus:   Left main: 18.3 mm   Right coronary: 16.6 mm   Coronary Arteries: Normal coronary origin. Right dominance. The study was performed without use of NTG and insufficient for plaque evaluation.   Optimum Fluoroscopic Angle for Delivery: LAO 6 CAU 6   OTHER:   Atria: Mild biatrial dilation.   Left atrial appendage: No thrombus.   Mitral valve: Grossly normal, mild mitral annular calcifications.   Pulmonary artery: Normal caliber.   Pulmonary veins: Normal anatomy.   IMPRESSION: 1. Tricuspid aortic valve with severely reduced cusp excursion. Severely thickened and severely calcified aortic valve cusps. 2. Aortic valve calcium score: 3733 3. Annulus area: 473 mm2, suitable for 26 mm Sapien 3 valve. Focal LVOT calcifications under LCC. Membranous septal length 6.5 mm. 4. Sufficient coronary artery heights from annulus. 5. Optimum fluoroscopic angle for delivery: LAO 6 CAU 6         Recent Lab Findings: Recent Labs       Lab Results  Component Value Date    WBC 4.6 02/02/2023     HGB 9.8 (L) 02/02/2023    HCT 28.6 (L) 02/02/2023    PLT 226 02/02/2023    GLUCOSE 99 02/02/2023    CHOL 154 01/29/2023    TRIG 103 01/29/2023    HDL 75 01/29/2023    LDLCALC 61 01/29/2023    ALT 10 01/29/2023    AST 17 01/29/2023    NA 140 02/02/2023    K 4.8 02/02/2023    CL 104 02/02/2023    CREATININE 1.27 02/02/2023    BUN 20 02/02/2023    CO2 22 02/02/2023    TSH 3.841 01/08/2023    INR 1.1 01/09/2023    HGBA1C 4.9 01/08/2023            Assessment / Plan:     78 yo male with NYHA class 2 symptoms of severe AS and slightly depressed LV function, CAD, stage 4 metastatic prostate cancer, chronic GI bleed and has acceptable anatomy for femoral access TAVR with a  26mm sapien valve. We discussed all the risks and gaols and recovery from TAVR and he understands and wishes to proceed. Pt is a higher risk pt for surgical intervention and after a long discussion on bail out status, if it were a contained rupture, surgical  intervention will be considered

## 2023-03-19 NOTE — Progress Notes (Signed)
Anesthesia APP Notation.   Case: [id number] Date/Time: 03/20/23 1330   Procedures:      Transcatheter Aortic Valve Replacement, Transfemoral     INTRAOPERATIVE TRANSTHORACIC ECHOCARDIOGRAM   Anesthesia type: Monitor Anesthesia Care   Diagnosis: Aortic stenosis, severe [I35.0]   Pre-op diagnosis: Severe Aortic Stenosis   [facility]: [facility] (CARDIOLOGY) / [facility]   Providers: Tonny Bollman, MD       DISCUSSION: Patient is a 77-year male scheduled for the above procedure. History includes smoking, aortic stenosis, CAD, prostate cancer, memory loss, GERD.  Refer to CT surgery note by Eugenio Hoes, MD and cardiology note by Cline Crock, PA-C for additional details and summary of testing.   03/15/22 PAT labs showed a H/H 8.3/25.7 and Creatinine 1.48. He presented with dyspnea with newly decreased HGB of 7.8 on 01/08/23. FOBT +.  High sensitive troponin 310 followed by 1025. GI and cardiology consulted. S/p 1 unit PRBC. Underwent cardiac cath for NSTEMI showing occlusion of a small secondary PDA and there is a focal 80% stenosis in the distal right coronary artery which can be managed medically for now. Echo showed LVEF 60-65% with progression to severe AS. Outpatient TAVR evaluation planned. S/p colonoscopy 01/10/23 which revealed external and internal hemorrhoids, diverticulosis in the sigmoid colon, multiple bleeding colonic angioectasias status post APC, petechial mucosa in mid sigmoid colon. Since then HGB ~ 8.4-9.8. Creatinine previously ~ 1.25-1.35 range since November.   Dr. Excell Seltzer reviewed pre-operative labs and felt they are in an acceptable range for planned procedure. Anesthesia team to evaluate on the day of surgery.     PROVIDERS: Daisy Floro, MD is PCP    LABS:  (all labs ordered are listed, but only abnormal results are displayed)  Labs Reviewed  CBC - Abnormal; Notable for the following components:      Result Value   RBC 2.64 (*)    Hemoglobin 8.3  (*)    HCT 25.7 (*)    All other components within normal limits  COMPREHENSIVE METABOLIC PANEL - Abnormal; Notable for the following components:   Glucose, Bld 103 (*)    Creatinine, Ser 1.48 (*)    GFR, Estimated 48 (*)    All other components within normal limits  SURGICAL PCR SCREEN  PROTIME-INR  TYPE AND SCREEN    Past Medical History:  Diagnosis Date   Allergic rhinitis, cause unspecified    Anxiety    Aortic valve calcification    Cancer (HCC)    Prostate CA, Dr Annabell Howells 22 years ago   Constipation    Depressive disorder    Dyspepsia and other specified disorders of function of stomach    GERD (gastroesophageal reflux disease)    Hypoglycemia    Memory loss    Otitis    externa of right ear   Prostate cancer (HCC)    Spinal stenosis    Unspecified pruritic disorder     Past Surgical History:  Procedure Laterality Date   BIOPSY  01/10/2023   Procedure: BIOPSY;  Surgeon: Vida Rigger, MD;  Location: [facility] ENDOSCOPY;  Service: Gastroenterology;;   COLONOSCOPY WITH PROPOFOL N/A 01/10/2023   Procedure: COLONOSCOPY WITH PROPOFOL;  Surgeon: Vida Rigger, MD;  Location: [facility] ENDOSCOPY;  Service: Gastroenterology;  Laterality: N/A;   HOT HEMOSTASIS N/A 01/10/2023   Procedure: HOT HEMOSTASIS ARGON PLASMA COAGULATION;  Surgeon: Vida Rigger, MD;  Location: [facility] ENDOSCOPY;  Service: Gastroenterology;  Laterality: N/A;   LEFT HEART CATH AND CORONARY ANGIOGRAPHY N/A 01/09/2023  Procedure: LEFT HEART CATH AND CORONARY ANGIOGRAPHY;  Surgeon: Yates Decamp, MD;  Location: [facility];  Service: Cardiovascular;  Laterality: N/A;   PROSTATE SURGERY      MEDICATIONS:  apalutamide (ERLEADA) 60 MG tablet   aspirin EC 81 MG tablet   Calcium Carb-Cholecalciferol (CALCIUM 600 + D PO)   calcium carbonate (TUMS - DOSED IN MG ELEMENTAL CALCIUM) 500 MG chewable tablet   cyanocobalamin (VITAMIN B12) 1000 MCG tablet   dextromethorphan-guaiFENesin (MUCINEX DM) 30-600 MG 12hr tablet   fluticasone  (FLONASE) 50 MCG/ACT nasal spray   iron polysaccharides (NIFEREX) 150 MG capsule   loperamide (IMODIUM A-D) 2 MG tablet   Melatonin 10 MG CHEW   metoprolol succinate (TOPROL-XL) 50 MG 24 hr tablet   PARoxetine (PAXIL) 20 MG tablet   relugolix (ORGOVYX) 120 MG tablet   rosuvastatin (CRESTOR) 20 MG tablet   No current facility-administered medications for this encounter.    Shonna Chock, PA-C Surgical Short Stay/Anesthesiology [facility] Phone [phone] [facility] Phone [phone] 03/19/2023 10:32 AM

## 2023-03-20 ENCOUNTER — Inpatient Hospital Stay (HOSPITAL_COMMUNITY): Payer: HMO

## 2023-03-20 ENCOUNTER — Inpatient Hospital Stay (HOSPITAL_COMMUNITY)
Admission: RE | Admit: 2023-03-20 | Discharge: 2023-03-21 | DRG: 266 | Disposition: A | Discharge status: Home / Self Care | Payer: HMO | Attending: Thoracic Surgery (Cardiothoracic Vascular Surgery) | Admitting: Thoracic Surgery (Cardiothoracic Vascular Surgery)

## 2023-03-20 ENCOUNTER — Other Ambulatory Visit: Payer: Self-pay

## 2023-03-20 ENCOUNTER — Inpatient Hospital Stay (HOSPITAL_COMMUNITY): Payer: HMO | Admitting: Anesthesiology

## 2023-03-20 ENCOUNTER — Encounter (HOSPITAL_COMMUNITY)
Admission: RE | Disposition: A | Discharge status: Home / Self Care | Payer: Self-pay | Source: Home / Self Care | Attending: Cardiovascular Disease

## 2023-03-20 ENCOUNTER — Encounter (HOSPITAL_COMMUNITY): Payer: Self-pay | Admitting: Cardiovascular Disease

## 2023-03-20 ENCOUNTER — Inpatient Hospital Stay (HOSPITAL_COMMUNITY): Payer: Self-pay | Admitting: Vascular Surgery

## 2023-03-20 DIAGNOSIS — Z8042 Family history of malignant neoplasm of prostate: Secondary | ICD-10-CM | POA: Diagnosis not present

## 2023-03-20 DIAGNOSIS — I252 Old myocardial infarction: Secondary | ICD-10-CM | POA: Diagnosis not present

## 2023-03-20 DIAGNOSIS — Z880 Allergy status to penicillin: Secondary | ICD-10-CM

## 2023-03-20 DIAGNOSIS — Z952 Presence of prosthetic heart valve: Secondary | ICD-10-CM | POA: Diagnosis not present

## 2023-03-20 DIAGNOSIS — I440 Atrioventricular block, first degree: Secondary | ICD-10-CM | POA: Diagnosis not present

## 2023-03-20 DIAGNOSIS — Z8546 Personal history of malignant neoplasm of prostate: Secondary | ICD-10-CM | POA: Diagnosis not present

## 2023-03-20 DIAGNOSIS — Z79818 Long term (current) use of other agents affecting estrogen receptors and estrogen levels: Secondary | ICD-10-CM | POA: Diagnosis not present

## 2023-03-20 DIAGNOSIS — D62 Acute posthemorrhagic anemia: Secondary | ICD-10-CM | POA: Diagnosis not present

## 2023-03-20 DIAGNOSIS — I350 Nonrheumatic aortic (valve) stenosis: Secondary | ICD-10-CM

## 2023-03-20 DIAGNOSIS — Z833 Family history of diabetes mellitus: Secondary | ICD-10-CM

## 2023-03-20 DIAGNOSIS — Z8719 Personal history of other diseases of the digestive system: Secondary | ICD-10-CM | POA: Diagnosis not present

## 2023-03-20 DIAGNOSIS — I5033 Acute on chronic diastolic (congestive) heart failure: Secondary | ICD-10-CM | POA: Diagnosis not present

## 2023-03-20 DIAGNOSIS — Z83438 Family history of other disorder of lipoprotein metabolism and other lipidemia: Secondary | ICD-10-CM | POA: Diagnosis not present

## 2023-03-20 DIAGNOSIS — Z881 Allergy status to other antibiotic agents status: Secondary | ICD-10-CM

## 2023-03-20 DIAGNOSIS — Z79899 Other long term (current) drug therapy: Secondary | ICD-10-CM | POA: Diagnosis not present

## 2023-03-20 DIAGNOSIS — Z006 Encounter for examination for normal comparison and control in clinical research program: Secondary | ICD-10-CM | POA: Diagnosis not present

## 2023-03-20 DIAGNOSIS — F418 Other specified anxiety disorders: Secondary | ICD-10-CM

## 2023-03-20 DIAGNOSIS — Z808 Family history of malignant neoplasm of other organs or systems: Secondary | ICD-10-CM | POA: Diagnosis not present

## 2023-03-20 DIAGNOSIS — I2510 Atherosclerotic heart disease of native coronary artery without angina pectoris: Secondary | ICD-10-CM | POA: Diagnosis not present

## 2023-03-20 DIAGNOSIS — K219 Gastro-esophageal reflux disease without esophagitis: Secondary | ICD-10-CM | POA: Diagnosis present

## 2023-03-20 DIAGNOSIS — Z01818 Encounter for other preprocedural examination: Secondary | ICD-10-CM

## 2023-03-20 DIAGNOSIS — C61 Malignant neoplasm of prostate: Secondary | ICD-10-CM | POA: Diagnosis not present

## 2023-03-20 DIAGNOSIS — I272 Pulmonary hypertension, unspecified: Secondary | ICD-10-CM | POA: Diagnosis not present

## 2023-03-20 DIAGNOSIS — F17290 Nicotine dependence, other tobacco product, uncomplicated: Secondary | ICD-10-CM | POA: Diagnosis not present

## 2023-03-20 DIAGNOSIS — N1831 Chronic kidney disease, stage 3a: Secondary | ICD-10-CM | POA: Diagnosis not present

## 2023-03-20 DIAGNOSIS — E785 Hyperlipidemia, unspecified: Secondary | ICD-10-CM

## 2023-03-20 DIAGNOSIS — C799 Secondary malignant neoplasm of unspecified site: Secondary | ICD-10-CM | POA: Diagnosis present

## 2023-03-20 DIAGNOSIS — C772 Secondary and unspecified malignant neoplasm of intra-abdominal lymph nodes: Secondary | ICD-10-CM | POA: Diagnosis present

## 2023-03-20 DIAGNOSIS — Z883 Allergy status to other anti-infective agents status: Secondary | ICD-10-CM

## 2023-03-20 DIAGNOSIS — I447 Left bundle-branch block, unspecified: Secondary | ICD-10-CM | POA: Diagnosis not present

## 2023-03-20 HISTORY — DX: Personal history of other diseases of the digestive system: Z87.19

## 2023-03-20 HISTORY — DX: Presence of prosthetic heart valve: Z95.2

## 2023-03-20 HISTORY — DX: Anemia, unspecified: D64.9

## 2023-03-20 HISTORY — DX: Nonrheumatic aortic (valve) stenosis: I35.0

## 2023-03-20 HISTORY — PX: INTRAOPERATIVE TRANSTHORACIC ECHOCARDIOGRAM: SHX6523

## 2023-03-20 LAB — CBC
HCT: 22.2 % — ABNORMAL LOW (ref 39.0–52.0)
Hemoglobin: 7.1 g/dL — ABNORMAL LOW (ref 13.0–17.0)
MCH: 30.6 pg (ref 26.0–34.0)
MCHC: 32 g/dL (ref 30.0–36.0)
MCV: 95.7 fL (ref 80.0–100.0)
Platelets: 177 10*3/uL (ref 150–400)
RBC: 2.32 MIL/uL — ABNORMAL LOW (ref 4.22–5.81)
RDW: 13 % (ref 11.5–15.5)
WBC: 3.1 10*3/uL — ABNORMAL LOW (ref 4.0–10.5)
nRBC: 0 % (ref 0.0–0.2)

## 2023-03-20 LAB — ECHOCARDIOGRAM LIMITED
AR max vel: 2 cm2
AV Area VTI: 2.01 cm2
AV Area mean vel: 2.06 cm2
AV Mean grad: 5 mm[Hg]
AV Peak grad: 10.6 mm[Hg]
Ao pk vel: 1.63 m/s
Area-P 1/2: 2.07 cm2
MV VTI: 1.4 cm2
P 1/2 time: 412 ms
S' Lateral: 3.5 cm

## 2023-03-20 LAB — POCT I-STAT, CHEM 8
BUN: 17 mg/dL (ref 8–23)
Calcium, Ion: 1.17 mmol/L (ref 1.15–1.40)
Chloride: 108 mmol/L (ref 98–111)
Creatinine, Ser: 1.4 mg/dL — ABNORMAL HIGH (ref 0.61–1.24)
Glucose, Bld: 97 mg/dL (ref 70–99)
HCT: 20 % — ABNORMAL LOW (ref 39.0–52.0)
Hemoglobin: 6.8 g/dL — CL (ref 13.0–17.0)
Potassium: 3.7 mmol/L (ref 3.5–5.1)
Sodium: 139 mmol/L (ref 135–145)
TCO2: 22 mmol/L (ref 22–32)

## 2023-03-20 LAB — PREPARE RBC (CROSSMATCH)

## 2023-03-20 SURGERY — TRANSCATHETER AORTIC VALVE REPLACEMENT, TRANSFEMORAL (CATHLAB)
Anesthesia: Monitor Anesthesia Care

## 2023-03-20 MED ORDER — SODIUM CHLORIDE 0.9% FLUSH
3.0000 mL | Freq: Two times a day (BID) | INTRAVENOUS | Status: DC
  Administered 2023-03-20 – 2023-03-21 (×2): 3 mL via INTRAVENOUS

## 2023-03-20 MED ORDER — SODIUM CHLORIDE 0.9 % IV SOLN: INTRAVENOUS | Status: DC

## 2023-03-20 MED ORDER — CHLORHEXIDINE GLUCONATE 4 % EX SOLN: 60.0000 mL | Freq: Once | CUTANEOUS | Status: DC

## 2023-03-20 MED ORDER — ONDANSETRON HCL 4 MG/2ML IJ SOLN: 4.0000 mg | Freq: Four times a day (QID) | INTRAMUSCULAR | Status: DC | PRN

## 2023-03-20 MED ORDER — CHLORHEXIDINE GLUCONATE 0.12 % MT SOLN
15.0000 mL | Freq: Once | OROMUCOSAL | Status: AC
  Administered 2023-03-20: 15 mL via OROMUCOSAL
  Filled 2023-03-20: qty 15

## 2023-03-20 MED ORDER — LIDOCAINE 2% (20 MG/ML) 5 ML SYRINGE
INTRAMUSCULAR | Status: DC | PRN
  Administered 2023-03-20: 40 mg via INTRAVENOUS

## 2023-03-20 MED ORDER — HEPARIN SODIUM (PORCINE) 1000 UNIT/ML IJ SOLN
INTRAMUSCULAR | Status: DC | PRN
  Administered 2023-03-20: 16000 [IU] via INTRAVENOUS

## 2023-03-20 MED ORDER — SODIUM CHLORIDE 0.9 % IV SOLN: INTRAVENOUS | Status: AC

## 2023-03-20 MED ORDER — ACETAMINOPHEN 500 MG PO TABS
1000.0000 mg | ORAL_TABLET | Freq: Once | ORAL | Status: AC
  Administered 2023-03-20: 1000 mg via ORAL
  Filled 2023-03-20: qty 2

## 2023-03-20 MED ORDER — CEFAZOLIN SODIUM-DEXTROSE 2-4 GM/100ML-% IV SOLN
2.0000 g | Freq: Three times a day (TID) | INTRAVENOUS | Status: AC
  Administered 2023-03-20 – 2023-03-21 (×2): 2 g via INTRAVENOUS
  Filled 2023-03-20 (×2): qty 100

## 2023-03-20 MED ORDER — MELATONIN 5 MG PO TABS
20.0000 mg | ORAL_TABLET | Freq: Every day | ORAL | Status: DC
  Administered 2023-03-20: 20 mg via ORAL
  Filled 2023-03-20: qty 4

## 2023-03-20 MED ORDER — PHENYLEPHRINE HCL-NACL 20-0.9 MG/250ML-% IV SOLN
INTRAVENOUS | Status: DC | PRN
  Administered 2023-03-20: 20 ug/min via INTRAVENOUS

## 2023-03-20 MED ORDER — POLYSACCHARIDE IRON COMPLEX 150 MG PO CAPS
150.0000 mg | ORAL_CAPSULE | Freq: Two times a day (BID) | ORAL | Status: DC
  Administered 2023-03-20 – 2023-03-21 (×2): 150 mg via ORAL
  Filled 2023-03-20 (×3): qty 1

## 2023-03-20 MED ORDER — IOPAMIDOL (ISOVUE-370) INJECTION 76%
INTRAVENOUS | Status: DC | PRN
  Administered 2023-03-20: 48 mL

## 2023-03-20 MED ORDER — SODIUM CHLORIDE 0.9 % IV SOLN: 250.0000 mL | INTRAVENOUS | Status: DC | PRN

## 2023-03-20 MED ORDER — TRAMADOL HCL 50 MG PO TABS
50.0000 mg | ORAL_TABLET | ORAL | Status: DC | PRN
  Administered 2023-03-21: 50 mg via ORAL
  Filled 2023-03-20: qty 1

## 2023-03-20 MED ORDER — FUROSEMIDE 10 MG/ML IJ SOLN
20.0000 mg | Freq: Once | INTRAMUSCULAR | Status: AC
  Administered 2023-03-20: 20 mg via INTRAVENOUS
  Filled 2023-03-20: qty 2

## 2023-03-20 MED ORDER — CHLORHEXIDINE GLUCONATE 4 % EX SOLN: 30.0000 mL | CUTANEOUS | Status: DC

## 2023-03-20 MED ORDER — MORPHINE SULFATE (PF) 2 MG/ML IV SOLN: 1.0000 mg | INTRAVENOUS | Status: DC | PRN

## 2023-03-20 MED ORDER — OXYCODONE HCL 5 MG PO TABS: 5.0000 mg | ORAL_TABLET | ORAL | Status: DC | PRN

## 2023-03-20 MED ORDER — SODIUM CHLORIDE 0.9% IV SOLUTION: Freq: Once | INTRAVENOUS | Status: AC

## 2023-03-20 MED ORDER — ONDANSETRON HCL 4 MG/2ML IJ SOLN
INTRAMUSCULAR | Status: DC | PRN
  Administered 2023-03-20: 4 mg via INTRAVENOUS

## 2023-03-20 MED ORDER — HEPARIN SODIUM (PORCINE) 1000 UNIT/ML IJ SOLN
INTRAMUSCULAR | Status: AC
  Filled 2023-03-20: qty 20

## 2023-03-20 MED ORDER — ASPIRIN 81 MG PO TBEC
81.0000 mg | DELAYED_RELEASE_TABLET | Freq: Every day | ORAL | Status: DC
  Administered 2023-03-21: 81 mg via ORAL
  Filled 2023-03-20: qty 1

## 2023-03-20 MED ORDER — LIDOCAINE HCL (PF) 1 % IJ SOLN
INTRAMUSCULAR | Status: DC | PRN
  Administered 2023-03-20 (×2): 5 mL

## 2023-03-20 MED ORDER — ACETAMINOPHEN 650 MG RE SUPP: 650.0000 mg | Freq: Four times a day (QID) | RECTAL | Status: DC | PRN

## 2023-03-20 MED ORDER — PHENYLEPHRINE 80 MCG/ML (10ML) SYRINGE FOR IV PUSH (FOR BLOOD PRESSURE SUPPORT)
PREFILLED_SYRINGE | INTRAVENOUS | Status: DC | PRN
  Administered 2023-03-20: 80 ug via INTRAVENOUS

## 2023-03-20 MED ORDER — SODIUM CHLORIDE 0.9% FLUSH: 3.0000 mL | INTRAVENOUS | Status: DC | PRN

## 2023-03-20 MED ORDER — PROTAMINE SULFATE 10 MG/ML IV SOLN
INTRAVENOUS | Status: DC | PRN
  Administered 2023-03-20 (×2): 10 mg via INTRAVENOUS
  Administered 2023-03-20: 30 mg via INTRAVENOUS

## 2023-03-20 MED ORDER — ACETAMINOPHEN 325 MG PO TABS
650.0000 mg | ORAL_TABLET | Freq: Four times a day (QID) | ORAL | Status: DC | PRN
  Administered 2023-03-21: 650 mg via ORAL
  Filled 2023-03-20: qty 2

## 2023-03-20 MED ORDER — PAROXETINE HCL 20 MG PO TABS
20.0000 mg | ORAL_TABLET | Freq: Every morning | ORAL | Status: DC
Start: 2023-03-20 — End: 2023-03-21
  Administered 2023-03-20 – 2023-03-21 (×2): 20 mg via ORAL
  Filled 2023-03-20 (×2): qty 1

## 2023-03-20 MED ORDER — NITROGLYCERIN IN D5W 200-5 MCG/ML-% IV SOLN: 0.0000 ug/min | INTRAVENOUS | Status: DC

## 2023-03-20 MED ORDER — PROPOFOL 500 MG/50ML IV EMUL
INTRAVENOUS | Status: DC | PRN
  Administered 2023-03-20: 80 ug/kg/min via INTRAVENOUS

## 2023-03-20 MED ORDER — LIDOCAINE HCL (PF) 1 % IJ SOLN
INTRAMUSCULAR | Status: AC
  Filled 2023-03-20: qty 30

## 2023-03-20 SURGICAL SUPPLY — 31 items
BAG SNAP BAND KOVER 36X36 (MISCELLANEOUS) ×4 IMPLANT
CABLE ADAPT PACING TEMP 12FT (ADAPTER) IMPLANT
CATH 26 ULTRA DELIVERY (CATHETERS) IMPLANT
CATH DIAG 6FR PIGTAIL ANGLED (CATHETERS) IMPLANT
CATH INFINITI 5FR ANG PIGTAIL (CATHETERS) IMPLANT
CATH INFINITI 6F AL2 (CATHETERS) IMPLANT
CATH S G BIP PACING (CATHETERS) IMPLANT
CLOSURE MYNX CONTROL 6F/7F (Vascular Products) IMPLANT
CLOSURE PERCLOSE PROSTYLE (VASCULAR PRODUCTS) IMPLANT
COVER SURGICAL LIGHT HANDLE (MISCELLANEOUS) IMPLANT
CRIMPER (MISCELLANEOUS) IMPLANT
DEVICE INFLATION ATRION QL2530 (MISCELLANEOUS) IMPLANT
HEMOSTAT KELLY 5.5 SS STRL (MISCELLANEOUS) IMPLANT
KIT SAPIAN 3 ULTRA RESILIA 26 (Valve) IMPLANT
PACK CARDIAC CATHETERIZATION (CUSTOM PROCEDURE TRAY) ×2 IMPLANT
PROTECTION STATION PRESSURIZED (MISCELLANEOUS) ×1
SET ATX-X65L (MISCELLANEOUS) IMPLANT
SHEATH BRITE TIP 7FR 35CM (SHEATH) IMPLANT
SHEATH INTRODUCER SET 20-26 (SHEATH) IMPLANT
SHEATH PINNACLE 6F 10CM (SHEATH) IMPLANT
SHEATH PINNACLE 8F 10CM (SHEATH) IMPLANT
SHEATH PROBE COVER 6X72 (BAG) IMPLANT
STATION PROTECTION PRESSURIZED (MISCELLANEOUS) IMPLANT
STOPCOCK MORSE 400PSI 3WAY (MISCELLANEOUS) ×4 IMPLANT
TUBING ART PRESS 72 MALE/FEM (TUBING) IMPLANT
WIRE AMPLATZ SS-J .035X180CM (WIRE) IMPLANT
WIRE EMERALD 3MM-J .035X150CM (WIRE) IMPLANT
WIRE EMERALD 3MM-J .035X260CM (WIRE) IMPLANT
WIRE EMERALD ST .035X260CM (WIRE) IMPLANT
WIRE MICRO SET SILHO 5FR 7 (SHEATH) IMPLANT
WIRE SAFARI SM CURVE 275 (WIRE) IMPLANT

## 2023-03-20 NOTE — Progress Notes (Signed)
  Pt arrived from cath via Bed to HA. Report received from CRNA. Pt vitals are stable, performed q69min see vitals flowsheet. Pt STAT CBC drawn and sent. Repeat Hbg 7.1. 1u PRBCs infusing. Anesthesia recovery has been uneventful. Pt to transition to 4E shortly. Will continue to monitor patient while under holding area care.

## 2023-03-20 NOTE — Progress Notes (Signed)
Pt arrived from ...cath.., A/ox 4...pt denies any pain, MD aware,CCMD called. CHG bath given,no further needs at this time   

## 2023-03-20 NOTE — Interval H&P Note (Signed)
 History and Physical Interval Note:  03/20/2023 11:27 AM  Logan Wolfe  has presented today for surgery, with the diagnosis of Severe Aortic Stenosis.  The various methods of treatment have been discussed with the patient and family. After consideration of risks, benefits and other options for treatment, the patient has consented to  Procedure(s): Transcatheter Aortic Valve Replacement, Transfemoral (N/A) INTRAOPERATIVE TRANSTHORACIC ECHOCARDIOGRAM (N/A) as a surgical intervention.  The patient's history has been reviewed, patient examined, no change in status, stable for surgery.  I have reviewed the patient's chart and labs.  Questions were answered to the patient's satisfaction.     Deward Kallman

## 2023-03-20 NOTE — Progress Notes (Signed)
  [facility]   MULTIDISCIPLINARY HEART VALVE TEAM  Patient doing well s/p TAVR. He is hemodynamically stable. Groin sites stable. ECG with sinus brady with new LBBB and 1st deg AV block but no high grade block. ISTAT hg 6.9 and stat CBC with hg 7.1. LVEDP ~24 at the time of TAVR. Will give 1U PRBCs followed by one dose of IV lasix  20mg .   Plan to transfer to from cath lab holding to 4E when bed available. Early ambulation after bedrest completed and hopeful discharge over the next 24-48 hours.   Lamarr Hummer PA-C  [facility]  Pager [phone]

## 2023-03-20 NOTE — Op Note (Signed)
 HEART AND VASCULAR CENTER   MULTIDISCIPLINARY HEART VALVE TEAM   TAVR OPERATIVE NOTE   Date of Procedure:  03/20/2023  Preoperative Diagnosis: Severe Aortic Stenosis   Postoperative Diagnosis: Same   Procedure:   Transcatheter Aortic Valve Replacement - Percutaneous Transfemoral Approach  Edwards Sapien 3 Ultra Resilia THV (size 26 mm, serial # [id number] )   Co-Surgeons:  Deward Kallman, MD and Ozell Fell, MD  Anesthesiologist:  Debby Like, MD  Echocardiographer:  Jerel Balding, MD  Pre-operative Echo Findings: Severe aortic stenosis Normal left ventricular systolic function  Post-operative Echo Findings: Trace paravalvular leak Normal/unchanged left ventricular systolic function  BRIEF CLINICAL NOTE AND INDICATIONS FOR SURGERY  78 year old man with severe symptomatic aortic stenosis and chronic GI bleed presents today for TAVR after undergoing multidisciplinary heart team review of his case.  He has undergone appropriate preoperative imaging studies with cardiac catheterization and CT angiography and is found to have transfemoral access for TAVR with a 26 mm SAPIEN 3 valve.  During the course of the patient's preoperative work up they have been evaluated comprehensively by a multidisciplinary team of specialists coordinated through the Multidisciplinary Heart Valve Clinic in the [facility].  They have been demonstrated to suffer from symptomatic severe aortic stenosis as noted above. The patient has been counseled extensively as to the relative risks and benefits of all options for the treatment of severe aortic stenosis including long term medical therapy, conventional surgery for aortic valve replacement, and transcatheter aortic valve replacement.  The patient has been independently evaluated in formal cardiac surgical consultation by Dr Kallman, who deemed the patient appropriate for TAVR. Based upon review of all of the patient's preoperative  diagnostic tests they are felt to be candidate for transcatheter aortic valve replacement using the transfemoral approach as an alternative to conventional surgery.    Following the decision to proceed with transcatheter aortic valve replacement, a discussion has been held regarding what types of management strategies would be attempted intraoperatively in the event of life-threatening complications, including whether or not the patient would be considered a candidate for the use of cardiopulmonary bypass and/or conversion to open sternotomy for attempted surgical intervention.  The patient has been advised of a variety of complications that might develop peculiar to this approach including but not limited to risks of death, stroke, paravalvular leak, aortic dissection or other major vascular complications, aortic annulus rupture, device embolization, cardiac rupture or perforation, acute myocardial infarction, arrhythmia, heart block or bradycardia requiring permanent pacemaker placement, congestive heart failure, respiratory failure, renal failure, pneumonia, infection, other late complications related to structural valve deterioration or migration, or other complications that might ultimately cause a temporary or permanent loss of functional independence or other long term morbidity.  The patient provides full informed consent for the procedure as described and all questions were answered preoperatively.  DETAILS OF THE OPERATIVE PROCEDURE  PREPARATION:   The patient is brought to the operating room on the above mentioned date and central monitoring was established by the anesthesia team including placement of a radial arterial line. The patient is placed in the supine position on the operating table.  Intravenous antibiotics are administered. The patient is monitored closely throughout the procedure under conscious sedation. Baseline transthoracic echocardiogram is performed. The patient's chest,  abdomen, both groins, and both lower extremities are prepared and draped in a sterile manner. A time out procedure is performed.   PERIPHERAL ACCESS:   Using ultrasound guidance, femoral arterial and venous access  is obtained with placement of 6 Fr sheaths on the left side.  US  images are digitally captured and stored in the patient's chart. A pigtail diagnostic catheter was passed through the femoral arterial sheath under fluoroscopic guidance into the aortic root.  A temporary transvenous pacemaker catheter was passed through the femoral venous sheath under fluoroscopic guidance into the right ventricle.  The pacemaker was tested to ensure stable lead placement and pacemaker capture. Aortic root angiography was performed in order to determine the optimal angiographic angle for valve deployment.  TRANSFEMORAL ACCESS:  A micropuncture technique is used to access the right femoral artery under fluoroscopic and ultrasound guidance.  2 Perclose devices are deployed at 10' and 2' positions to 'PreClose' the femoral artery. An 8 French sheath is placed and then an Amplatz Superstiff wire is advanced through the sheath. This is changed out for a 14 French transfemoral E-Sheath after progressively dilating over the Superstiff wire.  An AL-2 catheter was used to direct a straight-tip exchange length wire across the native aortic valve into the left ventricle. This was exchanged out for a pigtail catheter and position was confirmed in the LV apex.  Simultaneous LV and Ao pressures were recorded.  The pigtail catheter was exchanged for a Safari wire in the LV apex.    BALLOON AORTIC VALVULOPLASTY:  Not performed  TRANSCATHETER HEART VALVE DEPLOYMENT:  An Edwards Sapien 3 Ultra Resilia transcatheter heart valve (size 26 mm) was prepared and crimped per manufacturer's guidelines, and the proper orientation of the valve is confirmed on the [employer] delivery system. The valve was advanced through the  introducer sheath using normal technique until in an appropriate position in the abdominal aorta beyond the sheath tip. The balloon was then retracted and using the fine-tuning wheel was centered on the valve. The valve was then advanced across the aortic arch using appropriate flexion of the catheter. The valve was carefully positioned across the aortic valve annulus. The Commander catheter was retracted using normal technique. Once final position of the valve has been confirmed by angiographic assessment, the valve is deployed while temporarily holding ventilation and during rapid ventricular pacing to maintain systolic blood pressure < 50 mmHg and pulse pressure < 10 mmHg. The balloon inflation is held for >3 seconds after reaching full deployment volume. Once the balloon has fully deflated the balloon is retracted into the ascending aorta and valve function is assessed using echocardiography. The patient's hemodynamic recovery following valve deployment is good.  The deployment balloon and guidewire are both removed. Echo demostrated acceptable post-procedural gradients, stable mitral valve function, and trace aortic insufficiency.    PROCEDURE COMPLETION:  The sheath was removed and femoral artery closure is performed using the 2 previously deployed Perclose devices.  Protamine  is administered once femoral arterial repair was complete. The site is clear with no evidence of bleeding or hematoma after the sutures are tightened. The temporary pacemaker and pigtail catheters are removed. Mynx closure is used for contralateral femoral arterial hemostasis for the 6 Fr sheath.  The patient tolerated the procedure well and is transported to the recovery area in stable condition. There were no immediate intraoperative complications. All sponge instrument and needle counts are verified correct at completion of the operation.   The patient received a total of 30 mL of intravenous contrast during the  procedure.  EBL: minimal  LVEDP: 23 mmHg   Ozell Fell, MD 03/20/2023 4:47 PM

## 2023-03-20 NOTE — Op Note (Signed)
 HEART AND VASCULAR CENTER   MULTIDISCIPLINARY HEART VALVE TEAM   TAVR OPERATIVE NOTE   Date of Procedure:  03/20/2023  Preoperative Diagnosis: Severe Aortic Stenosis   Postoperative Diagnosis: Same   Procedure:   Transcatheter Aortic Valve Replacement - Percutaneous right Transfemoral Approach  Edwards Sapien 3 Ultra THV (size 26 mm, model # [id number])   Co-Surgeons:  Deward Kallman MD and Ozell Fell, MD   Anesthesiologist:  Dr Lucious  Echocardiographer:  Cr Croitoru  Pre-operative Echo Findings: Severe aortic stenosis normal left ventricular systolic function  Post-operative Echo Findings: mild paravalvular leak Normal left ventricular systolic function   BRIEF CLINICAL NOTE AND INDICATIONS FOR SURGERY  78 yo male with NYHA class 2 symptoms of severe AS and slightly depressed LV function, CAD, stage 4 metastatic prostate cancer, chronic GI bleed and has acceptable anatomy for femoral access TAVR with a 26mm sapien valve. We discussed all the risks and gaols and recovery from TAVR and he understands and wishes to proceed. Pt is a higher risk pt for surgical intervention and after a long discussion on bail out status, if it were a contained rupture, surgical intervention will be considered     DETAILS OF THE OPERATIVE PROCEDURE  PREPARATION:    The patient was brought to the operating room on the above mentioned date and appropriate monitoring was established by the anesthesia team. The patient was placed in the supine position on the operating table.  Intravenous antibiotics were administered. The patient was monitored closely throughout the procedure under conscious sedation.  Baseline transthoracic echocardiogram was performed. The patient's abdomen and both groins were prepped and draped in a sterile manner. A time out procedure was performed.   PERIPHERAL ACCESS:    Using the modified Seldinger technique, femoral arterial and venous access was obtained with  placement of 6 Fr sheaths on the left side.  A pigtail diagnostic catheter was passed through the left arterial sheath under fluoroscopic guidance into the aortic root.  A temporary transvenous pacemaker catheter was passed through the left femoral venous sheath under fluoroscopic guidance into the right ventricle.  The pacemaker was tested to ensure stable lead placement and pacemaker capture. Aortic root angiography was performed in order to determine the optimal angiographic angle for valve deployment.   TRANSFEMORAL ACCESS:   Percutaneous transfemoral access and sheath placement was performed using ultrasound guidance.  The right common femoral artery was cannulated using a micropuncture needle and appropriate location was verified using hand injection angiogram.  A pair of Abbott Perclose percutaneous closure devices were placed and a 6 French sheath replaced into the femoral artery.  The patient was heparinized systemically and ACT verified > 250 seconds.    A 14 Fr transfemoral E-sheath was introduced into the right common femoral artery after progressively dilating over an Amplatz superstiff wire. An A:2 catheter was used to direct a straight-tip exchange length wire across the native aortic valve into the left ventricle. This was exchanged out for a pigtail catheter and position was confirmed in the LV apex. Simultaneous LV and Ao pressures were recorded. LVEDP was  The pigtail catheter was exchanged for a Safari wire in the LV apex.   BALLOON AORTIC VALVULOPLASTY:   Not done   TRANSCATHETER HEART VALVE DEPLOYMENT:   An Edwards Sapien 3 Ultra transcatheter heart valve (size 26 mm) was prepared and crimped per manufacturer's guidelines, and the proper orientation of the valve is confirmed on the [employer] delivery system. The valve was advanced through  the introducer sheath using normal technique until in an appropriate position in the abdominal aorta beyond the sheath tip. The  balloon was then retracted and using the fine-tuning wheel was centered on the valve. The valve was then advanced across the aortic arch using appropriate flexion of the catheter. The valve was carefully positioned across the aortic valve annulus. The Commander catheter was retracted using normal technique. Once final position of the valve has been confirmed by angiographic assessment, the valve is deployed during rapid ventricular pacing to maintain systolic blood pressure < 50 mmHg and pulse pressure < 10 mmHg. The balloon inflation is held for >3 seconds after reaching full deployment volume. Once the balloon has fully deflated the balloon is retracted into the ascending aorta and valve function is assessed using echocardiography. There is felt to be mild paravalvular leak and no central aortic insufficiency.  The patient's hemodynamic recovery following valve deployment is good.  The deployment balloon and guidewire are both removed.    PROCEDURE COMPLETION:   The sheath was removed and femoral artery closure performed.  Protamine  was administered once femoral arterial repair was complete. The temporary pacemaker, pigtail catheter and femoral sheaths were removed with manual pressure used for venous hemostasis.  A Mynx femoral closure device was utilized following removal of the diagnostic sheath in the left femoral artery.  The patient tolerated the procedure well and is transported to the cath lab recovery area in stable condition. There were no immediate intraoperative complications. All sponge instrument and needle counts are verified correct at completion of the operation.   No blood products were administered during the operation.  The patient received a total of 30 mL of intravenous contrast during the procedure.   Deward Kallman, MD 03/20/2023 3:46 PM

## 2023-03-20 NOTE — Discharge Summary (Addendum)
 HEART AND VASCULAR CENTER   MULTIDISCIPLINARY HEART VALVE TEAM  Discharge Summary    Patient ID: Logan Wolfe MRN: [id number]; DOB: 1945-05-20  Admit date: 03/20/2023 Discharge date: 03/21/2023  Primary Care Provider: Okey Carlin Redbird, MD  Primary Cardiologist: Ozell Fell, MD / Dr. Fell & Dr. Maryjane (TAVR)  Discharge Diagnoses    Principal Problem:   S/P TAVR (transcatheter aortic valve replacement) Active Problems:   GERD (gastroesophageal reflux disease)   HLD (hyperlipidemia)   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HCC)   Severe aortic stenosis   CKD stage 3a, GFR 45-59 ml/min (HCC)   History of GI bleed   Acute on chronic diastolic heart failure (HCC)   Allergies Allergies  Allergen Reactions   Doxycycline     burning on tongue   Neosporin [Bacitracin-Polymyxin B] Other (See Comments)    Redness   Penicillins Rash    Diagnostic Studies/Procedures    TAVR OPERATIVE NOTE     Date of Procedure:                03/20/2023   Preoperative Diagnosis:      Severe Aortic Stenosis    Postoperative Diagnosis:    Same    Procedure:        Transcatheter Aortic Valve Replacement - Percutaneous right Transfemoral Approach             Edwards Sapien 3 Ultra THV (size 26 mm, model # [id number])              Co-Surgeons:                        Deward Maryjane MD and Ozell Fell, MD    Anesthesiologist:                  Dr Lucious   Echocardiographer:              Cr Croitoru   Pre-operative Echo Findings: Severe aortic stenosis normal left ventricular systolic function   Post-operative Echo Findings: mild paravalvular leak Normal left ventricular systolic function  _____________    Echo 03/21/23: completed but pending formal read at the time of discharge   History of Present Illness     Logan Wolfe is a 78 y.o. male with a history of metastatic prostate cancer s/p resection/XRT/hormonal therapy, pulmonary hypertension, CAD, SVT, CKD stage IIIa, HLD, GERD,  anemia, recent GI bleed and severe aortic stenosis who presented to [facility] on 03/20/23 for planned TAVR.   He was admitted 11/25-11/27/24 for NSTEMI in the setting of acute GI bleed. HStop peaked at 1921. Cath 11/26 showed an 80% focal stenosis proximal to bifurcation, and subtotally occluded small PDA branch likely the culprit of NSTEMI. Medical management was recommended. He required transfusion for acute anemia (hg down to 7.8) related to GI bleeding. Colonoscopy 01/10/23 with external/internal hemorrhoids, multiple bleeding colonic angioectasias treated with APC. 2D echo showed EF 60-65% with severe paradoxical LFLG AS with a mean gradient of 29 mm hg, AVA 0.95 cm2, mild to mod AI, mod MAC with mild MR, and moderate pulm htn. Aortic valve interrogation on cath showed a mean gradient of 47 mm hg. He was discharged home with plans for outpatient TAVR work up.   The patient was evaluated by the multidisciplinary valve team and felt to have severe, symptomatic aortic stenosis and to be a suitable candidate for TAVR, which was set up for 03/20/23.  Hospital Course     Consultants: none  Severe AS: s/p successful TAVR with a 26 mm Edwards Sapien 3 Ultra Resilia THV via the TF approach on 03/20/23. -- Post operative echo completed but pending formal read.  -- Groin sites are stable.  -- Continue Asprin 81 mg daily.  -- Met with cardiac rehab to discuss CRP phase II.  -- Plan for discharge home today with close follow up in the outpatient setting.   New LBBB: initial post op ECG showed a new LBBB and 1st deg AV block. Repeat ECG this AM shows only a persistent LBBB.  -- Will discharge with a Zio AT to rule out late presenting HAVB.   Acute on chronic diastolic CHF: as evidenced by an elevated LVEDP ~23 at the time of TAVR and treated with IV lasix  20mg  x 1.  -- Not on any standing diuretics at home. Will follow as an outpatient.   CAD: recent NSTEMI in 12/2022. -- Cath 01/09/23 showed an 80% focal  stenosis proximal to bifurcation, and subtotally occluded small PDA branch likely the culprit of NSTEMI. Plan medical management -- Continue ASA 81mg  daily, Toprol  XL 50mg  daily and Crestor  20mg  daily (pt reported not taking on hospital admit- will discuss in the office next week).   Acute blood loss anemia 2/2 GIB: Hg noted to drop to 6.8. Treated with 1UPRBCs with improvement to 8.5. -- Recent colonoscopy 01/10/23 with external/internal hemorrhoids, multiple bleeding colonic angioectasias treated with APC. Followed by Dr. Fayne -- Will watch Hg closely on outpatient labs and refer back to GI if he continues to have issues with acute blood loss anemia.   CKD stage IIIa: creat 1.38 with a GFR of 52 on the day of discharge.  -- Not on any nephrotoxic medications.  -- Will recheck next week in the office.   Hx of metastatic prostate CA: follows with [facility], s/p resection/radiation and on hormonal therapy. _____________  Discharge Vitals Blood pressure 131/73, pulse (!) 57, temperature 97.8 F (36.6 C), temperature source Oral, resp. rate 17, height 6' 1 (1.854 m), weight 103.7 kg, SpO2 96%.  Filed Weights   03/19/23 1400 03/20/23 1127 03/21/23 0422  Weight: 105.7 kg 104.3 kg 103.7 kg    GEN: Well nourished, well developed, in no acute distress HEENT: normal Neck: no JVD or masses Cardiac: RRR; no murmurs, rubs, or gallops,no edema  Respiratory:  clear to auscultation bilaterally, normal work of breathing GI: soft, nontender, nondistended, + BS MS: no deformity or atrophy Skin: warm and dry, no rash.  Groin sites clear without hematoma or ecchymosis. Neuro:  Alert and Oriented x 3, Strength and sensation are intact Psych: euthymic mood, full affect  Disposition   Pt is being discharged home today in good condition.  Follow-up Plans & Appointments     Follow-up Information     Sebastian Lamarr SAUNDERS, PA-C. Go on 03/30/2023.   Specialties: Cardiology, Radiology Why: @  9:50am, please arrive at least 15 minutes early. Contact information: [address] [place] [address] [phone]                Discharge Instructions     Amb Referral to Cardiac Rehabilitation   Complete by: As directed    Diagnosis: Valve Replacement   Valve: Aortic   After initial evaluation and assessments completed: Virtual Based Care may be provided alone or in conjunction with Phase 2 Cardiac Rehab based on patient barriers.: Yes   Intensive Cardiac Rehabilitation (ICR) [facility] location only OR Traditional Cardiac Rehabilitation (TCR) *  If criteria for ICR are not met will enroll in TCR [facility] only): Yes       Discharge Medications   Allergies as of 03/21/2023       Reactions   Doxycycline    burning on tongue   Neosporin [bacitracin-polymyxin B] Other (See Comments)   Redness   Penicillins Rash        Medication List     TAKE these medications    aspirin  EC 81 MG tablet Take 81 mg by mouth daily. Swallow whole.   CALCIUM  600 + D PO Take 2 tablets by mouth daily.   calcium  carbonate 500 MG chewable tablet Commonly known as: TUMS - dosed in mg elemental calcium  Chew 1 tablet by mouth daily as needed for indigestion or heartburn.   cyanocobalamin  1000 MCG tablet Commonly known as: VITAMIN B12 Take 1,000 mcg by mouth daily.   dextromethorphan-guaiFENesin  30-600 MG 12hr tablet Commonly known as: MUCINEX  DM Take 1 tablet by mouth every 4 (four) hours as needed for cough.   Erleada  60 MG tablet Generic drug: apalutamide  Take 240 mg by mouth daily.   fluticasone 50 MCG/ACT nasal spray Commonly known as: FLONASE Place 2 sprays into both nostrils daily as needed for allergies.   iron  polysaccharides 150 MG capsule Commonly known as: NIFEREX Take 150 mg by mouth 2 (two) times daily.   loperamide 2 MG tablet Commonly known as: IMODIUM A-D Take 2 mg by mouth 4 (four) times daily as needed for diarrhea or loose stools.   Melatonin  10 MG Chew Chew 20 mg by mouth at bedtime. May take another 10 mg dose during the middle of the night as needed for sleep   metoprolol  succinate 50 MG 24 hr tablet Commonly known as: TOPROL -XL Take 1 tablet (50 mg total) by mouth daily. Take with or immediately following a meal.   Orgovyx  120 MG tablet Generic drug: relugolix  Take 240 mg by mouth daily.   PARoxetine  20 MG tablet Commonly known as: PAXIL  Take 20 mg by mouth every morning.   rosuvastatin  20 MG tablet Commonly known as: CRESTOR  Take 1 tablet (20 mg total) by mouth daily.          Outstanding Labs/Studies   CBC, BMET  ______________________  Duration of Discharge Encounter: APP Time: 60 minutes    Signed, Lamarr Hummer, PA-C 03/21/2023, 11:13 AM [phone]

## 2023-03-20 NOTE — Transfer of Care (Signed)
 Immediate Anesthesia Transfer of Care Note  Patient: Logan Wolfe  Procedure(s) Performed: Transcatheter Aortic Valve Replacement, Transfemoral INTRAOPERATIVE TRANSTHORACIC ECHOCARDIOGRAM  Patient Location: PACU and Cath Lab  Anesthesia Type:MAC  Level of Consciousness: awake, patient cooperative, and responds to stimulation  Airway & Oxygen Therapy: Patient Spontanous Breathing and Patient connected to face mask oxygen  Post-op Assessment: Report given to RN and Post -op Vital signs reviewed and stable  Post vital signs: Reviewed and stable  Last Vitals:  Vitals Value Taken Time  BP 135/73 03/20/23 1536  Temp    Pulse 58 03/20/23 1537  Resp 12 03/20/23 1537  SpO2 100 % 03/20/23 1537  Vitals shown include unfiled device data.  Last Pain:  Vitals:   03/20/23 1513  TempSrc:   PainSc: Asleep         Complications: There were no known notable events for this encounter.

## 2023-03-20 NOTE — Anesthesia Preprocedure Evaluation (Addendum)
Anesthesia Evaluation  Patient identified by MRN, date of birth, ID band Patient awake    Reviewed: Allergy & Precautions, NPO status , Patient's Chart, lab work & pertinent test results  History of Anesthesia Complications Negative for: history of anesthetic complications  Airway Mallampati: II  TM Distance: >3 FB Neck ROM: Full    Dental  (+) Dental Advisory Given, Teeth Intact   Pulmonary Current Smoker and Patient abstained from smoking.   Pulmonary exam normal        Cardiovascular pulmonary hypertension+ Past MI  + Valvular Problems/Murmurs AS and AI  Rhythm:Regular Rate:Normal + Systolic murmurs  '24 Cath - NSTEMI related to occlusion of a small secondary PDA and there is a focal 80% stenosis in the distal right coronary artery which can be managed medically for now.  Patient stable to proceed with further GI evaluation. Severe aortic valve stenosis as noted above.  '24 TTE - EF 60 to 65%. Grade II diastolic dysfunction (pseudonormalization). There is moderately elevated pulmonary artery systolic pressure. The estimated right ventricular systolic pressure is 56.0 mmHg. Left atrial size was mildly dilated. Mild mitral valve regurgitation. Aortic valve regurgitation is mild to moderate. 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 Aortic valve area, by VTI measures 0.95 cm. Aortic valve  mean gradient measures 29.0 mmHg.      Neuro/Psych  PSYCHIATRIC DISORDERS Anxiety Depression     Memory loss    GI/Hepatic Neg liver ROS,GERD  Controlled,,  Endo/Other   Obesity   Renal/GU CRFRenal disease    Prostate cancer     Musculoskeletal negative musculoskeletal ROS (+)    Abdominal   Peds  Hematology  (+) Blood dyscrasia, anemia   Anesthesia Other Findings   Reproductive/Obstetrics                             Anesthesia Physical Anesthesia Plan  ASA:  4  Anesthesia Plan: MAC   Post-op Pain Management: Tylenol PO (pre-op)* and Minimal or no pain anticipated   Induction:   PONV Risk Score and Plan: 0 and Propofol infusion and Treatment may vary due to age or medical condition  Airway Management Planned: Natural Airway and Simple Face Mask  Additional Equipment: Arterial line  Intra-op Plan:   Post-operative Plan:   Informed Consent: I have reviewed the patients History and Physical, chart, labs and discussed the procedure including the risks, benefits and alternatives for the proposed anesthesia with the patient or authorized representative who has indicated his/her understanding and acceptance.       Plan Discussed with: CRNA and Anesthesiologist  Anesthesia Plan Comments:         Anesthesia Quick Evaluation

## 2023-03-21 ENCOUNTER — Inpatient Hospital Stay (INDEPENDENT_AMBULATORY_CARE_PROVIDER_SITE_OTHER)
Admit: 2023-03-21 | Discharge: 2023-03-21 | Disposition: A | Discharge status: Home / Self Care | Payer: HMO | Attending: Physician Assistant | Admitting: Physician Assistant

## 2023-03-21 ENCOUNTER — Encounter (HOSPITAL_COMMUNITY): Payer: Self-pay | Admitting: Cardiovascular Disease

## 2023-03-21 ENCOUNTER — Inpatient Hospital Stay (HOSPITAL_COMMUNITY): Payer: HMO

## 2023-03-21 DIAGNOSIS — I447 Left bundle-branch block, unspecified: Secondary | ICD-10-CM | POA: Diagnosis not present

## 2023-03-21 DIAGNOSIS — I5033 Acute on chronic diastolic (congestive) heart failure: Secondary | ICD-10-CM | POA: Insufficient documentation

## 2023-03-21 DIAGNOSIS — Z952 Presence of prosthetic heart valve: Secondary | ICD-10-CM

## 2023-03-21 LAB — TYPE AND SCREEN
ABO/RH(D): A POS
Antibody Screen: NEGATIVE
Unit division: 0

## 2023-03-21 LAB — ECHOCARDIOGRAM COMPLETE
AR max vel: 2.15 cm2
AV Area VTI: 2.71 cm2
AV Area mean vel: 2.16 cm2
AV Mean grad: 8 mm[Hg]
AV Peak grad: 16 mm[Hg]
Ao pk vel: 2 m/s
Area-P 1/2: 3.05 cm2
Calc EF: 62.8 %
Height: 73 in
S' Lateral: 4.2 cm
Single Plane A2C EF: 64 %
Single Plane A4C EF: 61.5 %
Weight: 3657.6 [oz_av]

## 2023-03-21 LAB — CBC
HCT: 25.3 % — ABNORMAL LOW (ref 39.0–52.0)
Hemoglobin: 8.5 g/dL — ABNORMAL LOW (ref 13.0–17.0)
MCH: 30.8 pg (ref 26.0–34.0)
MCHC: 33.6 g/dL (ref 30.0–36.0)
MCV: 91.7 fL (ref 80.0–100.0)
Platelets: 168 10*3/uL (ref 150–400)
RBC: 2.76 MIL/uL — ABNORMAL LOW (ref 4.22–5.81)
RDW: 13.4 % (ref 11.5–15.5)
WBC: 4.8 10*3/uL (ref 4.0–10.5)
nRBC: 0 % (ref 0.0–0.2)

## 2023-03-21 LAB — BPAM RBC
Blood Product Expiration Date: 202502102359
ISSUE DATE / TIME: 202502041623
Unit Type and Rh: 6200

## 2023-03-21 LAB — BASIC METABOLIC PANEL
Anion gap: 10 (ref 5–15)
BUN: 17 mg/dL (ref 8–23)
CO2: 24 mmol/L (ref 22–32)
Calcium: 8.8 mg/dL — ABNORMAL LOW (ref 8.9–10.3)
Chloride: 104 mmol/L (ref 98–111)
Creatinine, Ser: 1.38 mg/dL — ABNORMAL HIGH (ref 0.61–1.24)
GFR, Estimated: 52 mL/min — ABNORMAL LOW (ref >60)
Glucose, Bld: 100 mg/dL — ABNORMAL HIGH (ref 70–99)
Potassium: 3.7 mmol/L (ref 3.5–5.1)
Sodium: 138 mmol/L (ref 135–145)

## 2023-03-21 LAB — MAGNESIUM: Magnesium: 1.8 mg/dL (ref 1.7–2.4)

## 2023-03-21 MED ORDER — PERFLUTREN LIPID MICROSPHERE
1.0000 mL | INTRAVENOUS | Status: AC | PRN
  Administered 2023-03-21: 1 mL via INTRAVENOUS

## 2023-03-21 NOTE — Discharge Instructions (Signed)
 ACTIVITY AND EXERCISE  Daily activity and exercise are an important part of your recovery. People recover at different rates depending on their general health and type of valve procedure.  Most people recovering from TAVR feel better relatively quickly   No lifting, pushing, pulling more than 10 pounds (examples to avoid: groceries, vacuuming, gardening, golfing):             - For one week with a procedure through the groin.             - For six weeks for procedures through the chest wall or neck. NOTE: You will typically see one of our providers 7-14 days after your procedure to discuss WHEN TO RESUME the above activities.      DRIVING  Do not drive until you are seen for follow up and cleared by a provider. Generally, we ask patient to not drive for 1 week after their procedure.  If you have been told by your doctor in the past that you may not drive, you must talk with him/her before you begin driving again.   DRESSING  Groin site: you may leave the clear dressing over the site for up to one week or until it falls off.   HYGIENE  If you had a femoral (leg) procedure, you may take a shower when you return home. After the shower, pat the site dry. Do NOT use powder, oils or lotions in your groin area until the site has completely healed.  If you had a chest procedure, you may shower when you return home unless specifically instructed not to by your discharging practitioner.             - DO NOT scrub incision; pat dry with a towel.             - DO NOT apply any lotions, oils, powders to the incision.             - No tub baths / swimming for at least 2 weeks.  If you notice any fevers, chills, increased pain, swelling, bleeding or pus, please contact your doctor.   ADDITIONAL INFORMATION  If you are going to have an upcoming dental procedure, please contact our office as you will require antibiotics ahead of time to prevent infection on your heart valve.    If you have any questions  or concerns you can call the structural heart phone during normal business hours 8am-4pm. If you have an urgent need after hours or weekends please call [phone] to talk to the on call provider for general cardiology. If you have an emergency that requires immediate attention, please call 911.    After TAVR Checklist  Check  Test Description   Follow up appointment in 1-2 weeks  You will see our structural heart advanced practice provider. Your incision sites will be checked and you will be cleared to drive and resume all normal activities if you are doing well.     1 month echo and follow up  You will have an echo to check on your new heart valve and be seen back in the office by a structural heart advanced practice provider.   Follow up with your primary cardiologist You will need to be seen by your primary cardiologist in the following 3-6 months after your 1 month appointment in the valve clinic.    1 year echo and follow up You will have another echo to check on your heart valve after 1 year  and be seen back in the office by a structural heart advanced practice provider. This your last structural heart visit.   Bacterial endocarditis prophylaxis  You will have to take antibiotics for the rest of your life before all dental procedures (even teeth cleanings) to protect your heart valve. Antibiotics are also required before some surgeries. Please check with your cardiologist before scheduling any surgeries. Also, please make sure to tell us  if you have a penicillin allergy as you will require an alternative antibiotic.

## 2023-03-21 NOTE — Progress Notes (Signed)
 Mobility Specialist Progress Note:    03/21/23 1111  Mobility  Activity Ambulated with assistance in hallway  Level of Assistance Contact guard assist, steadying assist  Assistive Device None  Distance Ambulated (ft) 300 ft  Activity Response Tolerated well  Mobility Referral Yes  Mobility visit 1 Mobility  Mobility Specialist Start Time (ACUTE ONLY) 1015  Mobility Specialist Stop Time (ACUTE ONLY) 1036  Mobility Specialist Time Calculation (min) (ACUTE ONLY) 21 min   Received pt in bed having no complaints and agreeable to mobility. No physical assistance needed during session. Assisted patient in gown change d/t urinary incontinence. Requested to brush teeth prior to ambulating halls. Pt was asymptomatic throughout ambulation and returned to room w/o fault. Left in bed w/ call bell in reach and all needs met.  Thersia Minder Mobility Specialist  Please contact vis Secure Chat or  Rehab Office [phone]

## 2023-03-21 NOTE — Anesthesia Postprocedure Evaluation (Signed)
 Anesthesia Post Note  Patient: Logan Wolfe  Procedure(s) Performed: Transcatheter Aortic Valve Replacement, Transfemoral INTRAOPERATIVE TRANSTHORACIC ECHOCARDIOGRAM     Patient location during evaluation: PACU Anesthesia Type: MAC Level of consciousness: awake and alert Pain management: pain level controlled Vital Signs Assessment: post-procedure vital signs reviewed and stable Respiratory status: spontaneous breathing, nonlabored ventilation and respiratory function stable Cardiovascular status: stable and blood pressure returned to baseline Anesthetic complications: no   There were no known notable events for this encounter.  Last Vitals:  Vitals:   03/21/23 0423 03/21/23 0750  BP: (!) 148/65 131/73  Pulse:  (!) 57  Resp: 16 17  Temp:  36.6 C  SpO2:      Last Pain:  Vitals:   03/21/23 0750  TempSrc: Oral  PainSc:                  Debby FORBES Like

## 2023-03-21 NOTE — Progress Notes (Signed)
 CARDIAC REHAB PHASE I     Post TAVR education including site care, restrictions, risk factors, heart healthy diet, exercise guidelines and CRP2 reviewed. All questions and concerns addressed. Will refer to [facility] for CRP2. Plan for discharge home later today.   [id number] Vaughn Asberry Hacking, RN BSN 03/21/2023 11:31 AM

## 2023-03-21 NOTE — Progress Notes (Signed)
 Discharge instructions reviewed with pt and his family at bedside.  Copy of instructions given to pt. No new scripts, no changes in medications.  Family assisting pt getting dressed at this time.  Pt will be d/c'd via wheelchair with belongings, with family and will be          escorted by hospital volunteers.   Leannah Guse,RN SWOT

## 2023-03-21 NOTE — Plan of Care (Signed)

## 2023-03-21 NOTE — Progress Notes (Signed)
ZIO AT applied at hospital Dr. Excell Seltzer to read.

## 2023-03-21 NOTE — Progress Notes (Signed)
      [address]       [place] [address]             [phone]      1 Day Post-Op  Procedure(s) (LRB): Transcatheter Aortic Valve Replacement, Transfemoral (N/A) INTRAOPERATIVE TRANSTHORACIC ECHOCARDIOGRAM (N/A)   Total Length of Stay:  LOS: 1 day    SUBJECTIVE: Didn't sleep well No other complaints  Vitals:   03/21/23 0422 03/21/23 0423  BP:  (!) 148/65  Pulse:    Resp: 16 16  Temp:    SpO2:      Intake/Output      02/04 0701 02/05 0700 02/05 0701 02/06 0700   P.O. 180    I.V. (mL/kg) 1150.3 (11.1)    Blood 615    IV Piggyback 200    Total Intake(mL/kg) 2145.3 (20.7)    Urine (mL/kg/hr) 1900    Stool 1    Blood 50    Total Output 1951    Net +194.3             sodium chloride      nitroGLYCERIN  Stopped (03/20/23 1721)    CBC    Component Value Date/Time   WBC 4.8 03/21/2023 0340   RBC 2.76 (L) 03/21/2023 0340   HGB 8.5 (L) 03/21/2023 0340   HGB 9.8 (L) 02/02/2023 1124   HCT 25.3 (L) 03/21/2023 0340   HCT 28.6 (L) 02/02/2023 1124   PLT 168 03/21/2023 0340   PLT 226 02/02/2023 1124   MCV 91.7 03/21/2023 0340   MCV 99 (H) 02/02/2023 1124   MCH 30.8 03/21/2023 0340   MCHC 33.6 03/21/2023 0340   RDW 13.4 03/21/2023 0340   RDW 13.0 02/02/2023 1124   LYMPHSABS 2.4 03/04/2022 1430   MONOABS 1.1 (H) 03/04/2022 1430   EOSABS 0.0 03/04/2022 1430   BASOSABS 0.0 03/04/2022 1430   CMP     Component Value Date/Time   NA 138 03/21/2023 0340   NA 140 02/02/2023 1124   K 3.7 03/21/2023 0340   CL 104 03/21/2023 0340   CO2 24 03/21/2023 0340   GLUCOSE 100 (H) 03/21/2023 0340   BUN 17 03/21/2023 0340   BUN 20 02/02/2023 1124   CREATININE 1.38 (H) 03/21/2023 0340   CALCIUM  8.8 (L) 03/21/2023 0340   PROT 6.6 03/16/2023 0949   PROT 6.2 01/29/2023 0827   ALBUMIN 3.8 03/16/2023 0949   ALBUMIN 4.2 01/29/2023 0827   AST 17 03/16/2023 0949   ALT 12 03/16/2023 0949   ALKPHOS 55 03/16/2023 0949   BILITOT 0.3 03/16/2023 0949    BILITOT <0.2 01/29/2023 0827   GFRNONAA 52 (L) 03/21/2023 0340   GFRAA >60 02/19/2017 1702   ABG    Component Value Date/Time   TCO2 22 03/20/2023 1521   CBG (last 3)  No results for input(s): GLUCAP in the last 72 hours. EXAM Lungs: clear Card: RR very soft murmur Ext: warm Neuro:intact  ASSESSMENT: SP TAVR Doing well. Has new LBBB. Discuss need for EP to determine if any other monitoring needed prior to dc Echo today Up and ambutlating Anemia: Hct improved with blood transfusion. Need to follow secondary to history of GIB Hopfully home later today   Deward Kallman, MD 03/21/2023

## 2023-03-21 NOTE — Progress Notes (Signed)
 Zio monitor placed on patient.

## 2023-03-22 ENCOUNTER — Telehealth: Payer: Self-pay | Admitting: Physician Assistant

## 2023-03-22 NOTE — Telephone Encounter (Signed)
  [facility]ENTER   MULTIDISCIPLINARY HEART VALVE TEAM   Patient contacted regarding discharge from [facility] on 2/5  Patient understands to follow up with a structural heart APP on 2/14 at [address].  Patient understands discharge instructions? yes Patient understands medications and regimen? yes Patient understands to bring all medications to this visit? Yes  Does note a little Headache but otherwise doing well.   Lamarr Hummer PA-C  [facility]

## 2023-03-22 NOTE — Telephone Encounter (Signed)
 Attempted TOC call. Left VM to call back.   Cline Crock PA-C  [facility]

## 2023-03-26 ENCOUNTER — Telehealth (HOSPITAL_COMMUNITY): Payer: Self-pay

## 2023-03-26 NOTE — Telephone Encounter (Signed)
 Attempted to call patient in regards to Cardiac Rehab - unable to leave VM, VM box full.

## 2023-03-29 NOTE — Progress Notes (Addendum)
 HEART AND VASCULAR CENTER   MULTIDISCIPLINARY HEART VALVE CLINIC                                     Cardiology Office Note:    Date:  03/30/2023   ID:  Logan Wolfe, DOB 09/28/1945, MRN [id number]  PCP:  Logan Floro, MD  [facility] Cardiologist:  Logan Bollman, MD  [facility] Electrophysiologist:  None   Referring MD: Logan Floro, MD   [facility] s/p TAVR  History of Present Illness:    Logan Wolfe is a 78 y.o. 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  He was admitted 11/25-11/27/24 for NSTEMI in the setting of acute GI bleed. HStop peaked at 1921. Cath 11/26 showed an 80% RCA focal stenosis proximal to bifurcation, and subtotally occluded small PDA branch likely the culprit of NSTEMI. Medical management was recommended. He required transfusion for acute anemia (hg down to 7.8) related to GI bleeding. Colonoscopy 01/10/23 with external/internal hemorrhoids, multiple bleeding colonic angioectasias treated with APC. 2D echo showed EF 60-65% with severe paradoxical LFLG AS with a mean gradient of 29 mm hg, AVA 0.95 cm2, mild to mod AI, mod MAC with mild MR, and moderate pulm htn. Aortic valve interrogation on cath showed a mean gradient of 47 mm hg. He was discharged home with plans for outpatient TAVR work up.   Underwent successful TAVR with a 26 mm Edwards Sapien 3 Ultra Resilia THV via the TF approach on 03/20/23. Hg dropped to 6.8 and he was treated with 1U PRBCs. He was treated with one dose of IV Lasix for an elevated LVEDP ~28 mmhg. He was discharged home with a Zio AT given new LBBB after TAVR.   Today the patient presents to clinic for follow up. He does feel better since his valve surgery with more energy and can more easily go up and down the stairs to his  office. No CP or SOB. No LE edema, orthopnea or PND. No dizziness or syncope. Has not has any blood in the stool for at least 10 days. No palpitations.     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 Excell Wolfe & Dr. Leafy Wolfe   Severe aortic stenosis    Spinal stenosis    Unspecified pruritic disorder      Current Medications: Current Meds  Medication Sig   apalutamide (ERLEADA) 60 MG tablet Take 240 mg by mouth daily. Take 4 tabs daily   aspirin EC 81 MG tablet Take 81 mg by mouth daily. Swallow whole.   azithromycin (ZITHROMAX) 500 MG tablet Take 1 tablet (500 mg total) by mouth as directed. Take one tablet 1 hour before any dental work including cleanings.   Calcium Carb-Cholecalciferol (  CALCIUM 600 + D PO) Take 2 tablets by mouth daily.   calcium carbonate (TUMS - DOSED IN MG ELEMENTAL CALCIUM) 500 MG chewable tablet Chew 1 tablet by mouth daily as needed for indigestion or heartburn.   cyanocobalamin (VITAMIN B12) 1000 MCG tablet Take 1,000 mcg by mouth daily.   dextromethorphan-guaiFENesin (MUCINEX DM) 30-600 MG 12hr tablet Take 1 tablet by mouth every 4 (four) hours as needed for cough.   fluticasone (FLONASE) 50 MCG/ACT nasal spray Place 2 sprays into both nostrils daily as needed for allergies.   iron polysaccharides (NIFEREX) 150 MG capsule Take 150 mg by mouth 2 (two) times daily.   loperamide (IMODIUM A-D) 2 MG tablet Take 2 mg by mouth 4 (four) times daily as needed for diarrhea or loose stools.   Melatonin 10 MG CHEW Chew 20 mg by mouth at bedtime. May take another 10 mg dose during the middle of the  night as needed for sleep   metoprolol succinate (TOPROL-XL) 50 MG 24 hr tablet Take 1 tablet (50 mg total) by mouth daily. Take with or immediately following a meal.   PARoxetine (PAXIL) 20 MG tablet Take 20 mg by mouth every morning.   relugolix (ORGOVYX) 120 MG tablet Take 240 mg by mouth daily.      ROS:   Please see the history of present illness.    All other systems reviewed and are negative.  EKGs   EKG Interpretation Date/Time:  Friday March 30 2023 10:00:52 EST Ventricular Rate:  51 PR Interval:  196 QRS Duration:  178 QT Interval:  528 QTC Calculation: 486 R Axis:   -12  Text Interpretation: Sinus bradycardia Left bundle branch block Confirmed by Logan Wolfe [id number]) on 03/30/2023 10:09:58 AM   Risk Assessment/Calculations:           Physical Exam:    VS:  BP 118/60 (BP Location: Right Arm)   Pulse (!) 51   Ht 6\' 1"  (1.854 m)   Wt 229 lb (103.9 kg)   SpO2 98%   BMI 30.21 kg/m     Wt Readings from Last 3 Encounters:  03/30/23 229 lb (103.9 kg)  03/21/23 228 lb 9.6 oz (103.7 kg)  03/01/23 233 lb (105.7 kg)     GEN: Well nourished, well developed in no acute distress NECK: No JVD CARDIAC: RRR, no murmurs, rubs, gallops RESPIRATORY:  Clear to auscultation without rales, wheezing or rhonchi  ABDOMEN: Soft, non-tender, non-distended EXTREMITIES:  No edema; No deformity.  Groin sites clear without hematoma or ecchymosis.   ASSESSMENT:    1. S/P TAVR (transcatheter aortic valve replacement)   2. LBBB (left bundle branch block)   3. Chronic heart failure with preserved ejection fraction (HCC)   4. Coronary artery disease of native artery of native heart with stable angina pectoris (HCC)   5. Iron deficiency anemia due to chronic blood loss   6. CKD stage 3a, GFR 45-59 ml/min (HCC)   7. 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HCC)     PLAN:    In order of problems listed above:  Severe AS s/p TAVR: pt doing well s/p TAVR.  -- ECG with no HAVB.   -- Groin sites healing well.  -- SBE prophylaxis discussed; I have RX'd azithromycin due to a PCN allergy.  -- Continue aspirin 81mg  daily. -- I will see back for 1 month echo and OV.   New LBBB: persistent on ECG today.  -- No high risk alerts on Zio. We removed this today and  sent back to company.    HFpEF: appears euvolemic on no diuretic therapy.   CAD: recent NSTEMI in 12/2022. -- Cath 01/09/23 showed an 80% focal RCA stenosis proximal to bifurcation, and subtotally occluded small PDA branch likely the culprit of NSTEMI. Plan medical management -- Continue ASA 81mg  daily, Toprol XL 50mg  daily and Crestor 20mg  daily    Acute blood loss anemia 2/2 GIB: Hg noted to drop to 6.8 during recent admission and treated with 1UPRBCs with improvement to Hg 8.5. -- Recent colonoscopy 01/10/23 with external/internal hemorrhoids, multiple bleeding colonic angioectasias treated with APC. Followed by Dr. Claretha Cooper -- CBC today.    CKD stage IIIa: creat 1.38 with a GFR of 52 on the day of discharge. -- BMET today     Hx of metastatic prostate CA: follows with [facility], s/p resection/radiation and on hormonal therapy.     Cardiac Rehabilitation Eligibility Assessment  The patient is ready to start cardiac rehabilitation from a cardiac standpoint.    Medication Adjustments/Labs and Tests Ordered: Current medicines are reviewed at length with the patient today.  Concerns regarding medicines are outlined above.  Orders Placed This Encounter  Procedures   Basic Metabolic Panel (BMET)   CBC   EKG 12-Lead   Meds ordered this encounter  Medications   azithromycin (ZITHROMAX) 500 MG tablet    Sig: Take 1 tablet (500 mg total) by mouth as directed. Take one tablet 1 hour before any dental work including cleanings.    Dispense:  6 tablet    Refill:  12    Supervising Provider:   Tonny Wolfe [[id number]]    Patient Instructions  Medication Instructions:  Start taking azithromycin 500 mg,  1 tablet 1 hour prior to all dental appointments. *If you need a refill on your cardiac medications before your next appointment, please call your pharmacy*  Lab Work: BMET, CBC-TODAY You will need to go to any [facility] for these labs. There is a [facility] in our building on the 1st floor or you can call [phone] or visit [email or web address] to find a lab near you. - You do NOT need an appointment for this. You do NOT need to be fasting. We NO LONGER have a lab located in our office.  If you have labs (blood work) drawn today and your tests are completely normal, you will receive your results only by: MyChart Message (if you have MyChart) OR A paper copy in the mail If you have any lab test that is abnormal or we need to change your treatment, we will call you to review the results.  Follow-Up: At [facility], you and your health needs are our priority.  As part of our continuing mission to provide you with exceptional heart care, we have created designated Provider Care Teams.  These Care Teams include your primary Cardiologist (physician) and Advanced Practice Providers (APPs -  Physician Assistants and Nurse Practitioners) who all work together to provide you with the care you need, when you need it.   Your next appointment:   As scheduled     Signed, Logan Crock, PA-C  03/30/2023 4:33 PM    [facility]

## 2023-03-30 ENCOUNTER — Ambulatory Visit: Payer: HMO | Attending: Cardiology | Admitting: Physician Assistant

## 2023-03-30 VITALS — BP 118/60 | HR 51 | Ht 73.0 in | Wt 229.0 lb

## 2023-03-30 DIAGNOSIS — I5032 Chronic diastolic (congestive) heart failure: Secondary | ICD-10-CM | POA: Diagnosis not present

## 2023-03-30 DIAGNOSIS — D500 Iron deficiency anemia secondary to blood loss (chronic): Secondary | ICD-10-CM | POA: Diagnosis not present

## 2023-03-30 DIAGNOSIS — I25118 Atherosclerotic heart disease of native coronary artery with other forms of angina pectoris: Secondary | ICD-10-CM | POA: Diagnosis not present

## 2023-03-30 DIAGNOSIS — I447 Left bundle-branch block, unspecified: Secondary | ICD-10-CM | POA: Diagnosis not present

## 2023-03-30 DIAGNOSIS — C61 Malignant neoplasm of prostate: Secondary | ICD-10-CM | POA: Diagnosis not present

## 2023-03-30 DIAGNOSIS — N1831 Chronic kidney disease, stage 3a: Secondary | ICD-10-CM

## 2023-03-30 DIAGNOSIS — Z952 Presence of prosthetic heart valve: Secondary | ICD-10-CM | POA: Diagnosis not present

## 2023-03-30 MED ORDER — AZITHROMYCIN 500 MG PO TABS: 500.0000 mg | ORAL_TABLET | ORAL | 12 refills | Status: DC

## 2023-03-30 NOTE — Patient Instructions (Signed)
Medication Instructions:  Start taking azithromycin 500 mg, 1 tablet 1 hour prior to all dental appointments. *If you need a refill on your cardiac medications before your next appointment, please call your pharmacy*  Lab Work: BMET, CBC-TODAY You will need to go to any [facility] for these labs. There is a [facility] in our building on the 1st floor or you can call [phone] or visit [email or web address] to find a lab near you. - You do NOT need an appointment for this. You do NOT need to be fasting. We NO LONGER have a lab located in our office.  If you have labs (blood work) drawn today and your tests are completely normal, you will receive your results only by: MyChart Message (if you have MyChart) OR A paper copy in the mail If you have any lab test that is abnormal or we need to change your treatment, we will call you to review the results.  Follow-Up: At [facility], you and your health needs are our priority.  As part of our continuing mission to provide you with exceptional heart care, we have created designated Provider Care Teams.  These Care Teams include your primary Cardiologist (physician) and Advanced Practice Providers (APPs -  Physician Assistants and Nurse Practitioners) who all work together to provide you with the care you need, when you need it.   Your next appointment:   As scheduled

## 2023-03-31 LAB — BASIC METABOLIC PANEL
BUN/Creatinine Ratio: 16 (ref 10–24)
BUN: 20 mg/dL (ref 8–27)
CO2: 24 mmol/L (ref 20–29)
Calcium: 9.8 mg/dL (ref 8.6–10.2)
Chloride: 101 mmol/L (ref 96–106)
Creatinine, Ser: 1.28 mg/dL — ABNORMAL HIGH (ref 0.76–1.27)
Glucose: 97 mg/dL (ref 70–99)
Potassium: 4.7 mmol/L (ref 3.5–5.2)
Sodium: 140 mmol/L (ref 134–144)
eGFR: 57 mL/min/{1.73_m2} — ABNORMAL LOW (ref >59)

## 2023-03-31 LAB — CBC
Hematocrit: 30.8 % — ABNORMAL LOW (ref 37.5–51.0)
Hemoglobin: 9.7 g/dL — ABNORMAL LOW (ref 13.0–17.7)
MCH: 29.6 pg (ref 26.6–33.0)
MCHC: 31.5 g/dL (ref 31.5–35.7)
MCV: 94 fL (ref 79–97)
Platelets: 180 10*3/uL (ref 150–450)
RBC: 3.28 x10E6/uL — ABNORMAL LOW (ref 4.14–5.80)
RDW: 12.7 % (ref 11.6–15.4)
WBC: 4.1 10*3/uL (ref 3.4–10.8)

## 2023-04-04 ENCOUNTER — Telehealth (HOSPITAL_COMMUNITY): Payer: Self-pay

## 2023-04-04 ENCOUNTER — Other Ambulatory Visit: Payer: Self-pay | Admitting: Interventional Cardiology

## 2023-04-04 NOTE — Telephone Encounter (Signed)
Pt insurance is active and benefits verified through [employer]. Co-pay $5.00, DED $0.00/$0.00 met, out of pocket $3,400.00/$160.00 met, co-insurance 0%. No pre-authorization required. Valerie/HTA, 04/04/23 @ 2:59PM, ZOX#[id number]   How many CR sessions are covered? (36 visits for TCR, 72 visits for ICR)72 Is this a lifetime maximum or an annual maximum? Annual Has the member used any of these services to date? No Is there a time limit (weeks/months) on start of program and/or program completion? No

## 2023-04-04 NOTE — Telephone Encounter (Signed)
Called patient to see if he was interested in participating in the Cardiac Rehab Program. Patient will come in for orientation on 04/05/23 @ 10:30AM and will attend the 11:45AM exercise class.   Pensions consultant.

## 2023-04-05 ENCOUNTER — Inpatient Hospital Stay (HOSPITAL_COMMUNITY): Admission: RE | Admit: 2023-04-05 | Payer: HMO | Source: Ambulatory Visit

## 2023-04-05 ENCOUNTER — Telehealth (HOSPITAL_COMMUNITY): Payer: Self-pay

## 2023-04-05 NOTE — Telephone Encounter (Signed)
Called pt regarding orientation appointment today at 10:30. Pt stated he was unable to come due to winter weather but would like to reschedule for early next week.   Jonna Coup, MS, ACSM-CEP 04/05/2023 10:40 AM

## 2023-04-09 ENCOUNTER — Ambulatory Visit (HOSPITAL_COMMUNITY): Payer: HMO

## 2023-04-11 ENCOUNTER — Ambulatory Visit (HOSPITAL_COMMUNITY): Payer: HMO

## 2023-04-13 ENCOUNTER — Ambulatory Visit: Payer: HMO | Attending: Cardiovascular Disease

## 2023-04-13 ENCOUNTER — Other Ambulatory Visit (HOSPITAL_COMMUNITY): Payer: Self-pay | Admitting: Physician Assistant

## 2023-04-13 ENCOUNTER — Ambulatory Visit (HOSPITAL_COMMUNITY): Payer: HMO

## 2023-04-13 ENCOUNTER — Ambulatory Visit: Payer: HMO | Admitting: Physician Assistant

## 2023-04-13 VITALS — BP 126/68 | HR 67 | Ht 73.0 in | Wt 226.0 lb

## 2023-04-13 DIAGNOSIS — I5032 Chronic diastolic (congestive) heart failure: Secondary | ICD-10-CM | POA: Insufficient documentation

## 2023-04-13 DIAGNOSIS — I25118 Atherosclerotic heart disease of native coronary artery with other forms of angina pectoris: Secondary | ICD-10-CM | POA: Insufficient documentation

## 2023-04-13 DIAGNOSIS — Z952 Presence of prosthetic heart valve: Secondary | ICD-10-CM

## 2023-04-13 DIAGNOSIS — I447 Left bundle-branch block, unspecified: Secondary | ICD-10-CM

## 2023-04-13 DIAGNOSIS — I050 Rheumatic mitral stenosis: Secondary | ICD-10-CM | POA: Diagnosis present

## 2023-04-13 DIAGNOSIS — D500 Iron deficiency anemia secondary to blood loss (chronic): Secondary | ICD-10-CM | POA: Insufficient documentation

## 2023-04-13 DIAGNOSIS — N1831 Chronic kidney disease, stage 3a: Secondary | ICD-10-CM | POA: Insufficient documentation

## 2023-04-13 DIAGNOSIS — C61 Malignant neoplasm of prostate: Secondary | ICD-10-CM | POA: Insufficient documentation

## 2023-04-13 LAB — ECHOCARDIOGRAM COMPLETE
AV Mean grad: 8.3 mm[Hg]
AV Peak grad: 14.5 mm[Hg]
Ao pk vel: 1.9 m/s
Area-P 1/2: 3.29 cm2
P 1/2 time: 452 ms
S' Lateral: 2.8 cm

## 2023-04-13 MED ORDER — METOPROLOL SUCCINATE ER 50 MG PO TB24: 50.0000 mg | ORAL_TABLET | Freq: Every day | ORAL | 3 refills | Status: AC

## 2023-04-13 NOTE — Progress Notes (Addendum)
 [facility]   MULTIDISCIPLINARY HEART VALVE CLINIC                                     Cardiology Office Note:    Date:  04/13/2023   ID:  Logan Wolfe, DOB 02-26-1945, MRN [id number]  PCP:  Daisy Floro, MD  [facility] Cardiologist:  Tonny Bollman, MD  [facility] Electrophysiologist:  None   Referring MD: Daisy Floro, MD   1 month s/p TAVR  History of Present Illness:    Logan Wolfe is a 78 y.o. 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  He was admitted 11/25-11/27/24 for NSTEMI in the setting of acute GI bleed. HStop peaked at 1921. Cath 11/26 showed an 80% RCA focal stenosis proximal to bifurcation, and subtotally occluded small PDA branch likely the culprit of NSTEMI. Medical management was recommended. He required transfusion for acute anemia (hg down to 7.8) related to GI bleeding. Colonoscopy 01/10/23 with external/internal hemorrhoids, multiple bleeding colonic angioectasias treated with APC. 2D echo showed EF 60-65% with severe paradoxical LFLG AS with a mean gradient of 29 mm hg, AVA 0.95 cm2, mild to mod AI, mod MAC with mild MR, and moderate pulm htn. Aortic valve interrogation on cath showed a mean gradient of 47 mm hg. S/p TAVR with a 26 mm Edwards Sapien 3 Ultra Resilia THV via the TF approach on 03/20/23. Hg dropped to 6.8 and he was treated with 1U PRBCs. He was treated with one dose of IV Lasix for an elevated LVEDP ~28 mmhg. He was discharged home with a Zio AT given new LBBB after TAVR. Labs 03/30/23 showed Hg improved to 9.7.  Today the patient presents to clinic for follow up. Here with his son. He has had a tough week as he lost his wife on Tuesday. She had lung complications from amiodarone use and passed away in the hospital with  PNA. He is devastated and worried it could affect his heart valve. No CP or SOB. No LE edema, orthopnea or PND. No dizziness or syncope. No blood in stool for at least 2 weeks. No palpitations.   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 Excell Seltzer & Dr. Leafy Ro   Severe aortic stenosis    Spinal stenosis    Unspecified pruritic disorder      Current Medications: Current Meds  Medication Sig   apalutamide (ERLEADA) 60 MG tablet Take 240 mg by mouth daily. Take 4 tabs daily   aspirin EC 81 MG tablet Take 81 mg by mouth daily. Swallow whole.   azithromycin (ZITHROMAX) 500 MG tablet Take 1 tablet (500 mg total) by mouth as directed. Take one tablet 1 hour before any dental work  including cleanings.   Calcium Carb-Cholecalciferol (CALCIUM 600 + D PO) Take 2 tablets by mouth daily.   calcium carbonate (TUMS - DOSED IN MG ELEMENTAL CALCIUM) 500 MG chewable tablet Chew 1 tablet by mouth daily as needed for indigestion or heartburn.   cyanocobalamin (VITAMIN B12) 1000 MCG tablet Take 1,000 mcg by mouth daily.   dextromethorphan-guaiFENesin (MUCINEX DM) 30-600 MG 12hr tablet Take 1 tablet by mouth every 4 (four) hours as needed for cough.   fluticasone (FLONASE) 50 MCG/ACT nasal spray Place 2 sprays into both nostrils daily as needed for allergies.   iron polysaccharides (NIFEREX) 150 MG capsule Take 150 mg by mouth 2 (two) times daily.   loperamide (IMODIUM A-D) 2 MG tablet Take 2 mg by mouth 4 (four) times daily as needed for diarrhea or loose stools.   Melatonin 10 MG CHEW Chew 20 mg by mouth at bedtime. May take  another 10 mg dose during the middle of the night as needed for sleep   PARoxetine (PAXIL) 20 MG tablet Take 20 mg by mouth every morning.   relugolix (ORGOVYX) 120 MG tablet Take 240 mg by mouth daily.   [DISCONTINUED] metoprolol succinate (TOPROL-XL) 50 MG 24 hr tablet TAKE 1 TABLET(50 MG) BY MOUTH DAILY WITH OR IMMEDIATELY FOLLOWING A MEAL      ROS:   Please see the history of present illness.    All other systems reviewed and are negative.  EKGs       Risk Assessment/Calculations:           Physical Exam:    VS:  BP 126/68   Pulse 67   Ht 6\' 1"  (1.854 m)   Wt 226 lb (102.5 kg)   SpO2 98%   BMI 29.82 kg/m     Wt Readings from Last 3 Encounters:  04/13/23 226 lb (102.5 kg)  03/30/23 229 lb (103.9 kg)  03/21/23 228 lb 9.6 oz (103.7 kg)     GEN: Well nourished, well developed in no acute distress NECK: No JVD CARDIAC: RRR, soft flow murmur @ RUSB. No rubs, gallops RESPIRATORY:  Clear to auscultation without rales, wheezing or rhonchi  ABDOMEN: Soft, non-tender, non-distended EXTREMITIES:  No edema; No deformity.    ASSESSMENT:    1. S/P TAVR (transcatheter aortic valve replacement)   2. LBBB (left bundle branch block)   3. Chronic heart failure with preserved ejection fraction (HCC)   4. Coronary artery disease of native artery of native heart with stable angina pectoris (HCC)   5. Iron deficiency anemia due to chronic blood loss   6. CKD stage 3a, GFR 45-59 ml/min (HCC)   7. 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HCC)   8. Mitral valve stenosis, unspecified etiology     PLAN:    In order of problems listed above:  Severe AS s/p TAVR:  -- Echo today shows EF 55%, normally functioning TAVR with a mean gradient of 8.2 mm hg and mild PVL as well as moderate MS with a mean gradient of 7 mm hg.  -- NYHA class I symptoms.  -- SBE prophylaxis discussed; he has azithromycin due to a PCN allergy.  -- Continue aspirin 81mg  daily. -- I will see back for 1 year echo  and OV.   New LBBB:  -- New after TAVR. -- No HAVB on Zio   HFpEF:  -- Appears euvolemic today -- Not on any standing diuretics   CAD:  -- Recent NSTEMI in the setting of GI bleeding in 12/2022. -- Cath  01/09/23 showed an 80% focal RCA stenosis proximal to bifurcation, and subtotally occluded small PDA branch likely the culprit of NSTEMI. Plan medical management -- Continue ASA 81mg  daily, Toprol XL 50mg  daily and Crestor 20mg  daily    Acute blood loss anemia 2/2 GIB:  -- Hg dropped to 6.8 requiring transfusion during TAVR admission. -- Colonoscopy 01/10/23 with external/internal hemorrhoids, multiple bleeding colonic angioectasias treated with APC.  -- Followed by Dr. Claretha Cooper with GI. -- Labs 03/30/23 showed Hg improved to 9.7 (personally reviewed)   CKD stage IIIa:  -- Creat baseline 1.28-1.5 (personally reviewed).   Hx of metastatic prostate CA:  -- S/p resection/radiation and on hormonal therapy. -- Follows with [facility]  Moderate MS:  -- Noted on echo today with a mean gradient of .  -- Will follow over time.  Medication Adjustments/Labs and Tests Ordered: Current medicines are reviewed at length with the patient today.  Concerns regarding medicines are outlined above.  No orders of the defined types were placed in this encounter.  Meds ordered this encounter  Medications   metoprolol succinate (TOPROL-XL) 50 MG 24 hr tablet    Sig: Take 1 tablet (50 mg total) by mouth daily. Take with or immediately following a meal.    Dispense:  90 tablet    Refill:  3    Patient Instructions  Medication Instructions:   Your physician recommends that you continue on your current medications as directed. Please refer to the Current Medication list given to you today.   *If you need a refill on your cardiac medications before your next appointment, please call your pharmacy*   Lab Work:  None ordered.  If you have labs (blood work) drawn today and your tests  are completely normal, you will receive your results only by: MyChart Message (if you have MyChart) OR A paper copy in the mail If you have any lab test that is abnormal or we need to change your treatment, we will call you to review the results.   Testing/Procedures:  None ordered.   Follow-Up: At [facility], you and your health needs are our priority.  As part of our continuing mission to provide you with exceptional heart care, we have created designated Provider Care Teams.  These Care Teams include your primary Cardiologist (physician) and Advanced Practice Providers (APPs -  Physician Assistants and Nurse Practitioners) who all work together to provide you with the care you need, when you need it.  We recommend signing up for the patient portal called "MyChart".  Sign up information is provided on this After Visit Summary.  MyChart is used to connect with patients for Virtual Visits (Telemedicine).  Patients are able to view lab/test results, encounter notes, upcoming appointments, etc.  Non-urgent messages can be sent to your provider as well.   To learn more about what you can do with MyChart, go to [email or web address].    Your next appointment:   5 month(s)  Provider:   Carlean Jews, PA-C    Other Instructions    1st Floor: - Lobby - Registration  - Pharmacy  - Lab - Cafe  2nd Floor: - PV Lab - Diagnostic Testing (echo, CT, nuclear med)  3rd Floor: - Vacant  4th Floor: - TCTS (cardiothoracic surgery) - AFib Clinic - Structural Heart Clinic - Vascular Surgery  - Vascular Ultrasound  5th Floor: - HeartCare Cardiology (general and EP) - Clinical Pharmacy for coumadin, hypertension, lipid, weight-loss medications, and med management appointments  Valet parking services will be available as well.         Signed, Cline Crock, PA-C  04/13/2023 2:40 PM    [facility]

## 2023-04-13 NOTE — Patient Instructions (Signed)
 Medication Instructions:   Your physician recommends that you continue on your current medications as directed. Please refer to the Current Medication list given to you today.   *If you need a refill on your cardiac medications before your next appointment, please call your pharmacy*   Lab Work:  None ordered.  If you have labs (blood work) drawn today and your tests are completely normal, you will receive your results only by: MyChart Message (if you have MyChart) OR A paper copy in the mail If you have any lab test that is abnormal or we need to change your treatment, we will call you to review the results.   Testing/Procedures:  None ordered.   Follow-Up: At [facility], you and your health needs are our priority.  As part of our continuing mission to provide you with exceptional heart care, we have created designated Provider Care Teams.  These Care Teams include your primary Cardiologist (physician) and Advanced Practice Providers (APPs -  Physician Assistants and Nurse Practitioners) who all work together to provide you with the care you need, when you need it.  We recommend signing up for the patient portal called "MyChart".  Sign up information is provided on this After Visit Summary.  MyChart is used to connect with patients for Virtual Visits (Telemedicine).  Patients are able to view lab/test results, encounter notes, upcoming appointments, etc.  Non-urgent messages can be sent to your provider as well.   To learn more about what you can do with MyChart, go to [email or web address].    Your next appointment:   5 month(s)  Provider:   Carlean Jews, PA-C    Other Instructions    1st Floor: - Lobby - Registration  - Pharmacy  - Lab - Cafe  2nd Floor: - PV Lab - Diagnostic Testing (echo, CT, nuclear med)  3rd Floor: - Vacant  4th Floor: - TCTS (cardiothoracic surgery) - AFib Clinic - Structural Heart Clinic - Vascular Surgery  - Vascular  Ultrasound  5th Floor: - HeartCare Cardiology (general and EP) - Clinical Pharmacy for coumadin, hypertension, lipid, weight-loss medications, and med management appointments    Valet parking services will be available as well.

## 2023-04-16 ENCOUNTER — Ambulatory Visit (HOSPITAL_COMMUNITY): Payer: HMO

## 2023-04-17 DIAGNOSIS — D500 Iron deficiency anemia secondary to blood loss (chronic): Secondary | ICD-10-CM | POA: Diagnosis not present

## 2023-04-17 DIAGNOSIS — D649 Anemia, unspecified: Secondary | ICD-10-CM | POA: Diagnosis not present

## 2023-04-18 ENCOUNTER — Ambulatory Visit (HOSPITAL_COMMUNITY): Payer: HMO

## 2023-04-20 ENCOUNTER — Ambulatory Visit (HOSPITAL_COMMUNITY): Payer: HMO

## 2023-04-23 ENCOUNTER — Ambulatory Visit (HOSPITAL_COMMUNITY): Payer: HMO

## 2023-04-25 ENCOUNTER — Ambulatory Visit (HOSPITAL_COMMUNITY): Payer: HMO

## 2023-04-27 ENCOUNTER — Ambulatory Visit (HOSPITAL_COMMUNITY): Payer: HMO

## 2023-04-30 ENCOUNTER — Ambulatory Visit (HOSPITAL_COMMUNITY): Payer: HMO

## 2023-05-02 ENCOUNTER — Telehealth (HOSPITAL_COMMUNITY): Payer: Self-pay

## 2023-05-02 ENCOUNTER — Ambulatory Visit (HOSPITAL_COMMUNITY): Payer: HMO

## 2023-05-02 DIAGNOSIS — D500 Iron deficiency anemia secondary to blood loss (chronic): Secondary | ICD-10-CM | POA: Diagnosis not present

## 2023-05-02 NOTE — Telephone Encounter (Signed)
 Called pt regarding rescheduling his cancelled classes. Pt stated he isn't ready yet and needs more time, he will let us know when he's ready to reschedule.  Close referral.

## 2023-05-03 ENCOUNTER — Telehealth: Payer: Self-pay | Admitting: Physician Assistant

## 2023-05-03 NOTE — Telephone Encounter (Signed)
 Pt's medication was already sent to pt's pharmacy as requested. Confirmation received.

## 2023-05-03 NOTE — Telephone Encounter (Signed)
*  STAT* If patient is at the pharmacy, call can be transferred to refill team.   1. Which medications need to be refilled? (please list name of each medication and dose if known)   metoprolol succinate (TOPROL-XL) 50 MG 24 hr tablet    2. Which pharmacy/location (including street and city if local pharmacy) is medication to be sent to?  [id number] - [place], [place] - [address] AT [facility] [address]      3. Do they need a 30 day or 90 day supply? 90 day    Pt is out of medication

## 2023-05-04 ENCOUNTER — Ambulatory Visit (HOSPITAL_COMMUNITY): Payer: HMO

## 2023-05-07 ENCOUNTER — Ambulatory Visit (HOSPITAL_COMMUNITY): Payer: HMO

## 2023-05-07 ENCOUNTER — Ambulatory Visit: Payer: HMO | Admitting: Cardiology

## 2023-05-09 ENCOUNTER — Ambulatory Visit (HOSPITAL_COMMUNITY): Payer: HMO

## 2023-05-11 ENCOUNTER — Ambulatory Visit (HOSPITAL_COMMUNITY): Payer: HMO

## 2023-05-14 ENCOUNTER — Ambulatory Visit (HOSPITAL_COMMUNITY): Payer: HMO

## 2023-05-16 ENCOUNTER — Ambulatory Visit (HOSPITAL_COMMUNITY): Payer: HMO

## 2023-05-18 ENCOUNTER — Ambulatory Visit (HOSPITAL_COMMUNITY): Payer: HMO

## 2023-05-21 ENCOUNTER — Ambulatory Visit (HOSPITAL_COMMUNITY): Payer: HMO

## 2023-05-23 ENCOUNTER — Ambulatory Visit (HOSPITAL_COMMUNITY): Payer: HMO

## 2023-05-25 ENCOUNTER — Ambulatory Visit (HOSPITAL_COMMUNITY): Payer: HMO

## 2023-05-28 ENCOUNTER — Ambulatory Visit (HOSPITAL_COMMUNITY): Payer: HMO

## 2023-05-30 ENCOUNTER — Ambulatory Visit (HOSPITAL_COMMUNITY): Payer: HMO

## 2023-06-01 ENCOUNTER — Ambulatory Visit (HOSPITAL_COMMUNITY): Payer: HMO

## 2023-06-04 ENCOUNTER — Ambulatory Visit (HOSPITAL_COMMUNITY): Payer: HMO

## 2023-06-06 ENCOUNTER — Ambulatory Visit (HOSPITAL_COMMUNITY): Payer: HMO

## 2023-06-08 ENCOUNTER — Ambulatory Visit (HOSPITAL_COMMUNITY): Payer: HMO

## 2023-06-11 ENCOUNTER — Ambulatory Visit (HOSPITAL_COMMUNITY): Payer: HMO

## 2023-06-11 ENCOUNTER — Ambulatory Visit: Payer: HMO | Admitting: Podiatry

## 2023-06-13 ENCOUNTER — Ambulatory Visit (HOSPITAL_COMMUNITY): Payer: HMO

## 2023-06-15 ENCOUNTER — Ambulatory Visit (HOSPITAL_COMMUNITY): Payer: HMO

## 2023-06-18 ENCOUNTER — Ambulatory Visit (HOSPITAL_COMMUNITY): Payer: HMO

## 2023-06-20 ENCOUNTER — Ambulatory Visit (HOSPITAL_COMMUNITY): Payer: HMO

## 2023-06-22 ENCOUNTER — Ambulatory Visit (HOSPITAL_COMMUNITY): Payer: HMO

## 2023-06-25 ENCOUNTER — Ambulatory Visit (HOSPITAL_COMMUNITY): Payer: HMO

## 2023-06-27 ENCOUNTER — Ambulatory Visit (HOSPITAL_COMMUNITY): Payer: HMO

## 2023-06-27 DIAGNOSIS — C61 Malignant neoplasm of prostate: Secondary | ICD-10-CM | POA: Diagnosis not present

## 2023-07-02 ENCOUNTER — Telehealth: Payer: Self-pay | Admitting: Cardiovascular Disease

## 2023-07-02 DIAGNOSIS — D649 Anemia, unspecified: Secondary | ICD-10-CM | POA: Diagnosis not present

## 2023-07-02 DIAGNOSIS — Z23 Encounter for immunization: Secondary | ICD-10-CM | POA: Diagnosis not present

## 2023-07-02 DIAGNOSIS — Z79899 Other long term (current) drug therapy: Secondary | ICD-10-CM | POA: Diagnosis not present

## 2023-07-02 DIAGNOSIS — Z6830 Body mass index (BMI) 30.0-30.9, adult: Secondary | ICD-10-CM | POA: Diagnosis not present

## 2023-07-02 DIAGNOSIS — F4321 Adjustment disorder with depressed mood: Secondary | ICD-10-CM | POA: Diagnosis not present

## 2023-07-02 DIAGNOSIS — Z01118 Encounter for examination of ears and hearing with other abnormal findings: Secondary | ICD-10-CM | POA: Diagnosis not present

## 2023-07-02 DIAGNOSIS — Z0000 Encounter for general adult medical examination without abnormal findings: Secondary | ICD-10-CM | POA: Diagnosis not present

## 2023-07-02 DIAGNOSIS — E7800 Pure hypercholesterolemia, unspecified: Secondary | ICD-10-CM | POA: Diagnosis not present

## 2023-07-02 NOTE — Telephone Encounter (Signed)
 Spoke with pt and explained Dr. Katheryne Pane recommendations. Pt verbalized understanding and had no further questions at this time.

## 2023-07-02 NOTE — Telephone Encounter (Signed)
 Spoke with pt and he stated he started feeling SOB yesterday while in the shower and it kept on throughout the day. Pt states it is much better today. Pt went to PCP for a scheduled visit today and he said heart and lungs sounded fine. Pt denies any edema or other symptoms present, only SOB. Explained that I would forward this information to Dr. Arlester Ladd and his nurse to review for further guidance.

## 2023-07-02 NOTE — Telephone Encounter (Signed)
 Pt c/o Shortness Of Breath: STAT if SOB developed within the last 24 hours or pt is noticeably SOB on the phone  1. Are you currently SOB (can you hear that pt is SOB on the phone)? No  2. How long have you been experiencing SOB? Just yesterday   3. Are you SOB when sitting or when up moving around? Sitting   4. Are you currently experiencing any other symptoms? No

## 2023-07-02 NOTE — Telephone Encounter (Signed)
 Chart reviewed.  Since he was just seen by his PCP with clear lung fields and clear cardiac exam, and he is feeling better today, I would just have him keep an eye on things.  If his shortness of breath returns or progresses, we should evaluate him in the office.  Thank you

## 2023-07-17 ENCOUNTER — Observation Stay (HOSPITAL_COMMUNITY)

## 2023-07-17 ENCOUNTER — Emergency Department (HOSPITAL_COMMUNITY)

## 2023-07-17 ENCOUNTER — Observation Stay (HOSPITAL_COMMUNITY)
Admission: EM | Admit: 2023-07-17 | Discharge: 2023-07-18 | Disposition: A | Discharge status: Home / Self Care | Attending: Internal Medicine | Admitting: Internal Medicine

## 2023-07-17 ENCOUNTER — Other Ambulatory Visit: Payer: Self-pay

## 2023-07-17 ENCOUNTER — Encounter (HOSPITAL_COMMUNITY): Payer: Self-pay | Admitting: Radiology

## 2023-07-17 DIAGNOSIS — F419 Anxiety disorder, unspecified: Secondary | ICD-10-CM | POA: Diagnosis not present

## 2023-07-17 DIAGNOSIS — K922 Gastrointestinal hemorrhage, unspecified: Secondary | ICD-10-CM | POA: Diagnosis not present

## 2023-07-17 DIAGNOSIS — Z79899 Other long term (current) drug therapy: Secondary | ICD-10-CM | POA: Insufficient documentation

## 2023-07-17 DIAGNOSIS — Z952 Presence of prosthetic heart valve: Secondary | ICD-10-CM

## 2023-07-17 DIAGNOSIS — R29898 Other symptoms and signs involving the musculoskeletal system: Secondary | ICD-10-CM | POA: Insufficient documentation

## 2023-07-17 DIAGNOSIS — D649 Anemia, unspecified: Secondary | ICD-10-CM | POA: Insufficient documentation

## 2023-07-17 DIAGNOSIS — I5032 Chronic diastolic (congestive) heart failure: Secondary | ICD-10-CM | POA: Insufficient documentation

## 2023-07-17 DIAGNOSIS — G459 Transient cerebral ischemic attack, unspecified: Secondary | ICD-10-CM | POA: Diagnosis not present

## 2023-07-17 DIAGNOSIS — I6782 Cerebral ischemia: Secondary | ICD-10-CM | POA: Diagnosis not present

## 2023-07-17 DIAGNOSIS — I6381 Other cerebral infarction due to occlusion or stenosis of small artery: Secondary | ICD-10-CM | POA: Diagnosis not present

## 2023-07-17 DIAGNOSIS — N1831 Chronic kidney disease, stage 3a: Secondary | ICD-10-CM | POA: Diagnosis present

## 2023-07-17 DIAGNOSIS — I672 Cerebral atherosclerosis: Secondary | ICD-10-CM | POA: Diagnosis not present

## 2023-07-17 DIAGNOSIS — Z7982 Long term (current) use of aspirin: Secondary | ICD-10-CM | POA: Insufficient documentation

## 2023-07-17 DIAGNOSIS — I2510 Atherosclerotic heart disease of native coronary artery without angina pectoris: Secondary | ICD-10-CM | POA: Diagnosis not present

## 2023-07-17 DIAGNOSIS — I342 Nonrheumatic mitral (valve) stenosis: Secondary | ICD-10-CM | POA: Diagnosis not present

## 2023-07-17 DIAGNOSIS — I639 Cerebral infarction, unspecified: Secondary | ICD-10-CM

## 2023-07-17 DIAGNOSIS — R404 Transient alteration of awareness: Secondary | ICD-10-CM | POA: Diagnosis not present

## 2023-07-17 DIAGNOSIS — Z6830 Body mass index (BMI) 30.0-30.9, adult: Secondary | ICD-10-CM | POA: Diagnosis not present

## 2023-07-17 DIAGNOSIS — I447 Left bundle-branch block, unspecified: Secondary | ICD-10-CM | POA: Diagnosis not present

## 2023-07-17 DIAGNOSIS — I130 Hypertensive heart and chronic kidney disease with heart failure and stage 1 through stage 4 chronic kidney disease, or unspecified chronic kidney disease: Secondary | ICD-10-CM | POA: Insufficient documentation

## 2023-07-17 DIAGNOSIS — G319 Degenerative disease of nervous system, unspecified: Secondary | ICD-10-CM | POA: Diagnosis not present

## 2023-07-17 DIAGNOSIS — R29810 Facial weakness: Secondary | ICD-10-CM | POA: Diagnosis not present

## 2023-07-17 DIAGNOSIS — E669 Obesity, unspecified: Secondary | ICD-10-CM | POA: Insufficient documentation

## 2023-07-17 DIAGNOSIS — R0600 Dyspnea, unspecified: Secondary | ICD-10-CM | POA: Diagnosis not present

## 2023-07-17 DIAGNOSIS — R410 Disorientation, unspecified: Secondary | ICD-10-CM | POA: Diagnosis not present

## 2023-07-17 DIAGNOSIS — Z8673 Personal history of transient ischemic attack (TIA), and cerebral infarction without residual deficits: Secondary | ICD-10-CM | POA: Diagnosis not present

## 2023-07-17 DIAGNOSIS — I700 Atherosclerosis of aorta: Secondary | ICD-10-CM | POA: Insufficient documentation

## 2023-07-17 DIAGNOSIS — C61 Malignant neoplasm of prostate: Secondary | ICD-10-CM | POA: Diagnosis not present

## 2023-07-17 DIAGNOSIS — Z8719 Personal history of other diseases of the digestive system: Secondary | ICD-10-CM

## 2023-07-17 DIAGNOSIS — I6523 Occlusion and stenosis of bilateral carotid arteries: Secondary | ICD-10-CM | POA: Diagnosis not present

## 2023-07-17 DIAGNOSIS — R29818 Other symptoms and signs involving the nervous system: Secondary | ICD-10-CM | POA: Diagnosis not present

## 2023-07-17 DIAGNOSIS — R4701 Aphasia: Secondary | ICD-10-CM | POA: Diagnosis not present

## 2023-07-17 LAB — PROTIME-INR
INR: 1 (ref 0.8–1.2)
Prothrombin Time: 13.8 s (ref 11.4–15.2)

## 2023-07-17 LAB — BRAIN NATRIURETIC PEPTIDE: B Natriuretic Peptide: 210.6 pg/mL — ABNORMAL HIGH (ref 0.0–100.0)

## 2023-07-17 LAB — COMPREHENSIVE METABOLIC PANEL WITH GFR
ALT: 14 U/L (ref 0–44)
AST: 26 U/L (ref 15–41)
Albumin: 3.9 g/dL (ref 3.5–5.0)
Alkaline Phosphatase: 52 U/L (ref 38–126)
Anion gap: 14 (ref 5–15)
BUN: 27 mg/dL — ABNORMAL HIGH (ref 8–23)
CO2: 18 mmol/L — ABNORMAL LOW (ref 22–32)
Calcium: 9.6 mg/dL (ref 8.9–10.3)
Chloride: 106 mmol/L (ref 98–111)
Creatinine, Ser: 1.33 mg/dL — ABNORMAL HIGH (ref 0.61–1.24)
GFR, Estimated: 54 mL/min — ABNORMAL LOW (ref >60)
Glucose, Bld: 115 mg/dL — ABNORMAL HIGH (ref 70–99)
Potassium: 3.9 mmol/L (ref 3.5–5.1)
Sodium: 138 mmol/L (ref 135–145)
Total Bilirubin: 0.4 mg/dL (ref 0.0–1.2)
Total Protein: 6.6 g/dL (ref 6.5–8.1)

## 2023-07-17 LAB — I-STAT CHEM 8, ED
BUN: 28 mg/dL — ABNORMAL HIGH (ref 8–23)
Calcium, Ion: 1.12 mmol/L — ABNORMAL LOW (ref 1.15–1.40)
Chloride: 106 mmol/L (ref 98–111)
Creatinine, Ser: 1.3 mg/dL — ABNORMAL HIGH (ref 0.61–1.24)
Glucose, Bld: 115 mg/dL — ABNORMAL HIGH (ref 70–99)
HCT: 27 % — ABNORMAL LOW (ref 39.0–52.0)
Hemoglobin: 9.2 g/dL — ABNORMAL LOW (ref 13.0–17.0)
Potassium: 3.9 mmol/L (ref 3.5–5.1)
Sodium: 137 mmol/L (ref 135–145)
TCO2: 21 mmol/L — ABNORMAL LOW (ref 22–32)

## 2023-07-17 LAB — CBC
HCT: 26.8 % — ABNORMAL LOW (ref 39.0–52.0)
Hemoglobin: 8.8 g/dL — ABNORMAL LOW (ref 13.0–17.0)
MCH: 29.4 pg (ref 26.0–34.0)
MCHC: 32.8 g/dL (ref 30.0–36.0)
MCV: 89.6 fL (ref 80.0–100.0)
Platelets: 199 10*3/uL (ref 150–400)
RBC: 2.99 MIL/uL — ABNORMAL LOW (ref 4.22–5.81)
RDW: 15.8 % — ABNORMAL HIGH (ref 11.5–15.5)
WBC: 3.7 10*3/uL — ABNORMAL LOW (ref 4.0–10.5)
nRBC: 0 % (ref 0.0–0.2)

## 2023-07-17 LAB — DIFFERENTIAL
Abs Immature Granulocytes: 0.02 10*3/uL (ref 0.00–0.07)
Basophils Absolute: 0 10*3/uL (ref 0.0–0.1)
Basophils Relative: 1 %
Eosinophils Absolute: 0.1 10*3/uL (ref 0.0–0.5)
Eosinophils Relative: 2 %
Immature Granulocytes: 1 %
Lymphocytes Relative: 18 %
Lymphs Abs: 0.7 10*3/uL (ref 0.7–4.0)
Monocytes Absolute: 0.7 10*3/uL (ref 0.1–1.0)
Monocytes Relative: 18 %
Neutro Abs: 2.3 10*3/uL (ref 1.7–7.7)
Neutrophils Relative %: 60 %

## 2023-07-17 LAB — CBG MONITORING, ED: Glucose-Capillary: 111 mg/dL — ABNORMAL HIGH (ref 70–99)

## 2023-07-17 LAB — D-DIMER, QUANTITATIVE: D-Dimer, Quant: 0.57 ug{FEU}/mL — ABNORMAL HIGH (ref 0.00–0.50)

## 2023-07-17 LAB — ETHANOL: Alcohol, Ethyl (B): <15 mg/dL (ref <15)

## 2023-07-17 LAB — TROPONIN I (HIGH SENSITIVITY): Troponin I (High Sensitivity): 19 ng/L — ABNORMAL HIGH (ref <18)

## 2023-07-17 LAB — APTT: aPTT: 28 s (ref 24–36)

## 2023-07-17 MED ORDER — RELUGOLIX 120 MG PO TABS: 240.0000 mg | ORAL_TABLET | Freq: Every day | ORAL | Status: DC

## 2023-07-17 MED ORDER — PAROXETINE HCL 20 MG PO TABS
20.0000 mg | ORAL_TABLET | Freq: Every morning | ORAL | Status: DC
  Administered 2023-07-18: 20 mg via ORAL
  Filled 2023-07-17 (×2): qty 1

## 2023-07-17 MED ORDER — SODIUM CHLORIDE 0.9 % IV SOLN: 250.0000 mL | INTRAVENOUS | Status: DC | PRN

## 2023-07-17 MED ORDER — GADOBUTROL 1 MMOL/ML IV SOLN
10.0000 mL | Freq: Once | INTRAVENOUS | Status: AC | PRN
  Administered 2023-07-17: 10 mL via INTRAVENOUS

## 2023-07-17 MED ORDER — SODIUM CHLORIDE 0.9% FLUSH: 3.0000 mL | INTRAVENOUS | Status: DC | PRN

## 2023-07-17 MED ORDER — SODIUM CHLORIDE 0.9% FLUSH
3.0000 mL | Freq: Two times a day (BID) | INTRAVENOUS | Status: DC
  Administered 2023-07-17 – 2023-07-18 (×2): 3 mL via INTRAVENOUS

## 2023-07-17 MED ORDER — ENOXAPARIN SODIUM 40 MG/0.4ML IJ SOSY
40.0000 mg | PREFILLED_SYRINGE | INTRAMUSCULAR | Status: DC
  Administered 2023-07-17: 40 mg via SUBCUTANEOUS

## 2023-07-17 MED ORDER — APALUTAMIDE 60 MG PO TABS: 240.0000 mg | ORAL_TABLET | Freq: Every day | ORAL | Status: DC

## 2023-07-17 MED ORDER — MELATONIN 5 MG PO TABS
10.0000 mg | ORAL_TABLET | Freq: Every day | ORAL | Status: DC
  Administered 2023-07-17: 10 mg via ORAL
  Filled 2023-07-17: qty 2

## 2023-07-17 MED ORDER — ASPIRIN 81 MG PO TBEC
81.0000 mg | DELAYED_RELEASE_TABLET | Freq: Every day | ORAL | Status: DC
  Administered 2023-07-18: 81 mg via ORAL
  Filled 2023-07-17: qty 1

## 2023-07-17 MED ORDER — METOPROLOL SUCCINATE ER 25 MG PO TB24: 50.0000 mg | ORAL_TABLET | Freq: Every day | ORAL | Status: DC

## 2023-07-17 MED ORDER — IOHEXOL 350 MG/ML SOLN
75.0000 mL | Freq: Once | INTRAVENOUS | Status: AC | PRN
  Administered 2023-07-17: 75 mL via INTRAVENOUS

## 2023-07-17 NOTE — Consult Note (Signed)
 NEUROLOGY CONSULT NOTE   Date of service: July 17, 2023 Patient Name: Logan Wolfe MRN:  [id number] DOB:  [birth year] Chief Complaint: "CODE STROKE" Requesting Provider: Mozell Arias, MD  History of Present Illness  Logan Wolfe is a 78 y.o. male with hx of TAVR 03/2023, on ASA, Prostate Cancer who was BIB EMS after being found in his car aphasic, on the side of the road by a bystander. Patient had chinese food in his car and the receipt showed the time of pickup at 1142. EMS called the restaurant and they stated that he showed no signs of altered mental status when there.  On exam at bridge, patient is alert and oriented, right nasolabial fold flattening, mild expressive aphasia, moves all extremities with no drift, no sensory deficit, no ataxia.  CTH negative for acute abnormality. CTA negative for large vessel occlusion.   LKW: 1142 Modified rankin score: 0-Completely asymptomatic and back to baseline post- stroke IV Thrombolysis: No, low NIH EVT: No, No LVO   NIHSS components Score: Comment  1a Level of Conscious 0[]  1[]  2[]  3[]      1b LOC Questions 0[]  1[]  2[]       1c LOC Commands 0[]  1[]  2[]       2 Best Gaze 0[]  1[]  2[]       3 Visual 0[]  1[]  2[]  3[]      4 Facial Palsy 0[]  1[x]  2[]  3[]      5a Motor Arm - left 0[]  1[]  2[]  3[]  4[]  UN[]    5b Motor Arm - Right 0[]  1[]  2[]  3[]  4[]  UN[]    6a Motor Leg - Left 0[]  1[]  2[]  3[]  4[]  UN[]    6b Motor Leg - Right 0[]  1[]  2[]  3[]  4[]  UN[]    7 Limb Ataxia 0[]  1[]  2[]  UN[]      8 Sensory 0[]  1[]  2[]  UN[]      9 Best Language 0[]  1[x]  2[]  3[]      10 Dysarthria 0[]  1[]  2[]  UN[]      11 Extinct. 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 Arlester Ladd & Dr. Honey Lusty   Severe aortic stenosis    Spinal stenosis    Unspecified pruritic disorder     Past Surgical History:  Procedure Laterality Date   BIOPSY  01/10/2023   Procedure: BIOPSY;  Surgeon: Ozell Blunt, MD;  Location: [facility] ENDOSCOPY;  Service: Gastroenterology;;   COLONOSCOPY WITH PROPOFOL  N/A 01/10/2023   Procedure: COLONOSCOPY WITH PROPOFOL ;  Surgeon: Ozell Blunt, MD;  Location: [facility] ENDOSCOPY;  Service: Gastroenterology;  Laterality: N/A;   HOT HEMOSTASIS N/A 01/10/2023   Procedure: HOT HEMOSTASIS ARGON PLASMA COAGULATION;  Surgeon: Ozell Blunt, MD;  Location: [facility] ENDOSCOPY;  Service: Gastroenterology;  Laterality: N/A;   INTRAOPERATIVE TRANSTHORACIC ECHOCARDIOGRAM N/A 03/20/2023   Procedure: INTRAOPERATIVE TRANSTHORACIC ECHOCARDIOGRAM;  Surgeon: Arnoldo Lapping, MD;  Location: [facility] INVASIVE CV LAB;  Service: Cardiovascular;  Laterality: N/A;   LEFT HEART CATH AND CORONARY ANGIOGRAPHY N/A 01/09/2023   Procedure: LEFT HEART CATH AND CORONARY ANGIOGRAPHY;  Surgeon: Knox Perl, MD;  Location: [facility] INVASIVE CV LAB;  Service: Cardiovascular;  Laterality: N/A;   PROSTATE SURGERY      Family History: Family History  Problem Relation Age of Onset   Hyperlipidemia Mother    Diabetes Mother    Skin cancer Father        part of right ear removed   Prostate cancer Brother    Breast cancer Neg Hx    Colon cancer Neg Hx    Pancreatic cancer Neg Hx     Social History  reports that he has been smoking cigars. He has never used smokeless tobacco. He reports current alcohol use of about 1.0 standard drink of alcohol per week. He reports that he does not use drugs.  Allergies  Allergen Reactions   Doxycycline     burning on tongue   Neosporin  [Bacitracin-Polymyxin B] Other (See Comments)    Redness   Penicillins Rash    Medications  No current facility-administered medications for this encounter.  Current Outpatient Medications:    apalutamide  (ERLEADA ) 60 MG tablet, Take 240 mg by mouth daily. Take 4 tabs daily, Disp: , Rfl:    aspirin  EC 81 MG tablet, Take 81 mg by mouth daily. Swallow whole., Disp: , Rfl:    azithromycin  (ZITHROMAX ) 500 MG tablet, Take 1 tablet (500 mg total) by mouth as directed. Take one tablet 1 hour before any dental work including cleanings., Disp: 6 tablet, Rfl: 12   Calcium  Carb-Cholecalciferol (CALCIUM  600 + D PO), Take 2 tablets by mouth daily., Disp: , Rfl:    calcium  carbonate (TUMS - DOSED IN MG ELEMENTAL CALCIUM ) 500 MG chewable tablet, Chew 1 tablet by mouth daily as needed for indigestion or heartburn., Disp: , Rfl:    cyanocobalamin  (VITAMIN B12) 1000 MCG tablet, Take 1,000 mcg by mouth daily., Disp: , Rfl:    dextromethorphan-guaiFENesin (MUCINEX DM) 30-600 MG 12hr tablet, Take 1 tablet by mouth every 4 (four) hours as needed for cough., Disp: , Rfl:    fluticasone (FLONASE) 50 MCG/ACT nasal spray, Place 2 sprays into both nostrils daily as needed for allergies., Disp: , Rfl:    iron  polysaccharides (NIFEREX) 150 MG capsule, Take 150 mg by mouth 2 (two) times daily., Disp: , Rfl:    loperamide (IMODIUM A-D) 2 MG tablet, Take 2 mg by mouth 4 (four) times daily as needed for diarrhea or loose stools., Disp: , Rfl:    Melatonin 10 MG CHEW, Chew 20 mg by mouth at bedtime. May take another 10 mg dose during the middle of the night as needed for sleep, Disp: , Rfl:    metoprolol  succinate (TOPROL -XL) 50 MG 24 hr tablet, Take 1 tablet (50 mg total) by mouth daily. Take with or immediately following a meal., Disp: 90 tablet, Rfl: 3   PARoxetine  (PAXIL ) 20 MG tablet, Take 20 mg by mouth every morning., Disp: , Rfl:    relugolix  (ORGOVYX ) 120 MG tablet, Take 240 mg by mouth daily., Disp: , Rfl:   Vitals    Vitals:   07/17/23 1300  Weight: (P) 108.2 kg    Body mass index is 31.47 kg/m (pended).  Physical Exam   Constitutional: Appears well-developed and well-nourished.  Cardiovascular: Normal rate and regular rhythm.  Respiratory: Effort normal, non-labored breathing.   Neurologic Examination  Neuro: Mental Status: Patient is awake, alert, oriented to person, place, month, year, and situation. Patient is able to give a clear and coherent history. Mild expressive aphasia, no dysarthria Cranial Nerves: II: Visual Fields are full. Pupils are equal, round, and reactive to light.   III,IV, VI: EOMI without ptosis or diploplia.  V: Facial sensation is symmetric to light touch VII: Right nasolabial fold flattening VIII: hearing is intact to voice X: Uvula elevates symmetrically XI: Shoulder shrug is symmetric. XII: tongue is midline  Motor: Tone is normal. Bulk is normal. 5/5 strength was present in all four extremities.  Sensory: Sensation is symmetric to light touch and temperature in the arms and legs. Cerebellar: FNF and HKS are intact bilaterally   Labs/Imaging/Neurodiagnostic studies   CBC:  Recent Labs  Lab Jul 19, 2023 1341  HGB 9.2*  HCT 27.0*   Basic Metabolic Panel:  Lab Results  Component Value Date   NA 137 07/19/2023   K 3.9 07/19/2023   CO2 24 03/30/2023   GLUCOSE 115 (H) July 19, 2023   BUN 28 (H) 2023/07/19   CREATININE 1.30 (H) Jul 19, 2023   CALCIUM  9.8 03/30/2023   GFRNONAA 52 (L) 03/21/2023   GFRAA >60 02/19/2017   Lipid Panel:  Lab Results  Component Value Date   LDLCALC 61 01/29/2023   HgbA1c:  Lab Results  Component Value Date   HGBA1C 4.9 01/08/2023   Urine Drug Screen: No results found for: "LABOPIA", "COCAINSCRNUR", "LABBENZ", "AMPHETMU", "THCU", "LABBARB"  Alcohol Level No results found for: "ETH" INR  Lab Results  Component Value Date   INR 1.1 03/16/2023   APTT No results found for: "APTT" AED levels: No results found for:  "PHENYTOIN", "ZONISAMIDE", "LAMOTRIGINE", "LEVETIRACETA"  CT Head without contrast(Personally reviewed): No CT evidence of acute intracranial abnormality. ASPECTS is 10  CT angio Head and Neck with contrast(Personally reviewed): No large vessel occlusion. No high-grade stenosis, aneurysm, or dissection of the arteries in the head and neck.  MRI Brain(Personally reviewed): ordered   ASSESSMENT   Logan Wolfe is a 78 y.o. male with hx of TAVR 03/2023, on ASA, Prostate Cancer who was BIB EMS after being found in his car aphasic, on the side of the road by a bystander.   On exam at bridge, patient is alert and oriented, right nasolabial fold flattening, mild expressive aphasia, moves all extremities with no drift, no sensory deficit, no ataxia.   CTH negative for acute abnormality. CTA negative for LVO.   RECOMMENDATIONS   - Q30 minute neuro checks until outside of TNK window  - Admit for stroke workup - Permissive HTN x48 hrs from sx onset or until stroke ruled out by MRI goal BP <220/110. PRN labetalol or hydralazine if BP above these parameters. Avoid oral antihypertensives. - MRI brain wo contrast - TTE - Check A1c and LDL + add statin per guidelines - ASA 81mg  daily + plavix 75mg  daily x21 days f/b ASA 81mg  daily monotherapy after that - q4 hr neuro checks - STAT head CT for any change in neuro exam - Tele - PT/OT/SLP - Stroke education - Amb referral to neurology upon discharge   ______________________________________________________________________    Signed, Audrene Lease, NP [facility]

## 2023-07-17 NOTE — ED Notes (Addendum)
 Pt in NAD at this time. Pt walked to the bathroom

## 2023-07-17 NOTE — ED Triage Notes (Signed)
 Patient bib EMS after a bystander found him in his vehicle. On arrival to scene EMS states patient was expressive aphasic, confused, with  a right facial droop, dysarthric and had some ataxia. On arrival to ED patient is alert and oriented following all commands.  Patient reported he was nauseas on the way to play golf this morning and also he was intermittently dizzy at the golf course.

## 2023-07-17 NOTE — ED Notes (Signed)
 Patient asked to give urine sample, patient stated that the only time he is able to pee is in the morning. He has stage 4 prostate cancer.

## 2023-07-17 NOTE — ED Notes (Signed)
 Patients son updated at request of patient.

## 2023-07-17 NOTE — ED Provider Notes (Signed)
 [place] EMERGENCY DEPARTMENT AT [facility] Provider Note   CSN: [id number] Arrival date & time: 07/17/23  1335  An emergency department physician performed an initial assessment on this suspected stroke patient at 1138.  History  Chief Complaint  Patient presents with   Code Stroke    Logan Wolfe is a 78 y.o. male.  HPI Patient presented as a code stroke.  Met by myself and CT scan from by neurology at [place].  Last normal at 1142.  Had reported confusion and aphasia.  Improving now.  Had golf this morning and was ordering food.  Had gone out to get it and was more confused.      Home Medications Prior to Admission medications   Medication Sig Start Date End Date Taking? Authorizing Provider  apalutamide  (ERLEADA ) 60 MG tablet Take 240 mg by mouth daily. Take 4 tabs daily    [provider]  aspirin  EC 81 MG tablet Take 81 mg by mouth daily. Swallow whole.    [provider]  azithromycin  (ZITHROMAX ) 500 MG tablet Take 1 tablet (500 mg total) by mouth as directed. Take one tablet 1 hour before any dental work including cleanings. 03/30/23   Ardia Kraft, PA-C  Calcium  Carb-Cholecalciferol (CALCIUM  600 + D PO) Take 2 tablets by mouth daily.    [provider]  calcium  carbonate (TUMS - DOSED IN MG ELEMENTAL CALCIUM ) 500 MG chewable tablet Chew 1 tablet by mouth daily as needed for indigestion or heartburn.    [provider]  cyanocobalamin  (VITAMIN B12) 1000 MCG tablet Take 1,000 mcg by mouth daily.    [provider]  dextromethorphan-guaiFENesin (MUCINEX DM) 30-600 MG 12hr tablet Take 1 tablet by mouth every 4 (four) hours as needed for cough.    [provider]  fluticasone (FLONASE) 50 MCG/ACT nasal spray Place 2 sprays into both nostrils daily as needed for allergies.    [provider]  iron  polysaccharides (NIFEREX) 150 MG capsule Take 150 mg by mouth 2 (two) times daily. 01/03/23   [provider]  loperamide (IMODIUM A-D) 2 MG tablet Take 2 mg by mouth 4 (four) times daily as needed for diarrhea or loose stools.    [provider]  Melatonin 10 MG CHEW Chew 20 mg by mouth at bedtime. May take another 10 mg dose during the middle of the night as needed for sleep    [provider]  metoprolol  succinate (TOPROL -XL) 50 MG 24 hr tablet Take 1 tablet (50 mg total) by mouth daily. Take with or immediately following a meal. 04/13/23   Ardia Kraft, PA-C  PARoxetine  (PAXIL ) 20 MG tablet Take 20 mg by mouth every morning. 11/23/22   [provider]  relugolix  (ORGOVYX ) 120 MG tablet Take 240 mg by mouth daily.    [provider]      Allergies    Doxycycline, Neosporin [bacitracin-polymyxin b], and Penicillins    Review of Systems   Review of Systems  Physical Exam Updated Vital Signs BP (!) 146/79   Pulse 81   Resp 18   Wt 108.2 kg   SpO2 96%   BMI 31.47 kg/m  Physical Exam Vitals and nursing note reviewed.  HENT:     Head: Normocephalic.  Cardiovascular:     Rate and Rhythm: Normal rate.  Chest:     Chest wall: No tenderness.  Abdominal:     Tenderness: There is no abdominal tenderness.  Skin:  General: Skin is warm.  Neurological:     Mental Status: He is alert and oriented to person, place, and time. Mental status is at baseline.     ED Results / Procedures / Treatments   Labs (all labs ordered are listed, but only abnormal results are displayed) Labs Reviewed  CBC - Abnormal; Notable for the following components:      Result Value   WBC 3.7 (*)    RBC 2.99 (*)    Hemoglobin 8.8 (*)    HCT 26.8 (*)    RDW 15.8 (*)    All other components within normal limits  COMPREHENSIVE METABOLIC PANEL WITH GFR - Abnormal; Notable for the following components:   CO2 18 (*)    Glucose, Bld 115 (*)    BUN 27 (*)    Creatinine, Ser 1.33 (*)    GFR, Estimated 54 (*)    All other components within normal limits  CBG  MONITORING, ED - Abnormal; Notable for the following components:   Glucose-Capillary 111 (*)    All other components within normal limits  I-STAT CHEM 8, ED - Abnormal; Notable for the following components:   BUN 28 (*)    Creatinine, Ser 1.30 (*)    Glucose, Bld 115 (*)    Calcium , Ion 1.12 (*)    TCO2 21 (*)    Hemoglobin 9.2 (*)    HCT 27.0 (*)    All other components within normal limits  ETHANOL  PROTIME-INR  APTT  DIFFERENTIAL  RAPID URINE DRUG SCREEN, HOSP PERFORMED    EKG None  Radiology CT HEAD CODE STROKE WO CONTRAST Result Date: 07/17/2023 CLINICAL DATA:  Code stroke. Neuro deficit, concern for stroke, mild aphasia. EXAM: CT HEAD WITHOUT CONTRAST TECHNIQUE: Contiguous axial images were obtained from the base of the skull through the vertex without intravenous contrast. RADIATION DOSE REDUCTION: This exam was performed according to the departmental dose-optimization program which includes automated exposure control, adjustment of the mA and/or kV according to patient size and/or use of iterative reconstruction technique. COMPARISON:  MRI head 12/12/2012. FINDINGS: Brain: No acute intracranial hemorrhage. No CT evidence of acute infarct. Nonspecific hypoattenuation in the periventricular and subcortical white matter favored to reflect chronic microvascular ischemic changes. 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 No edema, mass effect, or midline shift. The basilar cisterns are patent. Ventricles: The ventricles are normal. Vascular: Atherosclerotic calcifications of the carotid siphons. No hyperdense vessel. Skull: No acute or aggressive finding. Orbits: Orbits are symmetric. Sinuses: The visualized paranasal sinuses are clear. Other: Mastoid air cells are clear. ASPECTS [facility] Stroke Program Early CT Score) - Ganglionic level infarction (caudate, lentiform nuclei, internal capsule, insula, M1-M3 cortex): 7 - Supraganglionic infarction (M4-M6 cortex): 3  Total score (0-10 with 10 being normal): 10 IMPRESSION: 1. No CT evidence of acute intracranial abnormality. 2. ASPECTS is 10 These results were communicated to Dr. Doretta Gant At 1:50 pm on 07/17/2023 by text page via the [facility] messaging system. Electronically Signed   By: Denny Flack M.D.   On: 07/17/2023 13:50    Procedures Procedures    Medications Ordered in ED Medications  iohexol  (OMNIPAQUE ) 350 MG/ML injection 75 mL (75 mLs Intravenous Contrast Given 07/17/23 1347)    ED Course/ Medical Decision Making/ A&P                                 Medical Decision Making Amount and/or Complexity  of Data Reviewed Labs: ordered. Radiology: ordered.   Patient presented as a code stroke.  Had aphasia.  Much improved symptoms at this time.  On my exam no clear deficits. Discussed with neurology, Dr. Doretta Gant.  Not a TNK candidate due to much improved symptoms.  However until 4 and half hours after symptoms started patient is still a TNK candidate.  Will continue to monitor.  Head CT reassuring.  CTA done but not code stroke.  Also has history of prostate cancer so we will get this MRI with and without contrast.  Also discussed with the patient's son.  Will require admission to hospital.  CRITICAL CARE Performed by: Mozell Arias Total critical care time: 30 minutes Critical care time was exclusive of separately billable procedures and treating other patients. Critical care was necessary to treat or prevent imminent or life-threatening deterioration. Critical care was time spent personally by me on the following activities: development of treatment plan with patient and/or surrogate as well as nursing, discussions with consultants, evaluation of patient's response to treatment, examination of patient, obtaining history from patient or surrogate, ordering and performing treatments and interventions, ordering and review of laboratory studies, ordering and review of radiographic studies, pulse  oximetry and re-evaluation of patient's condition.  Patient's 4-1/2-hour window ends at 415.       Final Clinical Impression(s) / ED Diagnoses Final diagnoses:  TIA (transient ischemic attack)    Rx / DC Orders ED Discharge Orders     None         Mozell Arias, MD 07/17/23 1458

## 2023-07-17 NOTE — H&P (Signed)
 History and Physical    Logan Wolfe ZOX:[id number] DOB: [birth year] DOA: 07/17/2023  PCP: Jimmey Mould, MD  Patient coming from: home   Chief Complaint: aphasia  HPI: Logan Wolfe is a 78 y.o. male with medical history significant for dchf, ckd 3a, gi bleed, tavr earlier this year, prostate cancer stage 4, who presents with the above.  In usual state of health though past several weeks has been getting a bit more short-winded than previously. Played golf this morning, driving to lunch developed acute confusion, pulled over, couldn't use left arm, also trouble speaking. Bystander alerted police who alerted EMS. Symptoms resolved, lasted for about an hour. Never happened before. No recent med changes, no toxic habits. No fever, no n/v/d, no new pains. No chest pain or palpitations   Review of Systems: As per HPI otherwise 10 point review of systems negative.    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 Arlester Ladd & Dr. Honey Lusty   Severe aortic stenosis    Spinal stenosis    Unspecified pruritic disorder     Past Surgical History:  Procedure Laterality Date   BIOPSY  01/10/2023   Procedure: BIOPSY;  Surgeon: Ozell Blunt, MD;  Location: [facility] ENDOSCOPY;  Service: Gastroenterology;;   COLONOSCOPY WITH PROPOFOL  N/A 01/10/2023   Procedure: COLONOSCOPY WITH PROPOFOL ;  Surgeon: Ozell Blunt, MD;  Location: [facility] ENDOSCOPY;  Service: Gastroenterology;  Laterality: N/A;   HOT HEMOSTASIS N/A 01/10/2023   Procedure: HOT HEMOSTASIS ARGON PLASMA COAGULATION;  Surgeon: Ozell Blunt, MD;  Location: [facility] ENDOSCOPY;  Service:  Gastroenterology;  Laterality: N/A;   INTRAOPERATIVE TRANSTHORACIC ECHOCARDIOGRAM N/A 03/20/2023   Procedure: INTRAOPERATIVE TRANSTHORACIC ECHOCARDIOGRAM;  Surgeon: Arnoldo Lapping, MD;  Location: [facility] INVASIVE CV LAB;  Service: Cardiovascular;  Laterality: N/A;   LEFT HEART CATH AND CORONARY ANGIOGRAPHY N/A 01/09/2023   Procedure: LEFT HEART CATH AND CORONARY ANGIOGRAPHY;  Surgeon: Knox Perl, MD;  Location: [facility] INVASIVE CV LAB;  Service: Cardiovascular;  Laterality: N/A;   PROSTATE SURGERY       reports that he has been smoking cigars. He has never used smokeless tobacco. He reports current alcohol use of about 1.0 standard drink of alcohol per week. He reports that he does not use drugs.  Allergies  Allergen Reactions   Doxycycline     burning on tongue   Neosporin [Bacitracin-Polymyxin B] Other (See Comments)    Redness   Penicillins Rash    Family History  Problem Relation Age of Onset   Hyperlipidemia Mother    Diabetes Mother    Skin cancer Father        part of right ear removed   Prostate cancer Brother    Breast cancer Neg Hx    Colon cancer Neg Hx    Pancreatic cancer Neg Hx     Prior to Admission medications   Medication Sig Start Date End Date Taking? Authorizing Provider  apalutamide  (ERLEADA ) 60 MG tablet Take 240 mg by mouth daily. Take 4 tabs daily  [provider]  aspirin  EC 81 MG tablet Take 81 mg by mouth daily. Swallow whole.    [provider]  azithromycin  (ZITHROMAX ) 500 MG tablet Take 1 tablet (500 mg total) by mouth as directed. Take one tablet 1 hour before any dental work including cleanings. 03/30/23   Ardia Kraft, PA-C  Calcium  Carb-Cholecalciferol (CALCIUM  600 + D PO) Take 2 tablets by mouth daily.    [provider]  calcium  carbonate (TUMS - DOSED IN MG ELEMENTAL CALCIUM ) 500 MG chewable tablet Chew 1 tablet by mouth daily as needed for indigestion or heartburn.    [provider]  cyanocobalamin   (VITAMIN B12) 1000 MCG tablet Take 1,000 mcg by mouth daily.    [provider]  dextromethorphan-guaiFENesin (MUCINEX DM) 30-600 MG 12hr tablet Take 1 tablet by mouth every 4 (four) hours as needed for cough.    [provider]  fluticasone (FLONASE) 50 MCG/ACT nasal spray Place 2 sprays into both nostrils daily as needed for allergies.    [provider]  iron  polysaccharides (NIFEREX) 150 MG capsule Take 150 mg by mouth 2 (two) times daily. 01/03/23   [provider]  loperamide (IMODIUM A-D) 2 MG tablet Take 2 mg by mouth 4 (four) times daily as needed for diarrhea or loose stools.    [provider]  Melatonin 10 MG CHEW Chew 20 mg by mouth at bedtime. May take another 10 mg dose during the middle of the night as needed for sleep    [provider]  metoprolol  succinate (TOPROL -XL) 50 MG 24 hr tablet Take 1 tablet (50 mg total) by mouth daily. Take with or immediately following a meal. 04/13/23   Ardia Kraft, PA-C  PARoxetine  (PAXIL ) 20 MG tablet Take 20 mg by mouth every morning. 11/23/22   [provider]  relugolix  (ORGOVYX ) 120 MG tablet Take 240 mg by mouth daily.    [provider]    Physical Exam: Vitals:   07/17/23 1345 07/17/23 1400 07/17/23 1402 07/17/23 1445  BP: (!) 160/81 (!) 146/79  (!) 154/85  Pulse:  81  70  Resp:  18  15  SpO2:  100% 96% 100%  Weight:        Constitutional: No acute distress Head: Atraumatic Eyes: Conjunctiva clear ENM: Moist mucous membranes. Normal dentition.  Neck: Supple Respiratory: Clear to auscultation bilaterally, no wheezing/rales/rhonchi. N  Cardiovascular: Regular rate and rhythm. No murmurs/rubs/gallops. Abdomen: Non-tender, non-distended.   Musculoskeletal: No joint deformity upper and lower extremities.   Skin: No rashes, lesions, or ulcers.  Extremities: No peripheral edema. Palpable peripheral pulses. Neurologic: Alert, moving all 4 extremities. 5/5  upper/lower strength, cn 2-12 grossly intact Psychiatric: Normal insight and judgement.   Labs on Admission: I have personally reviewed following labs and imaging studies  CBC: Recent Labs  Lab 07/17/23 1339 07/17/23 1341  WBC 3.7*  --   NEUTROABS 2.3  --   HGB 8.8* 9.2*  HCT 26.8* 27.0*  MCV 89.6  --   PLT 199  --    Basic Metabolic Panel: Recent Labs  Lab 07/17/23 1339 07/17/23 1341  NA 138 137  K 3.9 3.9  CL 106 106  CO2 18*  --   GLUCOSE 115* 115*  BUN 27* 28*  CREATININE 1.33* 1.30*  CALCIUM  9.6  --    GFR: Estimated Creatinine Clearance: 59.4 mL/min (A) (by C-G formula based on SCr of 1.3 mg/dL (H)). Liver Function Tests: Recent Labs  Lab 07/17/23  1339  AST 26  ALT 14  ALKPHOS 52  BILITOT 0.4  PROT 6.6  ALBUMIN 3.9   No results for input(s): "LIPASE", "AMYLASE" in the last 168 hours. No results for input(s): "AMMONIA" in the last 168 hours. Coagulation Profile: Recent Labs  Lab 07/17/23 1339  INR 1.0   Cardiac Enzymes: No results for input(s): "CKTOTAL", "CKMB", "CKMBINDEX", "TROPONINI" in the last 168 hours. BNP (last 3 results) No results for input(s): "PROBNP" in the last 8760 hours. HbA1C: No results for input(s): "HGBA1C" in the last 72 hours. CBG: Recent Labs  Lab 07/17/23 1336  GLUCAP 111*   Lipid Profile: No results for input(s): "CHOL", "HDL", "LDLCALC", "TRIG", "CHOLHDL", "LDLDIRECT" in the last 72 hours. Thyroid  Function Tests: No results for input(s): "TSH", "T4TOTAL", "FREET4", "T3FREE", "THYROIDAB" in the last 72 hours. Anemia Panel: No results for input(s): "VITAMINB12", "FOLATE", "FERRITIN", "TIBC", "IRON ", "RETICCTPCT" in the last 72 hours. Urine analysis:    Component Value Date/Time   COLORURINE YELLOW 10/18/2008 1519   APPEARANCEUR CLOUDY (A) 10/18/2008 1519   LABSPEC 1.024 10/18/2008 1519   PHURINE 7.5 10/18/2008 1519   GLUCOSEU NEGATIVE 10/18/2008 1519   HGBUR NEGATIVE 10/18/2008 1519   BILIRUBINUR NEGATIVE  10/18/2008 1519   KETONESUR NEGATIVE 10/18/2008 1519   PROTEINUR NEGATIVE 10/18/2008 1519   UROBILINOGEN 0.2 10/18/2008 1519   NITRITE NEGATIVE 10/18/2008 1519   LEUKOCYTESUR  10/18/2008 1519    NEGATIVE MICROSCOPIC NOT DONE ON URINES WITH NEGATIVE PROTEIN, BLOOD, LEUKOCYTES, NITRITE, OR GLUCOSE <1000 mg/dL.    Radiological Exams on Admission: CT ANGIO HEAD NECK W WO CM Result Date: 07/17/2023 CLINICAL DATA:  Neuro deficit, concern for stroke.  Aphasia. EXAM: CT ANGIOGRAPHY HEAD AND NECK WITH AND WITHOUT CONTRAST TECHNIQUE: Multidetector CT imaging of the head and neck was performed using the standard protocol during bolus administration of intravenous contrast. Multiplanar CT image reconstructions and MIPs were obtained to evaluate the vascular anatomy. Carotid stenosis measurements (when applicable) are obtained utilizing NASCET criteria, using the distal internal carotid diameter as the denominator. RADIATION DOSE REDUCTION: This exam was performed according to the departmental dose-optimization program which includes automated exposure control, adjustment of the mA and/or kV according to patient size and/or use of iterative reconstruction technique. CONTRAST:  75mL OMNIPAQUE  IOHEXOL  350 MG/ML SOLN COMPARISON:  Earlier same day CT head. FINDINGS: CTA NECK FINDINGS Aortic arch: Standard configuration of the aortic arch. Imaged portion shows no evidence of aneurysm or dissection. Mild atherosclerosis. No significant stenosis of the major arch vessel origins. Pulmonary arteries: As permitted by contrast timing, there are no filling defects in the visualized pulmonary arteries. Subclavian arteries: The subclavian arteries are patent bilaterally. Right carotid system: Patent. Mild atherosclerosis at the carotid bifurcation without hemodynamically significant stenosis. No evidence of dissection. Left carotid system: Patent. Mild atherosclerosis at the carotid bifurcation without hemodynamically significant  stenosis. No evidence of dissection. Vertebral arteries: Codominant. No evidence of dissection, stenosis (50% or greater), or occlusion. Atherosclerosis along the left V3 segment without stenosis. Skeleton: No acute findings. Degenerative changes in the cervical spine. Other neck: The visualized airway is patent. No cervical lymphadenopathy. Upper chest: Visualized lung apices are clear. Review of the MIP images confirms the above findings CTA HEAD FINDINGS ANTERIOR CIRCULATION: The intracranial ICAs are patent bilaterally. Mild atherosclerosis of the carotid siphons. No significant stenosis, proximal occlusion, aneurysm, or vascular malformation. MCAs: The middle cerebral arteries are patent bilaterally. ACAs: The anterior cerebral arteries are patent bilaterally. POSTERIOR CIRCULATION: No significant stenosis, proximal occlusion, aneurysm, or  vascular malformation. PCAs: The posterior cerebral arteries are patent bilaterally. Pcomm: Not well visualized. SCAs: The superior cerebellar arteries are patent bilaterally. Basilar artery: Patent AICAs: Small caliber visualized on the right. PICAs: Visualized on the left. Vertebral arteries: The intracranial vertebral arteries are patent. Venous sinuses: As permitted by contrast timing, patent. Anatomic variants: None Review of the MIP images confirms the above findings Review of the MIP images confirms the above findings IMPRESSION: No large vessel occlusion. No high-grade stenosis, aneurysm, or dissection of the arteries in the head and neck. Atherosclerosis at the carotid bifurcations without hemodynamically significant stenosis. Aortic Atherosclerosis (ICD10-I70.0). Electronically Signed   By: Denny Flack M.D.   On: 07/17/2023 15:12   CT HEAD CODE STROKE WO CONTRAST Result Date: 07/17/2023 CLINICAL DATA:  Code stroke. Neuro deficit, concern for stroke, mild aphasia. EXAM: CT HEAD WITHOUT CONTRAST TECHNIQUE: Contiguous axial images were obtained from the base of the  skull through the vertex without intravenous contrast. RADIATION DOSE REDUCTION: This exam was performed according to the departmental dose-optimization program which includes automated exposure control, adjustment of the mA and/or kV according to patient size and/or use of iterative reconstruction technique. COMPARISON:  MRI head 12/12/2012. FINDINGS: Brain: No acute intracranial hemorrhage. No CT evidence of acute infarct. Nonspecific hypoattenuation in the periventricular and subcortical white matter favored to reflect chronic microvascular ischemic changes. 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 No edema, mass effect, or midline shift. The basilar cisterns are patent. Ventricles: The ventricles are normal. Vascular: Atherosclerotic calcifications of the carotid siphons. No hyperdense vessel. Skull: No acute or aggressive finding. Orbits: Orbits are symmetric. Sinuses: The visualized paranasal sinuses are clear. Other: Mastoid air cells are clear. ASPECTS [facility] Stroke Program Early CT Score) - Ganglionic level infarction (caudate, lentiform nuclei, internal capsule, insula, M1-M3 cortex): 7 - Supraganglionic infarction (M4-M6 cortex): 3 Total score (0-10 with 10 being normal): 10 IMPRESSION: 1. No CT evidence of acute intracranial abnormality. 2. ASPECTS is 10 These results were communicated to Dr. Doretta Gant At 1:50 pm on 07/17/2023 by text page via the [facility] messaging system. Electronically Signed   By: Denny Flack M.D.   On: 07/17/2023 13:50    EKG: Independently reviewed. Lbb (old)  Assessment/Plan Principal Problem:   TIA (transient ischemic attack) Active Problems:   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HCC)   CKD stage 3a, GFR 45-59 ml/min (HCC)   S/P TAVR (transcatheter aortic valve replacement)   History of GI bleed   Obesity (BMI 30-39.9)   CAD (coronary artery disease)   # Transient confusion, dysarthria, left arm weakness Concerning for TIA/CVA. Back to baseline.  CT head and CT angio head/neck nothing acute. Passed bedside swallow screen - f/u mri - f/u tte - tele - A1c, lipids - continue asa, defer antiplatelets to neurology  # Hx TAVR # HFpEF With several weeks slightly worsening DOE.  - TTE, trop, dimer, cxr  # Prostate cancer, stage 4 Followed by urology oncology - cont home erleada  and orgovyx   # HFpEF - tte as above - cont home metop  # GAD - home paxil   # CKD 3a At baseline  # Obesity noted    DVT prophylaxis: lovenox Code Status: full  Family Communication: daughter updated at bedside 6/3  Consults called: neurology   Level of care: Telemetry Medical Status is: Observation     Raymonde Calico MD [facility] Pager [phone]  If 7PM-7AM, please contact night-coverage [email or web address] Password [facility]  07/17/2023, 4:52 PM

## 2023-07-17 NOTE — Code Documentation (Signed)
 Stroke Response Nurse Documentation Code Documentation  DAVID RODRIQUEZ is a 78 y.o. male arriving to [facility]  via [facility] on 07/17/2023 with past medical hx of s/p TAVR 03/2023, SVT, GI Bleed, prostate cancer, severe aortic stenosis, spinal stenosis. On aspirin  81 mg daily. Code stroke was activated by EMS.   Patient was LKW at 1142 and now complaining of right facial droop and aphasia. Per EMS, the patient was found in his car on the side of the road by a bystander. [place] food in his front seat indicated a time of 1142, with EMS calling to confirm that was not altered while there. EMS stated patient had a right facial droop and significant aphasia. Patient stated he played 9 holes of golf this morning and would get dizzy when he bent down to pick up the ball as well as had a bout of nausea on the way to play gold.   Stroke team at the bedside on patient arrival. Labs drawn and patient cleared for CT by Dr. Val Garin. Patient to CT with team. NIHSS 2, see documentation for details and code stroke times. Patient with right facial droop and Expressive aphasia  on exam. The following imaging was completed:  CT Head. Patient is not a candidate for IV Thrombolytic due to low NIH per MD. Patient is not a candidate for IR due to no LVO noted on imaging per MD.   Care Plan: VS/NIHSS q70min until out of TNK window at 1612, then q2hr x12hrs, then q4hr.   Bedside handoff with ED RN Federico Hopkins.    Selestino Dakin  Stroke Response RN

## 2023-07-18 ENCOUNTER — Observation Stay (HOSPITAL_COMMUNITY)

## 2023-07-18 DIAGNOSIS — G459 Transient cerebral ischemic attack, unspecified: Secondary | ICD-10-CM | POA: Diagnosis not present

## 2023-07-18 LAB — LIPID PANEL
Cholesterol: 209 mg/dL — ABNORMAL HIGH (ref 0–200)
HDL: 61 mg/dL (ref >40)
LDL Cholesterol: 126 mg/dL — ABNORMAL HIGH (ref 0–99)
Total CHOL/HDL Ratio: 3.4 ratio
Triglycerides: 110 mg/dL (ref <150)
VLDL: 22 mg/dL (ref 0–40)

## 2023-07-18 LAB — HEMOGLOBIN A1C
Hgb A1c MFr Bld: 5.7 % — ABNORMAL HIGH (ref 4.8–5.6)
Mean Plasma Glucose: 116.89 mg/dL

## 2023-07-18 MED ORDER — ONDANSETRON HCL 4 MG/2ML IJ SOLN: 4.0000 mg | Freq: Four times a day (QID) | INTRAMUSCULAR | Status: DC | PRN

## 2023-07-18 MED ORDER — CALCIUM GLUCONATE-NACL 2-0.675 GM/100ML-% IV SOLN
2.0000 g | Freq: Once | INTRAVENOUS | Status: AC
  Administered 2023-07-18: 2000 mg via INTRAVENOUS
  Filled 2023-07-18 (×2): qty 100

## 2023-07-18 MED ORDER — ACETAMINOPHEN 325 MG PO TABS: 650.0000 mg | ORAL_TABLET | Freq: Four times a day (QID) | ORAL | Status: DC | PRN

## 2023-07-18 MED ORDER — ASPIRIN 81 MG PO TBEC: 81.0000 mg | DELAYED_RELEASE_TABLET | Freq: Every day | ORAL | Status: AC

## 2023-07-18 MED ORDER — SENNOSIDES-DOCUSATE SODIUM 8.6-50 MG PO TABS: 1.0000 | ORAL_TABLET | Freq: Every evening | ORAL | Status: DC | PRN

## 2023-07-18 MED ORDER — ATORVASTATIN CALCIUM 80 MG PO TABS: 80.0000 mg | ORAL_TABLET | Freq: Every day | ORAL | 0 refills | Status: AC

## 2023-07-18 MED ORDER — CLOPIDOGREL BISULFATE 75 MG PO TABS: 75.0000 mg | ORAL_TABLET | Freq: Every day | ORAL | 0 refills | Status: AC

## 2023-07-18 MED ORDER — METOPROLOL TARTRATE 5 MG/5ML IV SOLN: 5.0000 mg | INTRAVENOUS | Status: DC | PRN

## 2023-07-18 MED ORDER — HYDRALAZINE HCL 20 MG/ML IJ SOLN: 10.0000 mg | INTRAMUSCULAR | Status: DC | PRN

## 2023-07-18 MED ORDER — CLOPIDOGREL BISULFATE 75 MG PO TABS
75.0000 mg | ORAL_TABLET | Freq: Every day | ORAL | Status: DC
  Administered 2023-07-18: 75 mg via ORAL
  Filled 2023-07-18: qty 1

## 2023-07-18 MED ORDER — TRAZODONE HCL 50 MG PO TABS: 50.0000 mg | ORAL_TABLET | Freq: Every evening | ORAL | Status: DC | PRN

## 2023-07-18 MED ORDER — GUAIFENESIN 100 MG/5ML PO LIQD: 5.0000 mL | ORAL | Status: DC | PRN

## 2023-07-18 MED ORDER — IPRATROPIUM-ALBUTEROL 0.5-2.5 (3) MG/3ML IN SOLN: 3.0000 mL | RESPIRATORY_TRACT | Status: DC | PRN

## 2023-07-18 MED ORDER — ATORVASTATIN CALCIUM 40 MG PO TABS
40.0000 mg | ORAL_TABLET | Freq: Every day | ORAL | Status: DC
  Administered 2023-07-18: 40 mg via ORAL
  Filled 2023-07-18: qty 1

## 2023-07-18 NOTE — ED Notes (Incomplete)
 Pt left with daughter in no new onset distress. Discharge paper review with pt, no questions.

## 2023-07-18 NOTE — Progress Notes (Signed)
 Transition of Care [facility]) - Inpatient Brief Assessment   Patient Details  Name: Logan Wolfe MRN: [id number] Date of Birth: 1945/08/10  Transition of Care [facility] CM/SW Contact:    Joanette Moynahan, RN Phone Number: 07/18/2023, 9:36 AM   Clinical Narrative:  Transition of Care Department [facility]) has reviewed patient and no TOC needs have been identified at this time. We will continue to monitor patient advancement through interdisciplinary progression rounds. If new patient transition needs arise, please place a TOC consult.     Transition of Care Asessment: Insurance and Status: Insurance coverage has been reviewed Patient has primary care physician: Yes Home environment has been reviewed: home alone Prior level of function:: independent Prior/Current Home Services: No current home services Social Drivers of Health Review: SDOH reviewed no interventions necessary Readmission risk has been reviewed: Yes Transition of care needs: no transition of care needs at this time

## 2023-07-18 NOTE — Hospital Course (Addendum)
 Brief Narrative:   78 y.o. male with medical history significant for dchf, ckd 3a, gi bleed, tavr earlier this year, prostate cancer stage 4 presents to the hospital with aphasia.  He played golf on the morning of admission and while driving to lunch he started having confusion with some aphasia therefore he pulled over.  EMS was called and he was brought to the hospital.  CT head, CT head and neck was unremarkable. MRI showed chronic CVA. Neuro recommended ASA plavix for 21 days followed by plavix. No need for echo prior to dc.   Assessment & Plan:  Principal Problem:   TIA (transient ischemic attack) Active Problems:   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HCC)   CKD stage 3a, GFR 45-59 ml/min (HCC)   S/P TAVR (transcatheter aortic valve replacement)   History of GI bleed   Obesity (BMI 30-39.9)   CAD (coronary artery disease)    Transient confusion, dysarthria, left arm weakness -CT head and CTA head and neck does not show any acute pathology.  MRI brain shows multiple old infarcts but nothing acute at this point.  LDL 126, A1c 5.7 -Aspirin , add Plavix for 21 days, thereafter plavix monotherapy -Add Lipitor -Discussed with Dr Doretta Gant on the day of dc.    Congestive heart failure with preserved EF Severe AS status post TAVR February 2025 Moderate mitral stenosis -Echocardiogram will be done outptn at his follow up cardiology visit.    History of stage IV prostate cancer Followed by urology oncology - cont home erleada  and orgovyx   Anemia of chronic disease -Hemoglobin around baseline 9.0  History of CAD - Continue home aspirin , statin, Toprol    Generalized anxiety disorder - home paxil    CKD stage IIIa -Creatinine around baseline 1.3     DVT prophylaxis: enoxaparin (LOVENOX) injection 40 mg Start: 07/17/23 1700      Code Status: Full Code Family Communication:  daughter at bedside DC today    Subjective: Feels back to his baseline without any symptoms./     Examination:  General exam: Appears calm and comfortable  Respiratory system: Clear to auscultation. Respiratory effort normal. Cardiovascular system: S1 & S2 heard, RRR. No JVD, murmurs, rubs, gallops or clicks. No pedal edema. Gastrointestinal system: Abdomen is nondistended, soft and nontender. No organomegaly or masses felt. Normal bowel sounds heard. Central nervous system: Alert and oriented. No focal neurological deficits. Extremities: Symmetric 5 x 5 power. Skin: No rashes, lesions or ulcers Psychiatry: Judgement and insight appear normal. Mood & affect appropriate.

## 2023-07-18 NOTE — ED Notes (Signed)
 Pt having headache, md messaged.

## 2023-07-18 NOTE — Discharge Summary (Signed)
 Physician Discharge Summary  Logan Wolfe IHK:[id number] DOB: [birth year] DOA: 07/17/2023  PCP: Jimmey Mould, MD  Admit date: 07/17/2023 Discharge date: 07/18/2023  Admitted From: Home Disposition:  Home  Recommendations for Outpatient Follow-up:  Follow up with PCP in 1-2 weeks Please obtain BMP/CBC in one week your next doctors visit.  Sttarted Lipitor 80mg  po daily ASA + plavix for 21 days followed by plavix monothrapy Outpatient follow up with cardiology.    Discharge Condition: Stable CODE STATUS: Full Diet recommendation: Cardiac  Brief Narrative:   78 y.o. male with medical history significant for dchf, ckd 3a, gi bleed, tavr earlier this year, prostate cancer stage 4 presents to the hospital with aphasia.  He played golf on the morning of admission and while driving to lunch he started having confusion with some aphasia therefore he pulled over.  EMS was called and he was brought to the hospital.  CT head, CT head and neck was unremarkable. MRI showed chronic CVA. Neuro recommended ASA plavix for 21 days followed by plavix. No need for echo prior to dc.   Assessment & Plan:  Principal Problem:   TIA (transient ischemic attack) Active Problems:   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HCC)   CKD stage 3a, GFR 45-59 ml/min (HCC)   S/P TAVR (transcatheter aortic valve replacement)   History of GI bleed   Obesity (BMI 30-39.9)   CAD (coronary artery disease)    Transient confusion, dysarthria, left arm weakness -CT head and CTA head and neck does not show any acute pathology.  MRI brain shows multiple old infarcts but nothing acute at this point.  LDL 126, A1c 5.7 -Aspirin , add Plavix for 21 days, thereafter plavix monotherapy -Add Lipitor -Discussed with Dr Doretta Gant on the day of dc.    Congestive heart failure with preserved EF Severe AS status post TAVR February 2025 Moderate mitral stenosis -Echocardiogram will be done outptn at his follow up cardiology visit.     History of stage IV prostate cancer Followed by urology oncology - cont home erleada  and orgovyx   Anemia of chronic disease -Hemoglobin around baseline 9.0  History of CAD - Continue home aspirin , statin, Toprol    Generalized anxiety disorder - home paxil    CKD stage IIIa -Creatinine around baseline 1.3     DVT prophylaxis: enoxaparin (LOVENOX) injection 40 mg Start: 07/17/23 1700      Code Status: Full Code Family Communication:  daughter at bedside DC today    Subjective: Feels back to his baseline without any symptoms./    Examination:  General exam: Appears calm and comfortable  Respiratory system: Clear to auscultation. Respiratory effort normal. Cardiovascular system: S1 & S2 heard, RRR. No JVD, murmurs, rubs, gallops or clicks. No pedal edema. Gastrointestinal system: Abdomen is nondistended, soft and nontender. No organomegaly or masses felt. Normal bowel sounds heard. Central nervous system: Alert and oriented. No focal neurological deficits. Extremities: Symmetric 5 x 5 power. Skin: No rashes, lesions or ulcers Psychiatry: Judgement and insight appear normal. Mood & affect appropriate.    Discharge Diagnoses:  Principal Problem:   TIA (transient ischemic attack) Active Problems:   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HCC)   CKD stage 3a, GFR 45-59 ml/min (HCC)   S/P TAVR (transcatheter aortic valve replacement)   History of GI bleed   Obesity (BMI 30-39.9)   CAD (coronary artery disease)  Discharge Exam: Vitals:   07/18/23 0840 07/18/23 1158  BP: 124/67 126/65  Pulse: (!) 54 (!) 51  Resp: 17 13  Temp:  98.2 F (36.8 C)  SpO2: 100% 99%   Vitals:   07/18/23 0550 07/18/23 0800 07/18/23 0840 07/18/23 1158  BP:   124/67 126/65  Pulse:   (!) 54 (!) 51  Resp:   17 13  Temp: 97.7 F (36.5 C) 98 F (36.7 C)  98.2 F (36.8 C)  TempSrc: Oral Oral  Oral  SpO2:   100% 99%  Weight:          Discharge Instructions   Allergies as of  07/18/2023       Reactions   Adoxa [doxycycline] Other (See Comments)   Burning feeling on tongue   Neosporin [bacitracin-polymyxin B] Other (See Comments)   Skin irritation Redness on contact   Penicillins Rash        Medication List     STOP taking these medications    azithromycin  500 MG tablet Commonly known as: Zithromax        TAKE these medications    Advil 200 MG tablet Generic drug: ibuprofen Take 400-600 mg by mouth daily as needed for headache or moderate pain (pain score 4-6).   aspirin  EC 81 MG tablet Take 1 tablet (81 mg total) by mouth daily for 21 days.   atorvastatin 80 MG tablet Commonly known as: Lipitor Take 1 tablet (80 mg total) by mouth daily.   buPROPion 150 MG 24 hr tablet Commonly known as: WELLBUTRIN XL Take 150 mg by mouth daily.   CALCIUM  PO Take 2 tablets by mouth daily.   clopidogrel 75 MG tablet Commonly known as: PLAVIX Take 1 tablet (75 mg total) by mouth daily. Start taking on: July 19, 2023   Erleada  60 MG tablet Generic drug: apalutamide  Take 240 mg by mouth daily. Take 4 tabs daily   fluticasone 50 MCG/ACT nasal spray Commonly known as: FLONASE Place 1-2 sprays into both nostrils daily as needed for allergies.   IRON  PO Take 1 tablet by mouth daily.   metoprolol  succinate 50 MG 24 hr tablet Commonly known as: TOPROL -XL Take 1 tablet (50 mg total) by mouth daily. Take with or immediately following a meal.   Orgovyx  120 MG tablet Generic drug: relugolix  Take 240 mg by mouth daily.   PARoxetine  20 MG tablet Commonly known as: PAXIL  Take 20 mg by mouth daily.   TUMS PO Take 1 tablet by mouth daily as needed (heartburn, indigestion).        Allergies  Allergen Reactions   Adoxa [Doxycycline] Other (See Comments)    Burning feeling on tongue   Neosporin [Bacitracin-Polymyxin B] Other (See Comments)    Skin irritation Redness on contact   Penicillins Rash    You were cared for by a hospitalist during  your hospital stay. If you have any questions about your discharge medications or the care you received while you were in the hospital after you are discharged, you can call the unit and asked to speak with the hospitalist on call if the hospitalist that took care of you is not available. Once you are discharged, your primary care physician will handle any further medical issues. Please note that no refills for any discharge medications will be authorized once you are discharged, as it is imperative that you return to your primary care physician (or establish a relationship with a primary care physician if you do not have one) for your aftercare needs so that they can reassess your need for medications and monitor your lab values.  You were cared for by a hospitalist during  your hospital stay. If you have any questions about your discharge medications or the care you received while you were in the hospital after you are discharged, you can call the unit and asked to speak with the hospitalist on call if the hospitalist that took care of you is not available. Once you are discharged, your primary care physician will handle any further medical issues. Please note that NO REFILLS for any discharge medications will be authorized once you are discharged, as it is imperative that you return to your primary care physician (or establish a relationship with a primary care physician if you do not have one) for your aftercare needs so that they can reassess your need for medications and monitor your lab values.  Please request your Prim.MD to go over all Hospital Tests and Procedure/Radiological results at the follow up, please get all Hospital records sent to your Prim MD by signing hospital release before you go home.  Get CBC, CMP, 2 view Chest X ray checked  by Primary MD during your next visit or SNF MD in 5-7 days ( we routinely change or add medications that can affect your baseline labs and fluid status,  therefore we recommend that you get the mentioned basic workup next visit with your PCP, your PCP may decide not to get them or add new tests based on their clinical decision)  On your next visit with your primary care physician please Get Medicines reviewed and adjusted.  If you experience worsening of your admission symptoms, develop shortness of breath, life threatening emergency, suicidal or homicidal thoughts you must seek medical attention immediately by calling 911 or calling your MD immediately  if symptoms less severe.  You Must read complete instructions/literature along with all the possible adverse reactions/side effects for all the Medicines you take and that have been prescribed to you. Take any new Medicines after you have completely understood and accpet all the possible adverse reactions/side effects.   Do not drive, operate heavy machinery, perform activities at heights, swimming or participation in water activities or provide baby sitting services if your were admitted for syncope or siezures until you have seen by Primary MD or a Neurologist and advised to do so again.  Do not drive when taking Pain medications.   Procedures/Studies: DG CHEST PORT 1 VIEW Result Date: 07/17/2023 CLINICAL DATA:  Dyspnea. EXAM: PORTABLE CHEST 1 VIEW COMPARISON:  March 16, 2023. FINDINGS: The heart size and mediastinal contours are within normal limits. Both lungs are clear. The visualized skeletal structures are unremarkable. IMPRESSION: No active disease. Electronically Signed   By: Rosalene Colon M.D.   On: 07/17/2023 18:20   MR Brain W and Wo Contrast Result Date: 07/17/2023 CLINICAL DATA:  Provided history: Neuro deficit, acute, stroke suspected. EXAM: MRI HEAD WITHOUT AND WITH CONTRAST TECHNIQUE: Multiplanar, multiecho pulse sequences of the brain and surrounding structures were obtained without and with intravenous contrast. CONTRAST:  10mL GADAVIST GADOBUTROL 1 MMOL/ML IV SOLN COMPARISON:   Non-contrast head CT and CT angiogram head/neck 07/17/2023. Brain MRI 12/12/2012. FINDINGS: Brain: Mild generalized generalized cerebral atrophy. Multifocal T2 FLAIR hyperintense signal abnormality within the cerebral white matter, nonspecific but compatible with mild for age chronic small vessel ischemic disease. Two chronic microhemorrhages within the right parietal lobe. Tiny chronic infarct within the inferior left cerebellar hemisphere, new from the prior MRI of 12/12/2012 (series 5, image 5). Moderate-sized chronic infarct within the inferior right cerebellar hemisphere, unchanged. There is no acute infarct. No evidence of  an intracranial mass. No extra-axial fluid collection. No midline shift. No pathologic intracranial enhancement identified. Vascular: Maintained flow voids within the proximal large arterial vessels. 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 Skull and upper cervical spine: No focal worrisome round lesion. Sinuses/Orbits: No mass or acute finding within the imaged orbits. No significant paranasal sinus disease. IMPRESSION: 1.  No evidence of an acute intracranial abnormality. 2. Mild cerebral white matter chronic small vessel ischemic disease, progressed since the MRI of 12/12/2012. 3. Moderate-sized chronic infarct within the right cerebellar hemisphere, unchanged. 4. Tiny chronic infarct within the inferior left cerebellar hemisphere, new. 5. Cerebral atrophy. Electronically Signed   By: Bascom Lily D.O.   On: 07/17/2023 17:50   CT ANGIO HEAD NECK W WO CM Result Date: 07/17/2023 CLINICAL DATA:  Neuro deficit, concern for stroke.  Aphasia. EXAM: CT ANGIOGRAPHY HEAD AND NECK WITH AND WITHOUT CONTRAST TECHNIQUE: Multidetector CT imaging of the head and neck was performed using the standard protocol during bolus administration of intravenous contrast. Multiplanar CT image reconstructions and MIPs were obtained to evaluate the vascular anatomy. Carotid stenosis  measurements (when applicable) are obtained utilizing NASCET criteria, using the distal internal carotid diameter as the denominator. RADIATION DOSE REDUCTION: This exam was performed according to the departmental dose-optimization program which includes automated exposure control, adjustment of the mA and/or kV according to patient size and/or use of iterative reconstruction technique. CONTRAST:  75mL OMNIPAQUE  IOHEXOL  350 MG/ML SOLN COMPARISON:  Earlier same day CT head. FINDINGS: CTA NECK FINDINGS Aortic arch: Standard configuration of the aortic arch. Imaged portion shows no evidence of aneurysm or dissection. Mild atherosclerosis. No significant stenosis of the major arch vessel origins. Pulmonary arteries: As permitted by contrast timing, there are no filling defects in the visualized pulmonary arteries. Subclavian arteries: The subclavian arteries are patent bilaterally. Right carotid system: Patent. Mild atherosclerosis at the carotid bifurcation without hemodynamically significant stenosis. No evidence of dissection. Left carotid system: Patent. Mild atherosclerosis at the carotid bifurcation without hemodynamically significant stenosis. No evidence of dissection. Vertebral arteries: Codominant. No evidence of dissection, stenosis (50% or greater), or occlusion. Atherosclerosis along the left V3 segment without stenosis. Skeleton: No acute findings. Degenerative changes in the cervical spine. Other neck: The visualized airway is patent. No cervical lymphadenopathy. Upper chest: Visualized lung apices are clear. Review of the MIP images confirms the above findings CTA HEAD FINDINGS ANTERIOR CIRCULATION: The intracranial ICAs are patent bilaterally. Mild atherosclerosis of the carotid siphons. No significant stenosis, proximal occlusion, aneurysm, or vascular malformation. MCAs: The middle cerebral arteries are patent bilaterally. ACAs: The anterior cerebral arteries are patent bilaterally. POSTERIOR  CIRCULATION: No significant stenosis, proximal occlusion, aneurysm, or vascular malformation. PCAs: The posterior cerebral arteries are patent bilaterally. Pcomm: Not well visualized. SCAs: The superior cerebellar arteries are patent bilaterally. Basilar artery: Patent AICAs: Small caliber visualized on the right. PICAs: Visualized on the left. Vertebral arteries: The intracranial vertebral arteries are patent. Venous sinuses: As permitted by contrast timing, patent. Anatomic variants: None Review of the MIP images confirms the above findings Review of the MIP images confirms the above findings IMPRESSION: No large vessel occlusion. No high-grade stenosis, aneurysm, or dissection of the arteries in the head and neck. Atherosclerosis at the carotid bifurcations without hemodynamically significant stenosis. Aortic Atherosclerosis (ICD10-I70.0). Electronically Signed   By: Denny Flack M.D.   On: 07/17/2023 15:12   CT HEAD CODE STROKE WO CONTRAST Result Date: 07/17/2023 CLINICAL DATA:  Code stroke. Neuro deficit, concern for stroke, mild aphasia.  EXAM: CT HEAD WITHOUT CONTRAST TECHNIQUE: Contiguous axial images were obtained from the base of the skull through the vertex without intravenous contrast. RADIATION DOSE REDUCTION: This exam was performed according to the departmental dose-optimization program which includes automated exposure control, adjustment of the mA and/or kV according to patient size and/or use of iterative reconstruction technique. COMPARISON:  MRI head 12/12/2012. FINDINGS: Brain: No acute intracranial hemorrhage. No CT evidence of acute infarct. Nonspecific hypoattenuation in the periventricular and subcortical white matter favored to reflect chronic microvascular ischemic changes. 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 No edema, mass effect, or midline shift. The basilar cisterns are patent. Ventricles: The ventricles are normal. Vascular: Atherosclerotic calcifications  of the carotid siphons. No hyperdense vessel. Skull: No acute or aggressive finding. Orbits: Orbits are symmetric. Sinuses: The visualized paranasal sinuses are clear. Other: Mastoid air cells are clear. ASPECTS [facility] Stroke Program Early CT Score) - Ganglionic level infarction (caudate, lentiform nuclei, internal capsule, insula, M1-M3 cortex): 7 - Supraganglionic infarction (M4-M6 cortex): 3 Total score (0-10 with 10 being normal): 10 IMPRESSION: 1. No CT evidence of acute intracranial abnormality. 2. ASPECTS is 10 These results were communicated to Dr. Doretta Gant At 1:50 pm on 07/17/2023 by text page via the [facility] messaging system. Electronically Signed   By: Denny Flack M.D.   On: 07/17/2023 13:50     The results of significant diagnostics from this hospitalization (including imaging, microbiology, ancillary and laboratory) are listed below for reference.     Microbiology: No results found for this or any previous visit (from the past 240 hours).   Labs: BNP (last 3 results) Recent Labs    01/08/23 0220 07/17/23 1339  BNP 610.0* 210.6*   Basic Metabolic Panel: Recent Labs  Lab 07/17/23 1339 07/17/23 1341  NA 138 137  K 3.9 3.9  CL 106 106  CO2 18*  --   GLUCOSE 115* 115*  BUN 27* 28*  CREATININE 1.33* 1.30*  CALCIUM  9.6  --    Liver Function Tests: Recent Labs  Lab 07/17/23 1339  AST 26  ALT 14  ALKPHOS 52  BILITOT 0.4  PROT 6.6  ALBUMIN 3.9   No results for input(s): "LIPASE", "AMYLASE" in the last 168 hours. No results for input(s): "AMMONIA" in the last 168 hours. CBC: Recent Labs  Lab 07/17/23 1339 07/17/23 1341  WBC 3.7*  --   NEUTROABS 2.3  --   HGB 8.8* 9.2*  HCT 26.8* 27.0*  MCV 89.6  --   PLT 199  --    Cardiac Enzymes: No results for input(s): "CKTOTAL", "CKMB", "CKMBINDEX", "TROPONINI" in the last 168 hours. BNP: Invalid input(s): "POCBNP" CBG: Recent Labs  Lab 07/17/23 1336  GLUCAP 111*   D-Dimer Recent Labs    07/17/23 1339  DDIMER  0.57*   Hgb A1c Recent Labs    07/18/23 0713  HGBA1C 5.7*   Lipid Profile Recent Labs    07/18/23 0714  CHOL 209*  HDL 61  LDLCALC 126*  TRIG 110  CHOLHDL 3.4   Thyroid  function studies No results for input(s): "TSH", "T4TOTAL", "T3FREE", "THYROIDAB" in the last 72 hours.  Invalid input(s): "FREET3" Anemia work up No results for input(s): "VITAMINB12", "FOLATE", "FERRITIN", "TIBC", "IRON ", "RETICCTPCT" in the last 72 hours. Urinalysis    Component Value Date/Time   COLORURINE YELLOW 10/18/2008 1519   APPEARANCEUR CLOUDY (A) 10/18/2008 1519   LABSPEC 1.024 10/18/2008 1519   PHURINE 7.5 10/18/2008 1519   GLUCOSEU NEGATIVE 10/18/2008 1519   HGBUR  NEGATIVE 10/18/2008 1519   BILIRUBINUR NEGATIVE 10/18/2008 1519   KETONESUR NEGATIVE 10/18/2008 1519   PROTEINUR NEGATIVE 10/18/2008 1519   UROBILINOGEN 0.2 10/18/2008 1519   NITRITE NEGATIVE 10/18/2008 1519   LEUKOCYTESUR  10/18/2008 1519    NEGATIVE MICROSCOPIC NOT DONE ON URINES WITH NEGATIVE PROTEIN, BLOOD, LEUKOCYTES, NITRITE, OR GLUCOSE <1000 mg/dL.   Sepsis Labs Recent Labs  Lab 07/17/23 1339  WBC 3.7*   Microbiology No results found for this or any previous visit (from the past 240 hours).   Time coordinating discharge:  I have spent 35 minutes face to face with the patient and on the ward discussing the patients care, assessment, plan and disposition with other care givers. >50% of the time was devoted counseling the patient about the risks and benefits of treatment/Discharge disposition and coordinating care.   SIGNED:   Maggie Schooner, MD  [facility] 07/18/2023, 12:36 PM   If 7PM-7AM, please contact night-coverage

## 2023-07-18 NOTE — Care Management Obs Status (Signed)
 MEDICARE OBSERVATION STATUS NOTIFICATION   Patient Details  Name: Logan Wolfe MRN: [id number] Date of Birth: 03-Feb-1946   Medicare Observation Status Notification Given:  Yes    Joanette Moynahan, RN 07/18/2023, 9:20 AM

## 2023-07-18 NOTE — Evaluation (Signed)
 Physical Therapy Evaluation & Discharge Patient Details Name: Logan Wolfe MRN: [id number] DOB: 1945/02/24 Today's Date: 07/18/2023  History of Present Illness  Patient is a 78 y/o male seen in the ED on 07/17/23 due to aphasia and L arm weakness.  PMH positive for CHF, GIB, TAVR earlier this year, prostate cancer stage 4, spinal stenosis.  Clinical Impression  Patient presents with mobility at his functional baseline. Patient educated in stroke warning signs and possibly setting up alert system for EMS vs family since he reports remembering difficulty working his car and phone during this episode.  No further skilled PT needs at this time.  Will sign off.          If plan is discharge home, recommend the following:     Can travel by private vehicle        Equipment Recommendations None recommended by PT  Recommendations for Other Services       Functional Status Assessment Patient has not had a recent decline in their functional status     Precautions / Restrictions Precautions Precautions: None      Mobility  Bed Mobility Overal bed mobility: Modified Independent                  Transfers Overall transfer level: Modified independent                      Ambulation/Gait Ambulation/Gait assistance: Modified independent (Device/Increase time) Gait Distance (Feet): 200 Feet Assistive device: None Gait Pattern/deviations: Step-through pattern, Decreased stride length, Wide base of support       General Gait Details: completed DGI, patient denies difficulties  Stairs            Wheelchair Mobility     Tilt Bed    Modified Rankin (Stroke Patients Only) Modified Rankin (Stroke Patients Only) Pre-Morbid Rankin Score: No significant disability Modified Rankin: No significant disability     Balance                                 Standardized Balance Assessment Standardized Balance Assessment : Dynamic Gait Index   Dynamic  Gait Index Level Surface: Normal Change in Gait Speed: Normal Gait with Horizontal Head Turns: Normal Gait with Vertical Head Turns: Normal Gait and Pivot Turn: Normal Step Over Obstacle: Normal Step Around Obstacles: Normal Steps: Severe Impairment (did not practice due to distance from ED Room) Total Score: 21       Pertinent Vitals/Pain Pain Assessment Pain Assessment: Faces Faces Pain Scale: Hurts a little bit Pain Location: chronic back pain Pain Descriptors / Indicators: Discomfort Pain Intervention(s): Monitored during session    Home Living Family/patient expects to be discharged to:: Private residence Living Arrangements: Alone   Type of Home: House Home Access: Stairs to enter   [employer] of Steps: 1 in front, 2 in back to patio Alternate Level Stairs-Number of Steps: office on second floor flight with rail on R Home Layout: Two level Home Equipment: Grab bars - tub/shower;Shower seat - built in      Prior Function Prior Level of Function : Independent/Modified Independent               ADLs Comments: wife passed 4 months ago, has been eating out mostly though still "mending" from losing his wife     Extremity/Trunk Assessment        Lower Extremity Assessment Lower Extremity  Assessment: LLE deficits/detail;RLE deficits/detail RLE Deficits / Details: cramping in calves and thighs, great toe numbness but otherwise WFL LLE Deficits / Details: cramping in calves and thighs, great toe numbness but otherwise WFL    Cervical / Trunk Assessment Cervical / Trunk Assessment: Other exceptions Cervical / Trunk Exceptions: chronic low back pain  Communication        Cognition Arousal: Alert Behavior During Therapy: WFL for tasks assessed/performed                             Following commands: Intact       Cueing       General Comments General comments (skin integrity, edema, etc.): Educated in stroke warning signs and  symptoms and in need for conversation with family about possibly life alert system as pt really concerned that during episode could not figure out how to work the radio or call someone and does not remember calling his son who reports when he did call he could not talk.    Exercises     Assessment/Plan    PT Assessment Patient does not need any further PT services  PT Problem List         PT Treatment Interventions      PT Goals (Current goals can be found in the Care Plan section)  Acute Rehab PT Goals PT Goal Formulation: All assessment and education complete, DC therapy    Frequency       Co-evaluation               AM-PAC PT "6 Clicks" Mobility  Outcome Measure Help needed turning from your back to your side while in a flat bed without using bedrails?: None Help needed moving from lying on your back to sitting on the side of a flat bed without using bedrails?: None Help needed moving to and from a bed to a chair (including a wheelchair)?: None Help needed standing up from a chair using your arms (e.g., wheelchair or bedside chair)?: None Help needed to walk in hospital room?: None Help needed climbing 3-5 steps with a railing? : None 6 Click Score: 24    End of Session Equipment Utilized During Treatment: Gait belt Activity Tolerance: Patient tolerated treatment well Patient left: in bed   PT Visit Diagnosis: Other symptoms and signs involving the nervous system (R29.898)    Time: 1610-9604 PT Time Calculation (min) (ACUTE ONLY): 25 min   Charges:   PT Evaluation $PT Eval Low Complexity: 1 Low PT Treatments $Self Care/Home Management: 8-22 PT General Charges $$ ACUTE PT VISIT: 1 Visit         Abigail Hoff, PT Acute Rehabilitation Services Office:[phone] 07/18/2023   Logan Wolfe 07/18/2023, 11:54 AM

## 2023-07-18 NOTE — Progress Notes (Signed)
 OT Cancellation Note  Patient Details Name: Logan Wolfe MRN: [id number] DOB: [birth year]   Cancelled Treatment:    Reason Eval/Treat Not Completed: OT screened, no needs identified, will sign off. Per PT, ok to screen with pr back to baseline.   Karilyn Ouch, OTR/L [facility] Acute Rehabilitation Office: [phone]   [name] 07/18/2023, 11:34 AM

## 2023-07-20 DIAGNOSIS — H6991 Unspecified Eustachian tube disorder, right ear: Secondary | ICD-10-CM | POA: Diagnosis not present

## 2023-07-20 DIAGNOSIS — H6121 Impacted cerumen, right ear: Secondary | ICD-10-CM | POA: Diagnosis not present

## 2023-07-26 NOTE — Progress Notes (Signed)
 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 TTE 04/13/23: EF 55-60, no RWMA, Gr 1 DD, NL RVSF, NL PASP, RVSP 29.4, mod LAE, mod MS, mean MV 7 mmHg, TAVI w mild PVL, mean 8.2 mmHg (HFpEF) heart failure with preserved ejection fraction  Supraventricular Tachycardia Mitral stenosis Mod MS by TTE in 03/2023  Left Bundle Branch Block  Post TAVI - monitor 03/2023 w/o high grade HB, no AF Pulmonary hypertension Metastatic prostate CA s/p resection/XRT/hormonal Rx Chronic kidney disease Hx of TIA 07/2023 MRI: no acute CVA; small vessel ischemic ?, R cerebellar chronic CVA, tiny inf L cerebellar CVA Anemia Hx of GI bleed Colo with multiple bleeding colonic angioectasias, Tx w APC GERD PCN allergy       Discussed the use of AI scribe software for clinical note transcription with the patient, who gave verbal consent to proceed. History of Present Illness Logan Wolfe is a 78 y.o. male  He was last seen by Abagail Hoar, PA-C for structural heart follow up.  He called in recently with transient shortness of breath and was set up for follow up. He was admitted 6/3-6/4 with a TIA.  He presented with confusion and aphasia.  His symptoms resolved.  He did not require thrombolytics.  CT of the head was unremarkable.  There was no large vessel occlusion on CTA.  MRI showed multiple old infarcts but nothing acute.  Neurology recommended the addition of clopidogrel  to aspirin  for 21 days, then clopidogrel  monotherapy.  Atorvastatin  was added to his medical regimen.  He is here alone. He notes shortness of breath with exertion. This improved after TAVI but has recently  recurred. He cannot walk fast without needing to stop and catch his breath.  He also notes dizziness with bending over and standing up.  He has also had nausea and vomiting. He has vomited three times in the last six months, which is unusual for him.  He notes increased indigestion.  He has not had leg swelling, orthopnea, or paroxysmal nocturnal dyspnea. He notes associated chest discomfort with exertion, described as a 'little' pressure or heaviness. No radiation of discomfort to the jaw or arms.    ROS:  He notes a small bit of bleeding with bowel movements, which he attributes to hard stools or straining. He reports weight gain of about six pounds. He experiences indigestion and heartburn, sometimes without having eaten anything, and takes Tums occasionally. He denies any stomach pain unless he feels nauseous, which is accompanied by cramping.    Studies Reviewed:      Results Total cholesterol: 209 (07/18/2023) HDL: 61 (07/18/2023) LDL: 409 (07/18/2023) Hemoglobin: 9.2 (07/18/2023) Creatinine: 1.3 (07/18/2023) Potassium: 3.9 (07/18/2023) ALT: 14 (07/18/2023) Hemoglobin: 9.7 (03/2023) Hemoglobin: 8.8 (07/2023)  Risk Assessment/Calculations:        Physical Exam:  VS:  BP 122/64   Pulse 74   Ht 6' 2 (1.88 m)   Wt 235 lb (106.6 kg)   SpO2 92%   BMI 30.17 kg/m    Wt Readings from Last 3 Encounters:  07/27/23 235 lb (106.6 kg)  07/17/23 238 lb 8.6 oz (108.2 kg)  04/13/23 226  lb (102.5 kg)    Constitutional:      Appearance: Healthy appearance. Not in distress.  Eyes:     Comments: Palpebral conjunctivae are pale Neck:     Vascular: No JVR. JVD normal.  Pulmonary:     Breath sounds: Normal breath sounds. No wheezing. No rales.  Cardiovascular:     Normal rate. Regular rhythm.     Murmurs: There is a grade 2/6 systolic murmur at the URSB. There is a grade 2/4 diastolic murmur at the LLSB.  Edema:    Peripheral edema absent.  Abdominal:     Palpations: Abdomen is soft.   Skin:    General: Skin is warm and dry.      Assessment and Plan: Assessment & Plan Shortness of breath Etiology for his dyspnea is not entirely clear. DDx includes ischemia/angina, worsening aortic insufficiency (paravalvular leak), worsening anemia, acute congestive heart failure. He has CAD with a hx of NSTEMI in 12/2022 in the setting of GI bleed. There was high grade RCA disease and subtotal occlusion in a small PDA. Med Rx was recommended. He is s/p TAVI for aortic stenosis in 03/2023. Post op TTE showed mild PVL. His diastolic murmur is prominent on exam. He is due for follow up TTE in 02/2024. He has a hx of anemia and prior GI bleed. He required APC for angioectasias in his colon. His recent Hgb remained low. He notes a lot of indigestion, nausea and vomiting. He has a hx of HFpEF. When he was admitted for TIA, his BNP was minimally elevated. He does not appear volume overloaded on exam but his weight is up.  - Arrange for TTE to be done earlier to recheck TAVI, PVL, EF - Labs today: CMET, CBC, TSH, NT Pro BNP - Start Imdur 15 mg once daily  - Start Protonix 40 mg once daily  - Follow up with GI for anemia, GERD symptoms, nausea and vomiting - Follow up 6-8 weeks  Severe aortic stenosis S/p TAVI in February 2025. F/u echocardiogram in 03/2023 showed mild paravalvular leak. As noted, he has a diastolic murmur on exam. Given symptoms, I would like him to get his follow up echocardiogram sooner to recheck severity of PVL. - Obtain echocardiogram in the next 2-3 weeks.  Coronary artery disease involving native coronary artery of native heart with angina pectoris (HCC) Hx of NSTEMI in the setting of GI bleed in 12/2022. He had 80% pRCA and 99% stenosis in a small PDA. Med Rx was recommended at that time. He notes anginal symptoms with chest pain and shortness of breath with exertion. EKG done in the hospital showed known LBBB. If his anemia is worse, this could explain his symptoms. However, if  he continues to have symptoms and his hemoglobin is stable or improved and the rest of his workup is unremarkable, we may need to consider cardiac catheterization. - Continue atorvastatin  80 mg daily - Continue aspirin  81 mg daily - Continue Plavix  75 mg daily - Continue metoprolol  succinate 50 mg daily - Start Imdur 15 mg daily - Consider cardiac catheterization if symptoms persist despite medical management and no other reason to explain symptoms Chronic heart failure with preserved ejection fraction (HCC) EF normal by echocardiogram in 03/2023. He has had a weight gain but there is no evidence of rales on exam or distended neck veins or significant leg edema.  - Will get echocardiogram done sooner as noted - NT Pro BNP today. Consider furosemide  if elevated.  History of  TIA (transient ischemic attack) Recent admission with aphasia and confusion. These symptoms resolved. There were no acute infarcts on MRI. Neurology recommended DAPT x 21 days, then Clopidogrel  alone.  Anemia, unspecified type He has a hx of GI bleed. He has been anemic. Question if worsening anemia is contributing to his current symptoms. - Repeat CBC today Mitral valve stenosis, unspecified etiology Mod by TTE in 03/2023. Repeat echocardiogram as noted.  Gastroesophageal reflux disease, unspecified whether esophagitis present He notes a lot of nausea, vomiting and indigestion symptoms. He has not had hematemesis. I do not think his n/v is cardiac.  - Start Protonix 40 mg once daily  - CMET, CBC today - I have asked him to call GI for earlier follow up/evaluation       Dispo:  No follow-ups on file. Signed, Marlyse Single, PA-C

## 2023-07-27 ENCOUNTER — Ambulatory Visit: Attending: Physician Assistant | Admitting: Physician Assistant

## 2023-07-27 ENCOUNTER — Encounter: Payer: Self-pay | Admitting: Physician Assistant

## 2023-07-27 VITALS — BP 122/64 | HR 74 | Ht 74.0 in | Wt 235.0 lb

## 2023-07-27 DIAGNOSIS — I5032 Chronic diastolic (congestive) heart failure: Secondary | ICD-10-CM

## 2023-07-27 DIAGNOSIS — I25119 Atherosclerotic heart disease of native coronary artery with unspecified angina pectoris: Secondary | ICD-10-CM

## 2023-07-27 DIAGNOSIS — I350 Nonrheumatic aortic (valve) stenosis: Secondary | ICD-10-CM

## 2023-07-27 DIAGNOSIS — N1831 Chronic kidney disease, stage 3a: Secondary | ICD-10-CM

## 2023-07-27 DIAGNOSIS — Z8673 Personal history of transient ischemic attack (TIA), and cerebral infarction without residual deficits: Secondary | ICD-10-CM

## 2023-07-27 DIAGNOSIS — R0602 Shortness of breath: Secondary | ICD-10-CM | POA: Diagnosis not present

## 2023-07-27 DIAGNOSIS — I25118 Atherosclerotic heart disease of native coronary artery with other forms of angina pectoris: Secondary | ICD-10-CM

## 2023-07-27 DIAGNOSIS — I050 Rheumatic mitral stenosis: Secondary | ICD-10-CM

## 2023-07-27 DIAGNOSIS — K219 Gastro-esophageal reflux disease without esophagitis: Secondary | ICD-10-CM | POA: Diagnosis not present

## 2023-07-27 DIAGNOSIS — D649 Anemia, unspecified: Secondary | ICD-10-CM

## 2023-07-27 MED ORDER — ISOSORBIDE MONONITRATE ER 30 MG PO TB24
15.0000 mg | ORAL_TABLET | Freq: Every day | ORAL | 11 refills | Status: AC
Start: 2023-07-27 — End: ?

## 2023-07-27 MED ORDER — PANTOPRAZOLE SODIUM 40 MG PO TBEC
40.0000 mg | DELAYED_RELEASE_TABLET | Freq: Every day | ORAL | 3 refills | Status: DC
Start: 2023-07-27 — End: 2023-11-26

## 2023-07-27 NOTE — Assessment & Plan Note (Signed)
 S/p TAVI in February 2025. F/u echocardiogram in 03/2023 showed mild paravalvular leak. As noted, he has a diastolic murmur on exam. Given symptoms, I would like him to get his follow up echocardiogram sooner to recheck severity of PVL. - Obtain echocardiogram in the next 2-3 weeks.

## 2023-07-27 NOTE — Assessment & Plan Note (Addendum)
 Hx of NSTEMI in the setting of GI bleed in 12/2022. He had 80% pRCA and 99% stenosis in a small PDA. Med Rx was recommended at that time. He notes anginal symptoms with chest pain and shortness of breath with exertion. EKG done in the hospital showed known LBBB. If his anemia is worse, this could explain his symptoms. However, if he continues to have symptoms and his hemoglobin is stable or improved and the rest of his workup is unremarkable, we may need to consider cardiac catheterization. - Continue atorvastatin  80 mg daily - Continue aspirin  81 mg daily - Continue Plavix  75 mg daily - Continue metoprolol  succinate 50 mg daily - Start Imdur 15 mg daily - Consider cardiac catheterization if symptoms persist despite medical management and no other reason to explain symptoms

## 2023-07-27 NOTE — Assessment & Plan Note (Signed)
 He notes a lot of nausea, vomiting and indigestion symptoms. He has not had hematemesis. I do not think his n/v is cardiac.  - Start Protonix 40 mg once daily  - CMET, CBC today - I have asked him to call GI for earlier follow up/evaluation

## 2023-07-27 NOTE — Patient Instructions (Signed)
 Medication Instructions:  Your medication has been sent to your phamacy. *If you need a refill on your cardiac medications before your next appointment, please call your pharmacy*   Lab Work: Labs will be drawn today. If you have labs (blood work) drawn today and your tests are completely normal, you will receive your results only by: MyChart Message (if you have MyChart) OR A paper copy in the mail If you have any lab test that is abnormal or we need to change your treatment, we will call you to review the results.   Testing/Procedures: Your physician has requested that you have an echocardiogram in 2 weeks. Echocardiography is a painless test that uses sound waves to create images of your heart. It provides your doctor with information about the size and shape of your heart and how well your heart's chambers and valves are working. This procedure takes approximately one hour. There are no restrictions for this procedure. Please do NOT wear cologne, perfume, aftershave, or lotions (deodorant is allowed). Please arrive 15 minutes prior to your appointment time.  Please note: We ask at that you not bring children with you during ultrasound (echo/ vascular) testing. Due to room size and safety concerns, children are not allowed in the ultrasound rooms during exams. Our front office staff cannot provide observation of children in our lobby area while testing is being conducted. An adult accompanying a patient to their appointment will only be allowed in the ultrasound room at the discretion of the ultrasound technician under special circumstances. We apologize for any inconvenience.    Follow-Up: At [facility], you and your health needs are our priority.  As part of our continuing mission to provide you with exceptional heart care, we have created designated Provider Care Teams.  These Care Teams include your primary Cardiologist (physician) and Advanced Practice Providers (APPs -   Physician Assistants and Nurse Practitioners) who all work together to provide you with the care you need, when you need it.  We recommend signing up for the patient portal called MyChart.  Sign up information is provided on this After Visit Summary.  MyChart is used to connect with patients for Virtual Visits (Telemedicine).  Patients are able to view lab/test results, encounter notes, upcoming appointments, etc.  Non-urgent messages can be sent to your provider as well.   To learn more about what you can do with MyChart, go to [email or web address].    Your next appointment:   6 month(s)  Provider:   Arnoldo Lapping, MD or Marlyse Single    Other Instructions Thank you for choosing [facility]!     A letter will be mailed to you as a reminder to call the office for your next follow up appointment.

## 2023-07-28 ENCOUNTER — Ambulatory Visit: Payer: Self-pay | Admitting: Physician Assistant

## 2023-07-28 DIAGNOSIS — I350 Nonrheumatic aortic (valve) stenosis: Secondary | ICD-10-CM

## 2023-07-28 LAB — TSH: TSH: 2.66 u[IU]/mL (ref 0.450–4.500)

## 2023-07-28 LAB — COMPREHENSIVE METABOLIC PANEL WITH GFR
ALT: 13 IU/L (ref 0–44)
AST: 18 IU/L (ref 0–40)
Albumin: 4.3 g/dL (ref 3.8–4.8)
Alkaline Phosphatase: 84 IU/L (ref 44–121)
BUN/Creatinine Ratio: 15 (ref 10–24)
BUN: 20 mg/dL (ref 8–27)
CO2: 19 mmol/L — ABNORMAL LOW (ref 20–29)
Calcium: 9.6 mg/dL (ref 8.6–10.2)
Chloride: 103 mmol/L (ref 96–106)
Creatinine, Ser: 1.37 mg/dL — ABNORMAL HIGH (ref 0.76–1.27)
Globulin, Total: 2.5 g/dL (ref 1.5–4.5)
Glucose: 89 mg/dL (ref 70–99)
Potassium: 4.4 mmol/L (ref 3.5–5.2)
Sodium: 139 mmol/L (ref 134–144)
Total Protein: 6.8 g/dL (ref 6.0–8.5)
eGFR: 52 mL/min/{1.73_m2} — ABNORMAL LOW (ref >59)

## 2023-07-28 LAB — CBC
Hematocrit: 28.9 % — ABNORMAL LOW (ref 37.5–51.0)
Hemoglobin: 9.3 g/dL — ABNORMAL LOW (ref 13.0–17.7)
MCH: 28.9 pg (ref 26.6–33.0)
MCHC: 32.2 g/dL (ref 31.5–35.7)
MCV: 90 fL (ref 79–97)
Platelets: 212 10*3/uL (ref 150–450)
RBC: 3.22 x10E6/uL — ABNORMAL LOW (ref 4.14–5.80)
RDW: 15.1 % (ref 11.6–15.4)
WBC: 4.5 10*3/uL (ref 3.4–10.8)

## 2023-07-28 LAB — PRO B NATRIURETIC PEPTIDE: NT-Pro BNP: 355 pg/mL (ref 0–486)

## 2023-07-30 NOTE — Addendum Note (Signed)
 Addended byReyne Cave, Geralyn Knee T on: 07/30/2023 05:12 PM   Modules accepted: Orders

## 2023-07-31 ENCOUNTER — Ambulatory Visit (HOSPITAL_COMMUNITY)
Admission: RE | Admit: 2023-07-31 | Discharge: 2023-07-31 | Disposition: A | Discharge status: Home / Self Care | Source: Ambulatory Visit | Attending: Cardiovascular Disease | Admitting: Cardiovascular Disease

## 2023-07-31 DIAGNOSIS — I050 Rheumatic mitral stenosis: Secondary | ICD-10-CM

## 2023-07-31 DIAGNOSIS — I25119 Atherosclerotic heart disease of native coronary artery with unspecified angina pectoris: Secondary | ICD-10-CM | POA: Diagnosis not present

## 2023-07-31 DIAGNOSIS — I5032 Chronic diastolic (congestive) heart failure: Secondary | ICD-10-CM | POA: Diagnosis not present

## 2023-07-31 DIAGNOSIS — R0602 Shortness of breath: Secondary | ICD-10-CM | POA: Diagnosis not present

## 2023-07-31 DIAGNOSIS — I350 Nonrheumatic aortic (valve) stenosis: Secondary | ICD-10-CM

## 2023-07-31 LAB — ECHOCARDIOGRAM COMPLETE
AR max vel: 1.43 cm2
AV Area VTI: 1.49 cm2
AV Area mean vel: 1.39 cm2
AV Mean grad: 8 mmHg
AV Peak grad: 15.5 mmHg
Ao pk vel: 1.97 m/s
Area-P 1/2: 1.83 cm2
Est EF: 55
MV VTI: 1.05 cm2
P 1/2 time: 279 ms
S' Lateral: 3.5 cm

## 2023-08-01 NOTE — Progress Notes (Signed)
 Pt has been made aware of normal result and verbalized understanding.  jw   He has also been made aware of his Echocardiogram results, if you can document that as well.

## 2023-08-03 ENCOUNTER — Telehealth: Payer: Self-pay | Admitting: *Deleted

## 2023-08-03 NOTE — Telephone Encounter (Signed)
Call placed to pt regarding message below.  Left a message for pt to call back.  

## 2023-08-03 NOTE — Telephone Encounter (Signed)
-----   Message from [place] sent at 08/03/2023 12:53 PM EDT ----- When I saw him, the plan was to follow up with me in 6-8 weeks. The appt was never made. He sees Katie in Jan. The tests I did were overall reassuring. His hgb is low but stable. Everything else looked good. If he is doing better and wants to just follow up with Katie in Jan, that is fine. If he is still having symptoms, we should bring him back to see me or Arlester Ladd in the next several weeks. Thanks [employer]

## 2023-08-06 NOTE — Telephone Encounter (Signed)
 Pt did want to come back in for another appt.  Scheduled.  Pt wanted me to let you know he scheduled an appointment to see Dr. Rosalie.

## 2023-08-06 NOTE — Progress Notes (Signed)
 Pt has been made aware of normal result and verbalized understanding.  jw

## 2023-08-09 DIAGNOSIS — K627 Radiation proctitis: Secondary | ICD-10-CM | POA: Diagnosis not present

## 2023-08-09 DIAGNOSIS — D500 Iron deficiency anemia secondary to blood loss (chronic): Secondary | ICD-10-CM | POA: Diagnosis not present

## 2023-08-10 DIAGNOSIS — D500 Iron deficiency anemia secondary to blood loss (chronic): Secondary | ICD-10-CM | POA: Diagnosis not present

## 2023-08-10 DIAGNOSIS — K627 Radiation proctitis: Secondary | ICD-10-CM | POA: Diagnosis not present

## 2023-08-13 ENCOUNTER — Telehealth (HOSPITAL_COMMUNITY): Payer: Self-pay | Admitting: Pharmacy Technician

## 2023-08-13 NOTE — Telephone Encounter (Signed)
 Auth Submission: NO AUTH NEEDED Site of care: Site of care: [facility] Payer: [employer] ADV Medication & CPT/J Code(s) submitted: Feraheme (ferumoxytol) [id number] Diagnosis Code: N18.31 Route of submission (phone, fax, portal):  Phone # Fax # Auth type: Buy/Bill HB Units/visits requested: 510mg  x 2 doses Reference number:  Approval from: 08/13/23 to 02/13/24    Dagoberto Armour, CPhT [facility] [phone]

## 2023-08-15 ENCOUNTER — Other Ambulatory Visit (HOSPITAL_COMMUNITY): Payer: Self-pay | Admitting: *Deleted

## 2023-08-20 ENCOUNTER — Encounter (HOSPITAL_COMMUNITY)
Admission: RE | Admit: 2023-08-20 | Discharge: 2023-08-20 | Disposition: A | Discharge status: Home / Self Care | Source: Ambulatory Visit | Attending: Gastroenterology | Admitting: Gastroenterology

## 2023-08-20 DIAGNOSIS — D631 Anemia in chronic kidney disease: Secondary | ICD-10-CM | POA: Insufficient documentation

## 2023-08-20 DIAGNOSIS — N1831 Chronic kidney disease, stage 3a: Secondary | ICD-10-CM | POA: Insufficient documentation

## 2023-08-20 DIAGNOSIS — D509 Iron deficiency anemia, unspecified: Secondary | ICD-10-CM | POA: Diagnosis not present

## 2023-08-20 MED ORDER — SODIUM CHLORIDE 0.9 % IV SOLN
510.0000 mg | INTRAVENOUS | Status: DC
  Administered 2023-08-20: 510 mg via INTRAVENOUS
  Filled 2023-08-20: qty 510

## 2023-08-27 ENCOUNTER — Ambulatory Visit: Payer: HMO | Admitting: Physician Assistant

## 2023-08-27 ENCOUNTER — Encounter (HOSPITAL_COMMUNITY)

## 2023-09-03 ENCOUNTER — Encounter (HOSPITAL_COMMUNITY)
Admission: RE | Admit: 2023-09-03 | Discharge: 2023-09-03 | Disposition: A | Discharge status: Home / Self Care | Source: Ambulatory Visit | Attending: Gastroenterology | Admitting: Gastroenterology

## 2023-09-03 ENCOUNTER — Telehealth: Payer: Self-pay

## 2023-09-03 DIAGNOSIS — N1831 Chronic kidney disease, stage 3a: Secondary | ICD-10-CM | POA: Diagnosis not present

## 2023-09-03 MED ORDER — SODIUM CHLORIDE 0.9 % IV SOLN
510.0000 mg | INTRAVENOUS | Status: DC
  Administered 2023-09-03: 510 mg via INTRAVENOUS
  Filled 2023-09-03: qty 510

## 2023-09-03 NOTE — Telephone Encounter (Signed)
   Pre-operative Risk Assessment    Patient Name: Logan Wolfe  DOB: 01-01-1946 MRN: [id number]   Date of last office visit: 07/27/23 GLENDIA FERRIER, PA-C Date of next office visit: 09/10/23 GLENDIA FERRIER, PA-C   Request for Surgical Clearance    Procedure:  ENDOSCOPY  Date of Surgery:  Clearance 09/12/23                                Surgeon:  DR ROSALIE Surgeon's Group or Practice Name:  [facility] Phone number:  [phone] Fax number:  [phone]   Type of Clearance Requested:   - Medical  - Pharmacy:  Hold Clopidogrel  (Plavix )     Type of Anesthesia:  PROPOFOL    Additional requests/questions:    SignedLucie DELENA Ku   09/03/2023, 2:19 PM

## 2023-09-03 NOTE — Telephone Encounter (Signed)
 Hi Logan Wolfe is on vacation this week and will not be returning until 7/28 and I cannot speak to the candidacy since I otherwise do not know the patient. Would follow usual preop protocol to otherwise eval. It does look like he has an appointment coming up with Scott on 7/28 but this would require deferring the procedure to allow time for holding Plavix  if needed.

## 2023-09-03 NOTE — Telephone Encounter (Signed)
 Logan Wolfe , patient's chart was reviewed for preoperative cardiac evaluation.  He/she was seen by you on 07/27/23 and according to protocol, we request that you comment on cardiac risk for upcoming procedure since office visit was less than 2 months ago.   He is on Plavix  for history of TAVR, NSTEMI and TIA. In this situation, it seems reasonable to hold Plavix  for endoscopy in the setting of chronic anemia and GIB.   Please route your response to p cv div preop.  Thank you, [name], NP-C 09/03/2023, 4:02 PM

## 2023-09-04 DIAGNOSIS — Z6831 Body mass index (BMI) 31.0-31.9, adult: Secondary | ICD-10-CM | POA: Diagnosis not present

## 2023-09-04 DIAGNOSIS — K5900 Constipation, unspecified: Secondary | ICD-10-CM | POA: Diagnosis not present

## 2023-09-04 DIAGNOSIS — Z09 Encounter for follow-up examination after completed treatment for conditions other than malignant neoplasm: Secondary | ICD-10-CM | POA: Diagnosis not present

## 2023-09-04 DIAGNOSIS — F4321 Adjustment disorder with depressed mood: Secondary | ICD-10-CM | POA: Diagnosis not present

## 2023-09-04 NOTE — Telephone Encounter (Signed)
   Primary Cardiologist: Ozell Fell, MD  Chart reviewed as part of pre-operative protocol coverage. I called and spoke with patient to evaluate his current state and he reports his primary concern is diarrhea. He is able to achieve > 4 METS activity without concerning cardiac symptoms.   Given past medical history and time since last visit, based on ACC/AHA guidelines, Logan Wolfe would be at acceptable risk for the planned procedure without further cardiovascular testing.   Patient should contact our office if he is having new symptoms that are concerning from a cardiac perspective to arrange a follow-up appointment.    He is on Plavix  for history of TAVR, NSTEMI and TIA. In this situation, it seems reasonable to hold Plavix  for endoscopy in the setting of chronic anemia and GIB.   I will route this recommendation to the requesting party via Epic fax function and remove from pre-op pool.  Please call with questions.  [name], NP-C  09/04/2023, 10:42 AM [address] [place], [place] [address] Office [phone] Fax [phone]

## 2023-09-06 ENCOUNTER — Encounter: Payer: Self-pay | Admitting: *Deleted

## 2023-09-09 NOTE — Progress Notes (Signed)
 "     OFFICE NOTE:    Date:  09/10/2023  ID:  [name], DOB 11/25/1945, MRN [id number] PCP: Okey Carlin Redbird, MD  [facility] Providers Cardiologist:  Ozell Fell, MD       Coronary artery disease NSTEMI in setting of GI bleed 12/2022 -med Rx LHC 01/09/2023: LAD mid 40 RCA proximal 80, small PDA 99 (culprit) Aortic stenosis s/p TAVI 03/2023 TTE 04/13/23: EF 55-60, no RWMA, Gr 1 DD, NL RVSF, NL PASP, RVSP 29.4, mod LAE, mod MS, mean MV 7 mmHg, TAVI w mild PVL, mean 8.2 mmHg TTE 07/31/2023: EF 55, no RWMA, mild concentric LVH, normal RVSF, normal PASP, trivial MR, mild-moderate MS (mean gradient 4 mmHg), s/p TAVR with mild paravalvular leak, normal structure and function  (HFpEF) heart failure with preserved ejection fraction  Supraventricular Tachycardia Mitral stenosis Mod MS by TTE in 03/2023  Left Bundle Branch Block  Post TAVI - monitor 03/2023 w/o high grade HB, no AF Pulmonary hypertension Metastatic prostate CA s/p resection/XRT/hormonal Rx Chronic kidney disease Hx of TIA 07/2023 MRI: no acute CVA; small vessel ischemic ?, R cerebellar chronic CVA, tiny inf L cerebellar CVA Anemia Hx of GI bleed Colo with multiple bleeding colonic angioectasias, Tx w APC GERD PCN allergy       Discussed the use of AI scribe software for clinical note transcription with the patient, who gave verbal consent to proceed. History of Present Illness Logan Wolfe is a 78 y.o. male who returns for follow up of shortness of breath, CAD, CHF, valvular heart disease. He was last seen in June 2025. He noted symptoms of dyspnea on exertion, dizziness, indigestion and nausea and vomiting. I started him on PPI (Protonix ) and nitrates (Isosorbide ). I asked him to follow up earlier with his GI. He has chronic anemia and chronic kidney disease. Hgb and Creatinine were stable. BNP, TSH was normal.  Repeat echocardiogram showed normal EF, normal TAVR function with mild PVL. Of note, he had IV Fe Rx  earlier this month. He has an EGD scheduled with Dr. Rosalie 09/12/23.   He is here alone. His hemoglobin levels remain low but stable, ranging between 8.8 and 9.3. He has been receiving iron  infusions, having completed two so far. He feels a little better recently, possibly due to the iron  infusions. Occasional minor discomfort at night when lying in bed, but no major discomfort during the day. He recounts a recent fall where he tripped and landed on his face, resulting in a bruise but no significant pain or headaches. He is currently off clopidogrel  in preparation for an upcoming endoscopy and is taking baby aspirin  instead.  ROS-See HPI    Studies Reviewed:              Physical Exam:  VS:  BP 116/68   Pulse 70   Ht 6' 1 (1.854 m)   Wt 234 lb 3.2 oz (106.2 kg)   SpO2 96%   BMI 30.90 kg/m        Wt Readings from Last 3 Encounters:  09/10/23 234 lb 3.2 oz (106.2 kg)  07/27/23 235 lb (106.6 kg)  07/17/23 238 lb 8.6 oz (108.2 kg)    Constitutional:      Appearance: Healthy appearance. Not in distress.  Neck:     Vascular: JVD normal.  Pulmonary:     Breath sounds: Normal breath sounds. No wheezing. No rales.  Cardiovascular:     Normal rate. Regular rhythm.  Murmurs: There is a grade 2/6 systolic murmur at the LLSB.  Edema:    Peripheral edema absent.  Abdominal:     Palpations: Abdomen is soft.      Assessment and Plan:    Assessment & Plan Severe aortic stenosis S/P TAVR (transcatheter aortic valve replacement) S/p TAVR 03/2023. Recent echocardiogram showed normal valve function with a mild paravalvular leak. Symptoms are stable with no significant changes. - Continue follow-up with structural heart team as planned in Jan. - Continue SBE prophylaxis. Coronary artery disease involving native coronary artery of native heart with angina pectoris (HCC) Hx of NSTEMI in the setting of GI bleed in 12/2022. He had 80% pRCA and 99% stenosis in a small PDA. Med Rx was  recommended at that time. He has improved with the addition of long acting nitrates and Iron  infusions. No further chest pain. Shortness of breath has improved. Overall, symptoms are stable. No further testing needed at this time.  - Continue Atorvastatin  80 mg daily. - Continue Metoprolol  succinate 50 mg once daily  - Continue Plavix  75 mg daily (currently on hold for EGD; pt switched from ASA to Plavix  due to TIA). - Continue Imdur  15 mg daily. Chronic heart failure with preserved ejection fraction (HCC) Recent TTE with EF 55. Volume status stable. NYHA II-IIb. BNP was normal at last visit. Continue current management.  Anemia, unspecified type Anemia with hemoglobin levels between 8.8 and 9.3. Recent iron  infusions administered. Question if anemia due to chronic blood loss. EGD pending later this week per GI. He can proceed at acceptable risk. He is currently on ASA with plans to transition back to Clopidogrel  after his EGD. Mitral valve stenosis, unspecified etiology Mild to mod on recent echocardiogram. He has another echocardiogram pending in Jan for follow up 1 year post TAVR. Shortness of breath Likely multifactorial and related to CAD, anemia, deconditioning. Recent echocardiogram with normal EF, normally functioning TAVR and mild PVL, mild to mod MS. BNP was normal. Chest pain resolved with Imdur  and iron  infusions and shortness of breath is overall improved. No further workup.       Dispo:  Return in about 6 months (around 03/12/2024) for Scheduled Follow Up.  Signed, Glendia Ferrier, PA-C   "

## 2023-09-10 ENCOUNTER — Encounter: Payer: Self-pay | Admitting: Physician Assistant

## 2023-09-10 ENCOUNTER — Ambulatory Visit: Attending: Physician Assistant | Admitting: Physician Assistant

## 2023-09-10 VITALS — BP 116/68 | HR 70 | Ht 73.0 in | Wt 234.2 lb

## 2023-09-10 DIAGNOSIS — I050 Rheumatic mitral stenosis: Secondary | ICD-10-CM

## 2023-09-10 DIAGNOSIS — D649 Anemia, unspecified: Secondary | ICD-10-CM

## 2023-09-10 DIAGNOSIS — I5032 Chronic diastolic (congestive) heart failure: Secondary | ICD-10-CM | POA: Diagnosis not present

## 2023-09-10 DIAGNOSIS — I25119 Atherosclerotic heart disease of native coronary artery with unspecified angina pectoris: Secondary | ICD-10-CM

## 2023-09-10 DIAGNOSIS — I350 Nonrheumatic aortic (valve) stenosis: Secondary | ICD-10-CM | POA: Diagnosis not present

## 2023-09-10 DIAGNOSIS — R0602 Shortness of breath: Secondary | ICD-10-CM

## 2023-09-10 DIAGNOSIS — Z952 Presence of prosthetic heart valve: Secondary | ICD-10-CM

## 2023-09-10 DIAGNOSIS — N1831 Chronic kidney disease, stage 3a: Secondary | ICD-10-CM

## 2023-09-10 NOTE — Assessment & Plan Note (Signed)
 S/p TAVR 03/2023. Recent echocardiogram showed normal valve function with a mild paravalvular leak. Symptoms are stable with no significant changes. - Continue follow-up with structural heart team as planned in Jan. - Continue SBE prophylaxis.

## 2023-09-10 NOTE — Patient Instructions (Signed)
 Medication Instructions:  Your physician recommends that you continue on your current medications as directed. Please refer to the Current Medication list given to you today.  *If you need a refill on your cardiac medications before your next appointment, please call your pharmacy*  Lab Work: None ordered  If you have labs (blood work) drawn today and your tests are completely normal, you will receive your results only by: MyChart Message (if you have MyChart) OR A paper copy in the mail If you have any lab test that is abnormal or we need to change your treatment, we will call you to review the results.  Testing/Procedures: None ordered  Follow-Up: At [facility], you and your health needs are our priority.  As part of our continuing mission to provide you with exceptional heart care, our providers are all part of one team.  This team includes your primary Cardiologist (physician) and Advanced Practice Providers or APPs (Physician Assistants and Nurse Practitioners) who all work together to provide you with the care you need, when you need it.  Your next appointment:   1 year(s)  Provider:   Ozell Fell, MD    We recommend signing up for the patient portal called MyChart.  Sign up information is provided on this After Visit Summary.  MyChart is used to connect with patients for Virtual Visits (Telemedicine).  Patients are able to view lab/test results, encounter notes, upcoming appointments, etc.  Non-urgent messages can be sent to your provider as well.   To learn more about what you can do with MyChart, go to [email or web address].   Other Instructions

## 2023-09-10 NOTE — Assessment & Plan Note (Signed)
 Hx of NSTEMI in the setting of GI bleed in 12/2022. He had 80% pRCA and 99% stenosis in a small PDA. Med Rx was recommended at that time. He has improved with the addition of long acting nitrates and Iron  infusions. No further chest pain. Shortness of breath has improved. Overall, symptoms are stable. No further testing needed at this time.  - Continue Atorvastatin  80 mg daily. - Continue Metoprolol  succinate 50 mg once daily  - Continue Plavix  75 mg daily (currently on hold for EGD; pt switched from ASA to Plavix  due to TIA). - Continue Imdur  15 mg daily.

## 2023-09-12 DIAGNOSIS — D649 Anemia, unspecified: Secondary | ICD-10-CM | POA: Diagnosis not present

## 2023-09-12 DIAGNOSIS — K31819 Angiodysplasia of stomach and duodenum without bleeding: Secondary | ICD-10-CM | POA: Diagnosis not present

## 2023-09-12 DIAGNOSIS — D509 Iron deficiency anemia, unspecified: Secondary | ICD-10-CM | POA: Diagnosis not present

## 2023-09-12 DIAGNOSIS — K2950 Unspecified chronic gastritis without bleeding: Secondary | ICD-10-CM | POA: Diagnosis not present

## 2023-09-12 DIAGNOSIS — K449 Diaphragmatic hernia without obstruction or gangrene: Secondary | ICD-10-CM | POA: Diagnosis not present

## 2023-09-12 DIAGNOSIS — D500 Iron deficiency anemia secondary to blood loss (chronic): Secondary | ICD-10-CM | POA: Diagnosis not present

## 2023-09-27 ENCOUNTER — Ambulatory Visit
Admission: RE | Admit: 2023-09-27 | Discharge: 2023-09-27 | Disposition: A | Discharge status: Home / Self Care | Source: Ambulatory Visit | Attending: Family Medicine | Admitting: Family Medicine

## 2023-09-27 ENCOUNTER — Other Ambulatory Visit: Payer: Self-pay | Admitting: Family Medicine

## 2023-09-27 DIAGNOSIS — M545 Low back pain, unspecified: Secondary | ICD-10-CM | POA: Diagnosis not present

## 2023-09-27 DIAGNOSIS — M549 Dorsalgia, unspecified: Secondary | ICD-10-CM

## 2023-09-27 DIAGNOSIS — M546 Pain in thoracic spine: Secondary | ICD-10-CM | POA: Diagnosis not present

## 2023-10-08 DIAGNOSIS — D500 Iron deficiency anemia secondary to blood loss (chronic): Secondary | ICD-10-CM | POA: Diagnosis not present

## 2023-10-31 DIAGNOSIS — M9902 Segmental and somatic dysfunction of thoracic region: Secondary | ICD-10-CM | POA: Diagnosis not present

## 2023-10-31 DIAGNOSIS — M5414 Radiculopathy, thoracic region: Secondary | ICD-10-CM | POA: Diagnosis not present

## 2023-10-31 DIAGNOSIS — H25813 Combined forms of age-related cataract, bilateral: Secondary | ICD-10-CM | POA: Diagnosis not present

## 2023-10-31 DIAGNOSIS — H52223 Regular astigmatism, bilateral: Secondary | ICD-10-CM | POA: Diagnosis not present

## 2023-10-31 DIAGNOSIS — M5386 Other specified dorsopathies, lumbar region: Secondary | ICD-10-CM | POA: Diagnosis not present

## 2023-10-31 DIAGNOSIS — C61 Malignant neoplasm of prostate: Secondary | ICD-10-CM | POA: Diagnosis not present

## 2023-10-31 DIAGNOSIS — H5203 Hypermetropia, bilateral: Secondary | ICD-10-CM | POA: Diagnosis not present

## 2023-10-31 DIAGNOSIS — H524 Presbyopia: Secondary | ICD-10-CM | POA: Diagnosis not present

## 2023-10-31 DIAGNOSIS — H40013 Open angle with borderline findings, low risk, bilateral: Secondary | ICD-10-CM | POA: Diagnosis not present

## 2023-10-31 DIAGNOSIS — H35033 Hypertensive retinopathy, bilateral: Secondary | ICD-10-CM | POA: Diagnosis not present

## 2023-10-31 DIAGNOSIS — M9904 Segmental and somatic dysfunction of sacral region: Secondary | ICD-10-CM | POA: Diagnosis not present

## 2023-10-31 DIAGNOSIS — M9903 Segmental and somatic dysfunction of lumbar region: Secondary | ICD-10-CM | POA: Diagnosis not present

## 2023-10-31 DIAGNOSIS — M5417 Radiculopathy, lumbosacral region: Secondary | ICD-10-CM | POA: Diagnosis not present

## 2023-11-02 DIAGNOSIS — M5414 Radiculopathy, thoracic region: Secondary | ICD-10-CM | POA: Diagnosis not present

## 2023-11-02 DIAGNOSIS — M5417 Radiculopathy, lumbosacral region: Secondary | ICD-10-CM | POA: Diagnosis not present

## 2023-11-02 DIAGNOSIS — M5386 Other specified dorsopathies, lumbar region: Secondary | ICD-10-CM | POA: Diagnosis not present

## 2023-11-02 DIAGNOSIS — M9902 Segmental and somatic dysfunction of thoracic region: Secondary | ICD-10-CM | POA: Diagnosis not present

## 2023-11-02 DIAGNOSIS — M9904 Segmental and somatic dysfunction of sacral region: Secondary | ICD-10-CM | POA: Diagnosis not present

## 2023-11-02 DIAGNOSIS — M9903 Segmental and somatic dysfunction of lumbar region: Secondary | ICD-10-CM | POA: Diagnosis not present

## 2023-11-06 DIAGNOSIS — M9904 Segmental and somatic dysfunction of sacral region: Secondary | ICD-10-CM | POA: Diagnosis not present

## 2023-11-06 DIAGNOSIS — M5417 Radiculopathy, lumbosacral region: Secondary | ICD-10-CM | POA: Diagnosis not present

## 2023-11-06 DIAGNOSIS — M9903 Segmental and somatic dysfunction of lumbar region: Secondary | ICD-10-CM | POA: Diagnosis not present

## 2023-11-06 DIAGNOSIS — M5386 Other specified dorsopathies, lumbar region: Secondary | ICD-10-CM | POA: Diagnosis not present

## 2023-11-06 DIAGNOSIS — M9902 Segmental and somatic dysfunction of thoracic region: Secondary | ICD-10-CM | POA: Diagnosis not present

## 2023-11-06 DIAGNOSIS — M5414 Radiculopathy, thoracic region: Secondary | ICD-10-CM | POA: Diagnosis not present

## 2023-11-07 DIAGNOSIS — N5201 Erectile dysfunction due to arterial insufficiency: Secondary | ICD-10-CM | POA: Diagnosis not present

## 2023-11-07 DIAGNOSIS — C775 Secondary and unspecified malignant neoplasm of intrapelvic lymph nodes: Secondary | ICD-10-CM | POA: Diagnosis not present

## 2023-11-07 DIAGNOSIS — C61 Malignant neoplasm of prostate: Secondary | ICD-10-CM | POA: Diagnosis not present

## 2023-11-12 DIAGNOSIS — Z6831 Body mass index (BMI) 31.0-31.9, adult: Secondary | ICD-10-CM | POA: Diagnosis not present

## 2023-11-12 DIAGNOSIS — Z23 Encounter for immunization: Secondary | ICD-10-CM | POA: Diagnosis not present

## 2023-11-12 DIAGNOSIS — Z8673 Personal history of transient ischemic attack (TIA), and cerebral infarction without residual deficits: Secondary | ICD-10-CM | POA: Diagnosis not present

## 2023-11-12 DIAGNOSIS — R519 Headache, unspecified: Secondary | ICD-10-CM | POA: Diagnosis not present

## 2023-11-13 DIAGNOSIS — M5417 Radiculopathy, lumbosacral region: Secondary | ICD-10-CM | POA: Diagnosis not present

## 2023-11-13 DIAGNOSIS — M5386 Other specified dorsopathies, lumbar region: Secondary | ICD-10-CM | POA: Diagnosis not present

## 2023-11-13 DIAGNOSIS — M9903 Segmental and somatic dysfunction of lumbar region: Secondary | ICD-10-CM | POA: Diagnosis not present

## 2023-11-13 DIAGNOSIS — M5414 Radiculopathy, thoracic region: Secondary | ICD-10-CM | POA: Diagnosis not present

## 2023-11-13 DIAGNOSIS — M9902 Segmental and somatic dysfunction of thoracic region: Secondary | ICD-10-CM | POA: Diagnosis not present

## 2023-11-13 DIAGNOSIS — M9904 Segmental and somatic dysfunction of sacral region: Secondary | ICD-10-CM | POA: Diagnosis not present

## 2023-11-16 DIAGNOSIS — M5417 Radiculopathy, lumbosacral region: Secondary | ICD-10-CM | POA: Diagnosis not present

## 2023-11-16 DIAGNOSIS — M5414 Radiculopathy, thoracic region: Secondary | ICD-10-CM | POA: Diagnosis not present

## 2023-11-16 DIAGNOSIS — M9903 Segmental and somatic dysfunction of lumbar region: Secondary | ICD-10-CM | POA: Diagnosis not present

## 2023-11-16 DIAGNOSIS — M9904 Segmental and somatic dysfunction of sacral region: Secondary | ICD-10-CM | POA: Diagnosis not present

## 2023-11-16 DIAGNOSIS — M9902 Segmental and somatic dysfunction of thoracic region: Secondary | ICD-10-CM | POA: Diagnosis not present

## 2023-11-16 DIAGNOSIS — M5386 Other specified dorsopathies, lumbar region: Secondary | ICD-10-CM | POA: Diagnosis not present

## 2023-11-20 DIAGNOSIS — M5414 Radiculopathy, thoracic region: Secondary | ICD-10-CM | POA: Diagnosis not present

## 2023-11-20 DIAGNOSIS — M9903 Segmental and somatic dysfunction of lumbar region: Secondary | ICD-10-CM | POA: Diagnosis not present

## 2023-11-20 DIAGNOSIS — M5417 Radiculopathy, lumbosacral region: Secondary | ICD-10-CM | POA: Diagnosis not present

## 2023-11-20 DIAGNOSIS — M9904 Segmental and somatic dysfunction of sacral region: Secondary | ICD-10-CM | POA: Diagnosis not present

## 2023-11-20 DIAGNOSIS — M9902 Segmental and somatic dysfunction of thoracic region: Secondary | ICD-10-CM | POA: Diagnosis not present

## 2023-11-20 DIAGNOSIS — M5386 Other specified dorsopathies, lumbar region: Secondary | ICD-10-CM | POA: Diagnosis not present

## 2023-11-23 ENCOUNTER — Other Ambulatory Visit: Payer: Self-pay | Admitting: Physician Assistant

## 2023-12-05 DIAGNOSIS — H25813 Combined forms of age-related cataract, bilateral: Secondary | ICD-10-CM | POA: Diagnosis not present

## 2023-12-05 DIAGNOSIS — H52223 Regular astigmatism, bilateral: Secondary | ICD-10-CM | POA: Diagnosis not present

## 2023-12-05 DIAGNOSIS — H40013 Open angle with borderline findings, low risk, bilateral: Secondary | ICD-10-CM | POA: Diagnosis not present

## 2023-12-05 DIAGNOSIS — H35033 Hypertensive retinopathy, bilateral: Secondary | ICD-10-CM | POA: Diagnosis not present

## 2023-12-05 DIAGNOSIS — H5203 Hypermetropia, bilateral: Secondary | ICD-10-CM | POA: Diagnosis not present

## 2023-12-05 DIAGNOSIS — H0102A Squamous blepharitis right eye, upper and lower eyelids: Secondary | ICD-10-CM | POA: Diagnosis not present

## 2023-12-05 DIAGNOSIS — H524 Presbyopia: Secondary | ICD-10-CM | POA: Diagnosis not present

## 2024-02-28 ENCOUNTER — Ambulatory Visit: Admitting: Physician Assistant

## 2024-02-28 ENCOUNTER — Ambulatory Visit (HOSPITAL_COMMUNITY): Admission: RE | Admit: 2024-02-28

## 2024-02-28 NOTE — Progress Notes (Unsigned)
 " [facility]   MULTIDISCIPLINARY HEART VALVE CLINIC                                     Cardiology Office Note:    Date:  02/28/2024   ID:  Logan Wolfe, DOB 09-Mar-1945, MRN [id number]  PCP:  Okey Carlin Redbird, MD  [facility] Cardiologist:  Ozell Fell, MD  [facility] Structural heart: Ozell Fell, MD [facility] Electrophysiologist:  None   Referring MD: Okey Carlin Redbird, MD   1 year s/p TAVR  History of Present Illness:    Logan Wolfe is a 79 y.o. male with a hx of metastatic prostate cancer s/p resection/XRT/hormonal therapy, pulmonary hypertension, CAD, SVT, CKD stage IIIa, HLD, GERD, anemia with recurrent GI bleeding and severe aortic stenosis s/p TAVR (03/20/23) who presents to clinic for follow up.   He was admitted 11/25-11/27/24 for NSTEMI in the setting of acute GI bleed. HStop peaked at 1921. Cath 11/26 showed an 80% RCA focal stenosis proximal to bifurcation, and subtotally occluded small PDA branch likely the culprit of NSTEMI. Medical management was recommended. He required transfusion for acute anemia (hg down to 7.8) related to GI bleeding. Colonoscopy 01/10/23 with external/internal hemorrhoids, multiple bleeding colonic angioectasias treated with APC. 2D echo showed EF 60-65% with severe paradoxical LFLG AS with a mean gradient of 29 mm hg, AVA 0.95 cm2, mild to mod AI, mod MAC with mild MR, and moderate pulm htn. Aortic valve interrogation on cath showed a mean gradient of 47 mm hg. S/p TAVR with a 26 mm Edwards Sapien 3 Ultra Resilia THV via the TF approach on 03/20/23. Hg dropped to 6.8 and he was treated with 1U PRBCs. He was treated with one dose of IV Lasix  for an elevated LVEDP ~28 mmhg. He was discharged home with a Zio AT given new LBBB after TAVR which was normal. He had been followed closely by GI for chronic GI bleeding and anemia.   Today the patient presents to clinic for follow up.   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 Fell & Dr. Maryjane   Severe aortic stenosis    Spinal stenosis    Unspecified pruritic disorder      Current Medications: Active Medications[1]    ROS:   Please see the history of present illness.    All other systems reviewed and are negative.  EKGs       Risk Assessment/Calculations:       {This patient may be at risk for Amyloid. He has one or more dx on the prob list or PMH from the following list -  Abnormal EKG, HFpEF/Diastolic CHF, Aortic Stenosis, LVH, Bilateral Carpal Tunnel Syndrome, Biceps Tendon Rupture, Spinal Stenosis, Pericardial Effusion, Left Atrial Enlargement, Conduction System Disorder. See list below or review PMH.  Diagnoses From Problem List  Noted     Acute on chronic diastolic heart failure (HCC) 03/21/2023     Severe aortic stenosis 03/05/2022     Spinal stenosis 03/22/2020    Click HERE to open Cardiac Amyloid Screening SmartSet to order screening OR Click HERE to defer testing for 1 year or permanently :1}    Physical Exam:    VS:  There were no vitals taken for this visit.    Wt Readings from Last 3 Encounters:  09/10/23 234 lb 3.2 oz (106.2 kg)  07/27/23 235 lb (106.6 kg)  07/17/23 238 lb 8.6 oz (108.2 kg)     GEN: Well nourished, well developed in no acute distress NECK: No JVD CARDIAC: ***RRR, no murmurs, rubs, gallops RESPIRATORY:  Clear to auscultation without rales, wheezing or rhonchi  ABDOMEN: Soft, non-tender, non-distended EXTREMITIES:  No edema; No deformity.   ASSESSMENT:    1. S/P TAVR (transcatheter aortic valve replacement)    2. LBBB (left bundle branch block)   3. Chronic heart failure with preserved ejection fraction (HFpEF) (HCC)   4. Coronary artery disease involving native coronary artery of native heart with angina pectoris   5. Anemia, unspecified type   6. CKD stage 3a, GFR 45-59 ml/min (HCC)   7. 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HCC)   8. Mitral valve stenosis, unspecified etiology     PLAN:    In order of problems listed above:  Severe AS s/p TAVR:  -- Echo today shows EF ***, normally functioning TAVR with a mean gradient of *** mm hg and *** PVL.  -- NYHA class *** symptoms.  -- Continue Aspirin  81mg  daily.  -- SBE prophylaxis as indicated.  -- Continue regular follow up with Dr. Wonda and Glendia Ferrier PA-C.   New LBBB:  -- New after TAVR. -- No HAVB on Zio   HFpEF:  -- Appears euvolemic today -- Not on any standing diuretics   CAD:  -- NSTEMI in the setting of GI bleeding in 12/2022. -- Cath 01/09/23 showed an 80% focal RCA stenosis proximal to bifurcation, and subtotally occluded small PDA branch likely the culprit of NSTEMI.  -- He has improved with the addition of long acting nitrates and Iron  infusions. No further chest pain.  -- Continue ASA 81mg  daily, Toprol  XL 50mg  daily and Atorvastatin  80 mg daily.    Anemia with previous GI bleeding:  -- Colonoscopy 01/10/23 with external/internal hemorrhoids, multiple bleeding colonic angioectasias treated with APC.  -- Followed by Dr. Fayne with GI. -- Labs 07/27/23 showed Hg improved to 9.3 (personally reviewed)   CKD stage IIIa:  -- Creat baseline 1.28-1.5 (personally reviewed).  -- Labs 07/27/23 showed Hg improved to 1.37 (personally reviewed)   Hx of metastatic prostate CA:  -- S/p resection/radiation and on hormonal therapy. -- Follows with [facility]   Moderate MS:  -- Noted on echo today with a mean gradient of .  -- Will follow over time.   Medication Adjustments/Labs and Tests Ordered: Current medicines  are reviewed at length with the patient today.  Concerns regarding medicines are outlined above.  No orders of the defined types were placed in this encounter.  No orders of the defined types were placed in this encounter.   There are no Patient Instructions on file for this visit.   Signed, Lamarr Hummer, PA-C  02/28/2024 12:51 PM    [facility]    [1]  No outpatient medications have been marked as taking for the 02/28/24 encounter (Appointment) with  Logan Lamarr SAUNDERS, PA-C.   "

## 2024-03-05 ENCOUNTER — Ambulatory Visit (HOSPITAL_COMMUNITY)
Admission: RE | Admit: 2024-03-05 | Discharge: 2024-03-05 | Disposition: A | Discharge status: Home / Self Care | Source: Ambulatory Visit | Attending: Cardiology | Admitting: Cardiology

## 2024-03-05 DIAGNOSIS — I350 Nonrheumatic aortic (valve) stenosis: Secondary | ICD-10-CM | POA: Diagnosis not present

## 2024-03-05 DIAGNOSIS — Z952 Presence of prosthetic heart valve: Secondary | ICD-10-CM

## 2024-03-05 DIAGNOSIS — I5032 Chronic diastolic (congestive) heart failure: Secondary | ICD-10-CM | POA: Insufficient documentation

## 2024-03-05 DIAGNOSIS — Z8673 Personal history of transient ischemic attack (TIA), and cerebral infarction without residual deficits: Secondary | ICD-10-CM | POA: Diagnosis present

## 2024-03-05 DIAGNOSIS — R0602 Shortness of breath: Secondary | ICD-10-CM | POA: Insufficient documentation

## 2024-03-05 DIAGNOSIS — D649 Anemia, unspecified: Secondary | ICD-10-CM | POA: Diagnosis present

## 2024-03-05 DIAGNOSIS — I25119 Atherosclerotic heart disease of native coronary artery with unspecified angina pectoris: Secondary | ICD-10-CM | POA: Insufficient documentation

## 2024-03-05 LAB — ECHOCARDIOGRAM COMPLETE
AV Mean grad: 7 mmHg
AV Peak grad: 11.2 mmHg
Ao pk vel: 1.67 m/s
Area-P 1/2: 2.83 cm2
Est EF: 55
P 1/2 time: 610 ms
S' Lateral: 2.9 cm

## 2024-03-13 ENCOUNTER — Encounter: Payer: Self-pay | Admitting: Cardiovascular Disease

## 2024-03-13 ENCOUNTER — Ambulatory Visit: Admitting: Cardiovascular Disease

## 2024-03-13 VITALS — BP 130/70 | HR 71 | Ht 73.0 in | Wt 238.6 lb

## 2024-03-13 DIAGNOSIS — I342 Nonrheumatic mitral (valve) stenosis: Secondary | ICD-10-CM | POA: Diagnosis not present

## 2024-03-13 DIAGNOSIS — I25119 Atherosclerotic heart disease of native coronary artery with unspecified angina pectoris: Secondary | ICD-10-CM | POA: Diagnosis not present

## 2024-03-13 DIAGNOSIS — Z952 Presence of prosthetic heart valve: Secondary | ICD-10-CM | POA: Diagnosis not present

## 2024-03-13 DIAGNOSIS — I5032 Chronic diastolic (congestive) heart failure: Secondary | ICD-10-CM

## 2024-03-13 NOTE — Assessment & Plan Note (Addendum)
 Recent echo reviewed.  Mean gradient 7 mmHg, mild to moderate paravalvular regurgitation noted.  Continue current management.  Follow-up 1 year.  Discussed with the patient that I think he needs to have a surveillance echocardiogram on a yearly basis with his mild to moderate paravalvular regurgitation.  He appears asymptomatic and has no evidence of hemolysis.  Should be able to follow him with surveillance and I am hopeful that he will not require any intervention.

## 2024-03-13 NOTE — Progress Notes (Signed)
 " Cardiology Office Note:    Date:  03/13/2024   ID:  Logan Wolfe, DOB Jun 23, 1945, MRN [id number]  PCP:  Okey Carlin Redbird, MD   [facility] Providers Cardiologist:  Ozell Fell, MD Structural Heart:  Ozell Fell, MD    Referring MD: Okey Carlin Redbird, MD   Chief Complaint  Patient presents with   Follow-up    S/P TAVR    History of Present Illness:    Logan Wolfe is a 79 y.o. 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 TTE 04/13/23: EF 55-60, no RWMA, Gr 1 DD, NL RVSF, NL PASP, RVSP 29.4, mod LAE, mod MS, mean MV 7 mmHg, TAVI w mild PVL, mean 8.2 mmHg TTE 07/31/2023: EF 55, no RWMA, mild concentric LVH, normal RVSF, normal PASP, trivial MR, mild-moderate MS (mean gradient 4 mmHg), s/p TAVR with mild paravalvular leak, normal structure and function  (HFpEF) heart failure with preserved ejection fraction  Supraventricular Tachycardia Mitral stenosis Mod MS by TTE in 03/2023  Left Bundle Branch Block  Post TAVI - monitor 03/2023 w/o high grade HB, no AF Pulmonary hypertension Metastatic prostate CA s/p resection/XRT/hormonal Rx Chronic kidney disease Hx of TIA 07/2023 MRI: no acute CVA; small vessel ischemic ?, R cerebellar chronic CVA, tiny inf L cerebellar CVA Anemia Hx of GI bleed Colo with multiple bleeding colonic angioectasias, Tx w APC GERD PCN allergy       He is here alone today. His wife passed away almost a year ago and he is adjusting to living alone, feeling lonely at times. He has occasions where he feels a 'catch' in his breath. He denies chest pain, chest pressure, or discomfort in the chest.  Overall the patient feels like things are stable from a cardiac perspective.  He has no lightheadedness, presyncope, orthopnea, or PND.  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 Fell & Dr. Maryjane   Severe aortic stenosis    Spinal stenosis    Unspecified pruritic disorder     Current Medications: Active Medications[1]   Allergies:   Doxycycline, Neosporin [bacitracin-polymyxin b], Penicillin g, and Penicillins   ROS:   Please see the history of present illness.    All other systems reviewed and are negative.  EKGs/Labs/Other Studies Reviewed:    The following studies were reviewed today: Cardiac Studies & Procedures   ______________________________________________________________________________________________ CARDIAC CATHETERIZATION  CARDIAC CATHETERIZATION 01/09/2023  Conclusion Images from the original result were not included. Left Heart Catheterization 01/09/23: Hemodynamic data: LV 159/-8, EDP 14 mmHg.  Able 114/48, mean 76 mmHg.  Peak to peak gradient of 42.9 and mean gradient of 47.29 mmHg consistent with severe aortic stenosis.  Angiographic data: LM: Large-caliber vessel.  It is smooth and normal. LAD: Gives origin to moderate sized D1 and D2, after the origin of D1 there is  a napkin ring like 40% stenosis.  Otherwise LAD has minimal disease. LCx: Large-caliber vessel, gives origin to 3 marginals, minimal disease is evident.  OM 1 and 2 are large. RCA: Dominant and large.  There is a distal 80% focal stenosis proximal to bifurcation.  PDA has 2 branches, 1 moderate-sized and second small branch which is subtotally occluded and is culprit for NSTEMI.  PL branch is moderate-sized.    Impression and recommendations: NSTEMI related to occlusion of a small secondary PDA and there is  a focal 80% stenosis in the distal right coronary artery which can be managed medically for now.  Patient stable to proceed with further GI evaluation. Severe aortic valve stenosis as noted above.  Findings Coronary Findings Diagnostic  Dominance: Right  Left Anterior Descending Prox LAD to Mid LAD lesion is 40% stenosed.  Right Coronary Artery Dist RCA lesion is 80% stenosed.  First Right Posterolateral Branch 1st RPL lesion is 99% stenosed.  Intervention  No interventions have been documented.   STRESS TESTS  MYOCARDIAL PERFUSION IMAGING 05/18/2017  Interpretation Summary  Nuclear stress EF: 64%.  There was no ST segment deviation noted during stress.  Defect 1: There is a medium defect of moderate severity present in the basal inferior, mid inferior, apical inferior and apical lateral location.  This is a low risk study.  The left ventricular ejection fraction is normal (55-65%).  Given the normal systolic function, findings are most consistent with diaphragmatic attenuation. Cannot rule out prior inferior infarct.   ECHOCARDIOGRAM  ECHOCARDIOGRAM COMPLETE 03/05/2024  Narrative ECHOCARDIOGRAM REPORT    Patient Name:   Logan Wolfe Date of Exam: 03/05/2024 Medical Rec #:  [id number]      Height:       73.0 in Accession #:    [id number]     Weight:       234.2 lb Date of Birth:  [birth year]      BSA:          2.301 m Patient Age:    78 years       BP:           116/68 mmHg Patient Gender: M              HR:           75 bpm. Exam Location:  [address]  Procedure: 2D Echo, Cardiac Doppler and Color Doppler (Both Spectral and Color Flow Doppler were utilized during procedure).  Indications:    I35.0 Aortic Stenosis Z95.2 TAVR  History:        Patient has prior history of Echocardiogram examinations, most recent 07/31/2023. TAVR-26 mm S3UR.  Sonographer:    Carl Coma RDCS Referring Phys: [id number] GLENDIA DASEN WEAVER  IMPRESSIONS   1. Left  ventricular ejection fraction, by estimation, is 55%. The left ventricle has normal function. The left ventricle has no regional wall motion abnormalities but there is septal-lateral dyssynchrony consistent with LBBB. Left ventricular diastolic parameters are consistent with Grade I diastolic dysfunction (impaired relaxation). 2. Right ventricular systolic function is normal. The right ventricular size is mildly enlarged. There is mildly elevated pulmonary artery systolic pressure. The estimated right ventricular systolic pressure is 40.7 mmHg. 3. Left atrial size was mildly dilated. 4. The mitral valve is degenerative. No evidence of mitral valve regurgitation. Moderate mitral stenosis. The mean mitral valve gradient is 5.0 mmHg. MVA 1.45 cm^2 by PHT. Moderate mitral annular calcification. 5. Bioprosthetic aortic valve s/p TAVR with 26 mm Edwards Sapien  THV. Mild-moderate perivalvular leakage around 5 o'clock in the parasternal short axis base view. Mean gradient 7 mmHg. 6. The inferior vena cava is dilated in size with >50% respiratory variability, suggesting right atrial pressure of 8 mmHg.  FINDINGS Left Ventricle: Left ventricular ejection fraction, by estimation, is 55%. The left ventricle has normal function. The left ventricle has no regional wall motion abnormalities. The left ventricular internal cavity size was normal in size. There is no left ventricular hypertrophy. Left ventricular diastolic parameters are consistent with Grade I diastolic dysfunction (impaired relaxation).  Right Ventricle: The right ventricular size is mildly enlarged. No increase in right ventricular wall thickness. Right ventricular systolic function is normal. There is mildly elevated pulmonary artery systolic pressure. The tricuspid regurgitant velocity is 2.86 m/s, and with an assumed right atrial pressure of 8 mmHg, the estimated right ventricular systolic pressure is 40.7 mmHg.  Left Atrium: Left atrial size was  mildly dilated.  Right Atrium: Right atrial size was normal in size.  Pericardium: There is no evidence of pericardial effusion.  The mitral valve is degenerative in appearance. There is moderate calcification of the mitral valve leaflet(s). Moderate mitral annular calcification. No evidence of mitral valve regurgitation. Moderate mitral valve stenosis. Tricuspid Valve: The tricuspid valve is normal in structure. Tricuspid valve regurgitation is trivial.  Bioprosthetic aortic valve s/p TAVR with 26 mm Edwards Sapien THV. Mild-moderate perivalvular leakage around 5 o'clock in the parasternal short axis base view. Mean gradient 7 mmHg. Pulmonic Valve: The pulmonic valve was normal in structure. Pulmonic valve regurgitation is not visualized.  Aorta: The aortic root is normal in size and structure.  Venous: The inferior vena cava is dilated in size with greater than 50% respiratory variability, suggesting right atrial pressure of 8 mmHg.  IAS/Shunts: No atrial level shunt detected by color flow Doppler.   LEFT VENTRICLE PLAX 2D LVIDd:         4.90 cm Diastology LVIDs:         2.90 cm LV e' medial:    6.69 cm/s LV PW:         1.00 cm LV E/e' medial:  19.2 LV IVS:        1.00 cm LV e' lateral:   5.66 cm/s LV E/e' lateral: 22.7   RIGHT VENTRICLE             IVC RV Basal diam:  4.50 cm     IVC diam: 1.60 cm RV S prime:     12.00 cm/s TAPSE (M-mode): 2.3 cm  LEFT ATRIUM             Index        RIGHT ATRIUM           Index LA diam:        4.70 cm 2.04 cm/m   RA Area:     14.50 cm LA Vol (A2C):   68.9 ml 29.94 ml/m  RA Volume:   42.20 ml  18.34 ml/m LA Vol (A4C):   66.6 ml 28.94 ml/m LA Biplane Vol: 67.7 ml 29.42 ml/m AORTIC VALVE AV Vmax:           167.00 cm/s AV Vmean:          115.000 cm/s AV VTI:            0.378 m AV Peak Grad:      11.2 mmHg AV Mean Grad:      7.0 mmHg LVOT Vmax:  124.50 cm/s LVOT Vmean:        80.900 cm/s LVOT VTI:          0.282 m LVOT/AV  VTI ratio: 0.75 AI PHT:            610 msec  AORTA Ao Root diam: 3.30 cm Ao Asc diam:  3.40 cm  MITRAL VALVE                TRICUSPID VALVE MV Area (PHT): 2.83 cm     TR Peak grad:   32.7 mmHg MV Peak grad:  10.5 mmHg    TR Vmax:        286.00 cm/s MV Mean grad:  5.0 mmHg MV Vmax:       1.62 m/s     SHUNTS MV Vmean:      102.0 cm/s   Systemic VTI: 0.28 m MV Decel Time: 268 msec MV E velocity: 128.50 cm/s MV A velocity: 150.00 cm/s MV E/A ratio:  0.86  Dalton McleanMD Electronically signed by Ezra Kanner Signature Date/Time: 03/05/2024/4:59:50 PM    Final    MONITORS  LONG TERM MONITOR-LIVE TELEMETRY (3-14 DAYS) 04/10/2023  Narrative Patch Wear Time:  9 days and 16 hours (2025-02-05T11:08:04-498 to 2025-02-15T03:58:36-0500)  Patient had a min HR of 44 bpm, max HR of 160 bpm, and avg HR of 64 bpm. Predominant underlying rhythm was Sinus Rhythm. Bundle Branch Block/IVCD was present. 1 run of Supraventricular Tachycardia occurred lasting 7 beats with a max rate of 160 bpm (avg 145 bpm). Isolated SVEs were rare (<1.0%), SVE Couplets were rare (<1.0%), and no SVE Triplets were present. No Isolated VEs, VE Couplets, or VE Triplets were present.  Summary: sinus rhyth with an average HR of 64 BPM. Rare SVE's and VE's < 1%. No afib or flutter. No high grade AV block. No significant arrhythmias.   CT SCANS  CT CORONARY MORPH W/CTA COR W/SCORE 02/22/2023  Addendum 03/04/2023 12:54 PM ADDENDUM REPORT: 03/04/2023 12:52  EXAM: OVER-READ INTERPRETATION  CT CHEST  The following report is an over-read performed by radiologist Dr. Fonda Mom [facility] on 03/04/2023. This over-read does not include interpretation of cardiac or coronary anatomy or pathology. The coronary CTA interpretation by the cardiologist is attached.  COMPARISON:  02/22/2023.  FINDINGS: Cardiovascular: AV prosthesis. Atheromatous calcifications. See findings discussed in the body of  the report.  Mediastinum/Nodes: No suspicious adenopathy identified. Imaged mediastinal structures are unremarkable.  Lungs/Pleura: Imaged lungs are clear. No pleural effusion or pneumothorax.  Upper Abdomen: Hepatic cysts measuring up to 6 cm.  Musculoskeletal: No chest wall abnormality. No acute osseous findings.  IMPRESSION: No acute extracardiac incidental findings.   Electronically Signed By: Fonda Field M.D. On: 03/04/2023 12:52  Narrative CLINICAL DATA:  Aortic valve replacement (TAVR), pre-op eval  EXAM: Cardiac TAVR CT  TECHNIQUE: The patient was scanned on a Siemens Force 192 slice scanner. A 120 kV retrospective scan was triggered in the descending thoracic aorta at 111 HU's. Gantry rotation speed was 270 msecs and collimation was .9 mm. The 3D data set was reconstructed in 5% intervals of the R-R cycle. Systolic and diastolic phases were analyzed on a dedicated work station using MPR, MIP and VRT modes. The patient received 100mL OMNIPAQUE  IOHEXOL  350 MG/ML SOLN of contrast.  FINDINGS: Aortic Valve:  Tricuspid aortic valve with severely reduced cusp excursion. Severely thickened and severely calcified aortic valve cusps.  AV calcium  score: 3733  Virtual Basal Annulus Measurements:  Maximum/Minimum Diameter: 28.3 x  22.4 mm  Perimeter: 78.6 mm  Area:  473 mm2  Focal LVOT calcifications under LCC.  Membranous septal length: 6.5 mm.  Based on these measurements, the annulus would be suitable for a 26 mm Sapien 3 valve. Alternatively, Heart Team can consider 29 mm Evolut valve. Recommend Heart Team discussion for valve selection.  Sinus of Valsalva Measurements:  Non-coronary:  32 mm  Right - coronary:  31 mm  Left - coronary:  32 mm  Sinus of Valsalva Height:  Left: 24 mm  Right: 23 mm  Aorta: Conventional 3 vessel branch pattern of aortic arch.  Sinotubular Junction:  29 mm  Ascending Thoracic Aorta:  33 mm  Aortic Arch:   27 mm  Proximal descending aorta is mildly dilated 37 mm  Descending Thoracic Aorta:  27 mm  Coronary Artery Height above Annulus:  Left main: 18.3 mm  Right coronary: 16.6 mm  Coronary Arteries: Normal coronary origin. Right dominance. The study was performed without use of NTG and insufficient for plaque evaluation.  Optimum Fluoroscopic Angle for Delivery: LAO 6 CAU 6  OTHER:  Atria: Mild biatrial dilation.  Left atrial appendage: No thrombus.  Mitral valve: Grossly normal, mild mitral annular calcifications.  Pulmonary artery: Normal caliber.  Pulmonary veins: Normal anatomy.  IMPRESSION: 1. Tricuspid aortic valve with severely reduced cusp excursion. Severely thickened and severely calcified aortic valve cusps. 2. Aortic valve calcium  score: 3733 3. Annulus area: 473 mm2, suitable for 26 mm Sapien 3 valve. Focal LVOT calcifications under LCC. Membranous septal length 6.5 mm. 4. Sufficient coronary artery heights from annulus. 5. Optimum fluoroscopic angle for delivery: LAO 6 CAU 6  Electronically Signed: By: Soyla Merck M.D. On: 02/23/2023 14:38     ______________________________________________________________________________________________      EKG:   EKG Interpretation Date/Time:  Thursday March 13 2024 11:26:15 EST Ventricular Rate:  71 PR Interval:  194 QRS Duration:  170 QT Interval:  466 QTC Calculation: 506 R Axis:   -14  Text Interpretation: Normal sinus rhythm Left bundle branch block When compared with ECG of 17-Jul-2023 14:01, PREVIOUS ECG IS PRESENT No significant change was found Confirmed by Wonda Sharper [phone]) on 03/13/2024 11:33:37 AM    Recent Labs: 03/21/2023: Magnesium  1.8 07/17/2023: B Natriuretic Peptide 210.6 07/27/2023: ALT 13; BUN 20; Creatinine, Ser 1.37; Hemoglobin 9.3; NT-Pro BNP 355; Platelets 212; Potassium 4.4; Sodium 139; TSH 2.660  Recent Lipid Panel    Component Value Date/Time   CHOL 209 (H) 07/18/2023  0714   CHOL 154 01/29/2023 0827   TRIG 110 07/18/2023 0714   HDL 61 07/18/2023 0714   HDL 75 01/29/2023 0827   CHOLHDL 3.4 07/18/2023 0714   VLDL 22 07/18/2023 0714   LDLCALC 126 (H) 07/18/2023 0714   LDLCALC 61 01/29/2023 0827     Risk Assessment/Calculations:                Physical Exam:    VS:  BP 130/70 (BP Location: Right Arm, Patient Position: Sitting, Cuff Size: Normal)   Pulse 71   Ht 6' 1 (1.854 m)   Wt 238 lb 9.6 oz (108.2 kg)   SpO2 98%   BMI 31.48 kg/m     Wt Readings from Last 3 Encounters:  03/13/24 238 lb 9.6 oz (108.2 kg)  09/10/23 234 lb 3.2 oz (106.2 kg)  07/27/23 235 lb (106.6 kg)     GEN:  Well nourished, well developed in no acute distress HEENT: Normal NECK: No JVD; No carotid bruits LYMPHATICS:  No lymphadenopathy CARDIAC: RRR, 1/6 ejection murmur at the right upper sternal border and 1/6 diastolic decrescendo murmur at the left lower sternal border RESPIRATORY:  Clear to auscultation without rales, wheezing or rhonchi  ABDOMEN: Soft, non-tender, non-distended MUSCULOSKELETAL:  No edema; No deformity  SKIN: Warm and dry NEUROLOGIC:  Alert and oriented x 3 PSYCHIATRIC:  Normal affect   Assessment & Plan S/P TAVR (transcatheter aortic valve replacement) Recent echo reviewed.  Mean gradient 7 mmHg, mild to moderate paravalvular regurgitation noted.  Continue current management.  Follow-up 1 year.  Discussed with the patient that I think he needs to have a surveillance echocardiogram on a yearly basis with his mild to moderate paravalvular regurgitation.  He appears asymptomatic and has no evidence of hemolysis.  Should be able to follow him with surveillance and I am hopeful that he will not require any intervention. Coronary artery disease involving native coronary artery of native heart with angina pectoris Stable on atorvastatin , clopidogrel , isosorbide , and metoprolol  succinate.  Continue current management. Chronic heart failure with  preserved ejection fraction (HCC) LVEF 55% on recent echo with septal-lateral dyssynergy consistent with left bundle branch block, normal RV function noted.  Appears euvolemic on exam.  Continue current therapy. Nonrheumatic mitral valve stenosis Mean transmitral gradient 5 mmHg on most recent echo.  Recommend annual surveillance as above.  This will allow us  to follow both his aortic valve and mitral valve disease.     Medication Adjustments/Labs and Tests Ordered: Current medicines are reviewed at length with the patient today.  Concerns regarding medicines are outlined above.  Orders Placed This Encounter  Procedures   EKG 12-Lead   ECHOCARDIOGRAM COMPLETE   No orders of the defined types were placed in this encounter.   Patient Instructions  Medication Instructions:  No medication changes were made at this visit. Continue current regimen.   *If you need a refill on your cardiac medications before your next appointment, please call your pharmacy*  Lab Work: None ordered today. If you have labs (blood work) drawn today and your tests are completely normal, you will receive your results only by: MyChart Message (if you have MyChart) OR A paper copy in the mail If you have any lab test that is abnormal or we need to change your treatment, we will call you to review the results.  Testing/Procedures: Your physician has requested that you have an echocardiogram prior to 1 year follow-up appointment. Echocardiography is a painless test that uses sound waves to create images of your heart. It provides your doctor with information about the size and shape of your heart and how well your hearts chambers and valves are working. This procedure takes approximately one hour. There are no restrictions for this procedure. Please do NOT wear cologne, perfume, aftershave, or lotions (deodorant is allowed). Please arrive 15 minutes prior to your appointment time.  Please note: We ask at that you  not bring children with you during ultrasound (echo/ vascular) testing. Due to room size and safety concerns, children are not allowed in the ultrasound rooms during exams. Our front office staff cannot provide observation of children in our lobby area while testing is being conducted. An adult accompanying a patient to their appointment will only be allowed in the ultrasound room at the discretion of the ultrasound technician under special circumstances. We apologize for any inconvenience.   Follow-Up: At [facility], you and your health needs are our priority.  As part of our continuing mission to provide  you with exceptional heart care, our providers are all part of one team.  This team includes your primary Cardiologist (physician) and Advanced Practice Providers or APPs (Physician Assistants and Nurse Practitioners) who all work together to provide you with the care you need, when you need it.  Your next appointment:   1 year(s)  Provider:   Ozell Fell, MD     Signed, Ozell Fell, MD  03/13/2024 11:48 AM    [facility]     [1]  Current Meds  Medication Sig   ASPIRIN  81 PO Take 81 mg by mouth daily. (Patient taking differently: Take 81 mg by mouth as needed.)   atorvastatin  (LIPITOR) 80 MG tablet Take 1 tablet (80 mg total) by mouth daily.   budesonide  (ENTOCORT EC ) 3 MG 24 hr capsule Take 3 capsules by mouth daily.   buPROPion (WELLBUTRIN XL) 150 MG 24 hr tablet Take 150 mg by mouth daily.   butalbital-acetaminophen -caffeine (FIORICET) 50-325-40 MG tablet Take 1 tablet by mouth every 4 (four) hours as needed.   Calcium  Carbonate Antacid (TUMS PO) Take 1 tablet by mouth daily as needed (heartburn, indigestion).   CALCIUM  PO Take 2 tablets by mouth daily.   clopidogrel  (PLAVIX ) 75 MG tablet Take 1 tablet (75 mg total) by mouth daily.   fluticasone (FLONASE) 50 MCG/ACT nasal spray Place 1-2 sprays into both nostrils daily as needed for allergies.   ibuprofen  (ADVIL) 200 MG tablet Take 400-600 mg by mouth daily as needed for headache or moderate pain (pain score 4-6).   Iron -FA-B Cmp-C-Biot-Probiotic (FUSION PLUS) CAPS Take 1 capsule by mouth daily.   isosorbide  mononitrate (IMDUR ) 30 MG 24 hr tablet Take 0.5 tablets (15 mg total) by mouth daily.   metoprolol  succinate (TOPROL -XL) 50 MG 24 hr tablet Take 1 tablet (50 mg total) by mouth daily. Take with or immediately following a meal.   oxybutynin (DITROPAN) 5 MG tablet Take 5 mg by mouth 2 (two) times daily as needed.   pantoprazole  (PROTONIX ) 40 MG tablet TAKE 1 TABLET(40 MG) BY MOUTH DAILY   PARoxetine  (PAXIL ) 10 MG tablet Take 10 mg by mouth every morning.   PARoxetine  (PAXIL ) 20 MG tablet Take 20 mg by mouth daily.   ramelteon (ROZEREM) 8 MG tablet Take 8 mg by mouth.   rosuvastatin  (CRESTOR ) 5 MG tablet Take 5 mg by mouth daily.   triamcinolone  cream (KENALOG) 0.1 % SMARTSIG:1 Application Topical 2-3 Times Daily   [DISCONTINUED] Ferrous Sulfate (IRON  PO) Take 1 tablet by mouth daily.   "

## 2024-03-13 NOTE — Patient Instructions (Signed)
 Medication Instructions:  No medication changes were made at this visit. Continue current regimen.   *If you need a refill on your cardiac medications before your next appointment, please call your pharmacy*  Lab Work: None ordered today. If you have labs (blood work) drawn today and your tests are completely normal, you will receive your results only by: MyChart Message (if you have MyChart) OR A paper copy in the mail If you have any lab test that is abnormal or we need to change your treatment, we will call you to review the results.  Testing/Procedures: Your physician has requested that you have an echocardiogram prior to 1 year follow-up appointment. Echocardiography is a painless test that uses sound waves to create images of your heart. It provides your doctor with information about the size and shape of your heart and how well your heart's chambers and valves are working. This procedure takes approximately one hour. There are no restrictions for this procedure. Please do NOT wear cologne, perfume, aftershave, or lotions (deodorant is allowed). Please arrive 15 minutes prior to your appointment time.  Please note: We ask at that you not bring children with you during ultrasound (echo/ vascular) testing. Due to room size and safety concerns, children are not allowed in the ultrasound rooms during exams. Our front office staff cannot provide observation of children in our lobby area while testing is being conducted. An adult accompanying a patient to their appointment will only be allowed in the ultrasound room at the discretion of the ultrasound technician under special circumstances. We apologize for any inconvenience.   Follow-Up: At [facility], you and your health needs are our priority.  As part of our continuing mission to provide you with exceptional heart care, our providers are all part of one team.  This team includes your primary Cardiologist (physician) and Advanced  Practice Providers or APPs (Physician Assistants and Nurse Practitioners) who all work together to provide you with the care you need, when you need it.  Your next appointment:   1 year(s)  Provider:   Ozell Fell, MD

## 2024-03-13 NOTE — Assessment & Plan Note (Addendum)
 Stable on atorvastatin , clopidogrel , isosorbide , and metoprolol  succinate.  Continue current management.

## 2024-03-14 NOTE — Progress Notes (Signed)
 NYHA class I per Izetta Hummer
